# Patient Record
Sex: Female | Born: 1990
Health system: Southern US, Community
[De-identification: ages and names within clinical notes are randomized; demographics above are authoritative.]

## PROBLEM LIST (undated history)

## (undated) ENCOUNTER — Inpatient Hospital Stay (HOSPITAL_COMMUNITY): Payer: Self-pay

## (undated) ENCOUNTER — Inpatient Hospital Stay (HOSPITAL_COMMUNITY): Admission: RE | Payer: Medicaid Other | Source: Ambulatory Visit

## (undated) ENCOUNTER — Emergency Department: Admission: EM | Payer: Self-pay | Source: Home / Self Care

## (undated) DIAGNOSIS — Z8619 Personal history of other infectious and parasitic diseases: Secondary | ICD-10-CM

## (undated) DIAGNOSIS — O24419 Gestational diabetes mellitus in pregnancy, unspecified control: Secondary | ICD-10-CM

## (undated) DIAGNOSIS — Z202 Contact with and (suspected) exposure to infections with a predominantly sexual mode of transmission: Secondary | ICD-10-CM

## (undated) DIAGNOSIS — N760 Acute vaginitis: Secondary | ICD-10-CM

## (undated) DIAGNOSIS — B379 Candidiasis, unspecified: Secondary | ICD-10-CM

## (undated) DIAGNOSIS — N390 Urinary tract infection, site not specified: Secondary | ICD-10-CM

## (undated) DIAGNOSIS — N83209 Unspecified ovarian cyst, unspecified side: Secondary | ICD-10-CM

## (undated) DIAGNOSIS — R519 Headache, unspecified: Secondary | ICD-10-CM

## (undated) DIAGNOSIS — R87629 Unspecified abnormal cytological findings in specimens from vagina: Secondary | ICD-10-CM

## (undated) DIAGNOSIS — O020 Blighted ovum and nonhydatidiform mole: Secondary | ICD-10-CM

## (undated) DIAGNOSIS — B9689 Other specified bacterial agents as the cause of diseases classified elsewhere: Secondary | ICD-10-CM

## (undated) DIAGNOSIS — E282 Polycystic ovarian syndrome: Secondary | ICD-10-CM

## (undated) DIAGNOSIS — Z9889 Other specified postprocedural states: Secondary | ICD-10-CM

## (undated) HISTORY — DX: Gestational diabetes mellitus in pregnancy, unspecified control: O24.419

## (undated) HISTORY — PX: OTHER SURGICAL HISTORY: SHX169

## (undated) HISTORY — PX: LIPOSUCTION TRUNK: SUR833

## (undated) HISTORY — PX: DILATION AND CURETTAGE OF UTERUS: SHX78

---
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
## 2010-02-16 ENCOUNTER — Emergency Department (HOSPITAL_COMMUNITY)
Admission: EM | Admit: 2010-02-16 | Discharge: 2010-02-16 | Payer: Self-pay | Source: Home / Self Care | Admitting: Emergency Medicine

## 2010-02-19 ENCOUNTER — Inpatient Hospital Stay (HOSPITAL_COMMUNITY)
Admission: AD | Admit: 2010-02-19 | Discharge: 2010-02-19 | Payer: Self-pay | Source: Home / Self Care | Admitting: Obstetrics and Gynecology

## 2010-02-19 ENCOUNTER — Ambulatory Visit: Payer: Self-pay | Admitting: Obstetrics and Gynecology

## 2010-03-03 ENCOUNTER — Ambulatory Visit: Payer: Self-pay | Admitting: Nurse Practitioner

## 2010-03-03 ENCOUNTER — Inpatient Hospital Stay (HOSPITAL_COMMUNITY)
Admission: EM | Admit: 2010-03-03 | Discharge: 2010-03-03 | Payer: Self-pay | Source: Home / Self Care | Admitting: Obstetrics and Gynecology

## 2010-03-15 ENCOUNTER — Emergency Department (HOSPITAL_COMMUNITY)
Admission: EM | Admit: 2010-03-15 | Discharge: 2010-03-15 | Payer: Self-pay | Source: Home / Self Care | Admitting: Emergency Medicine

## 2010-03-29 ENCOUNTER — Inpatient Hospital Stay (HOSPITAL_COMMUNITY)
Admission: AD | Admit: 2010-03-29 | Discharge: 2010-03-29 | Payer: Self-pay | Source: Home / Self Care | Admitting: Obstetrics & Gynecology

## 2010-04-21 ENCOUNTER — Inpatient Hospital Stay (HOSPITAL_COMMUNITY)
Admission: AD | Admit: 2010-04-21 | Discharge: 2010-04-21 | Payer: Self-pay | Source: Home / Self Care | Attending: Obstetrics | Admitting: Obstetrics

## 2010-05-01 ENCOUNTER — Inpatient Hospital Stay (HOSPITAL_COMMUNITY)
Admission: AD | Admit: 2010-05-01 | Discharge: 2010-05-01 | Payer: Self-pay | Source: Home / Self Care | Attending: Obstetrics | Admitting: Obstetrics

## 2010-06-20 ENCOUNTER — Emergency Department (HOSPITAL_COMMUNITY): Payer: Medicaid Other

## 2010-06-20 ENCOUNTER — Emergency Department (HOSPITAL_COMMUNITY)
Admission: EM | Admit: 2010-06-20 | Discharge: 2010-06-21 | Disposition: A | Discharge status: Other Acute Inpatient Hospital | Payer: Medicaid Other | Source: Home / Self Care | Attending: Emergency Medicine | Admitting: Emergency Medicine

## 2010-06-20 DIAGNOSIS — M79609 Pain in unspecified limb: Secondary | ICD-10-CM | POA: Insufficient documentation

## 2010-06-20 DIAGNOSIS — O9989 Other specified diseases and conditions complicating pregnancy, childbirth and the puerperium: Secondary | ICD-10-CM | POA: Insufficient documentation

## 2010-06-20 DIAGNOSIS — R109 Unspecified abdominal pain: Secondary | ICD-10-CM | POA: Insufficient documentation

## 2010-06-20 DIAGNOSIS — R079 Chest pain, unspecified: Secondary | ICD-10-CM | POA: Insufficient documentation

## 2010-06-20 DIAGNOSIS — F411 Generalized anxiety disorder: Secondary | ICD-10-CM | POA: Insufficient documentation

## 2010-06-20 LAB — COMPREHENSIVE METABOLIC PANEL
ALT: 24 U/L (ref 0–35)
AST: 25 U/L (ref 0–37)
Albumin: 3.1 g/dL — ABNORMAL LOW (ref 3.5–5.2)
CO2: 21 mEq/L (ref 19–32)
Chloride: 108 mEq/L (ref 96–112)
Creatinine, Ser: 0.52 mg/dL (ref 0.4–1.2)
GFR calc Af Amer: >60 mL/min (ref >60)
GFR calc non Af Amer: >60 mL/min (ref >60)
Potassium: 3.2 mEq/L — ABNORMAL LOW (ref 3.5–5.1)
Sodium: 136 mEq/L (ref 135–145)
Total Bilirubin: 0.3 mg/dL (ref 0.3–1.2)

## 2010-06-20 LAB — URINALYSIS, ROUTINE W REFLEX MICROSCOPIC
Bilirubin Urine: NEGATIVE
Protein, ur: NEGATIVE mg/dL
Urine Glucose, Fasting: NEGATIVE mg/dL
Urobilinogen, UA: 0.2 mg/dL (ref 0.0–1.0)

## 2010-06-20 LAB — DIFFERENTIAL
Basophils Absolute: 0 10*3/uL (ref 0.0–0.1)
Basophils Relative: 0 % (ref 0–1)
Eosinophils Absolute: 0.1 10*3/uL (ref 0.0–0.7)
Monocytes Relative: 8 % (ref 3–12)
Neutro Abs: 8.2 10*3/uL — ABNORMAL HIGH (ref 1.7–7.7)
Neutrophils Relative %: 67 % (ref 43–77)

## 2010-06-20 LAB — URINE MICROSCOPIC-ADD ON

## 2010-06-20 LAB — LACTIC ACID, PLASMA: Lactic Acid, Venous: 1.2 mmol/L (ref 0.5–2.2)

## 2010-06-20 LAB — CBC
Hemoglobin: 11.3 g/dL — ABNORMAL LOW (ref 12.0–15.0)
MCH: 28.2 pg (ref 26.0–34.0)
Platelets: 256 10*3/uL (ref 150–400)
RBC: 4.01 MIL/uL (ref 3.87–5.11)
WBC: 12.3 10*3/uL — ABNORMAL HIGH (ref 4.0–10.5)

## 2010-06-21 ENCOUNTER — Inpatient Hospital Stay (HOSPITAL_COMMUNITY)
Admission: AD | Admit: 2010-06-21 | Discharge: 2010-06-21 | Disposition: A | Discharge status: Home / Self Care | Payer: Medicaid Other | Source: Ambulatory Visit | Attending: Obstetrics | Admitting: Obstetrics

## 2010-06-21 ENCOUNTER — Inpatient Hospital Stay (HOSPITAL_COMMUNITY): Admission: AD | Admit: 2010-06-21 | Payer: Self-pay | Source: Ambulatory Visit | Admitting: Obstetrics

## 2010-06-21 DIAGNOSIS — R109 Unspecified abdominal pain: Secondary | ICD-10-CM

## 2010-06-21 DIAGNOSIS — O99891 Other specified diseases and conditions complicating pregnancy: Secondary | ICD-10-CM | POA: Insufficient documentation

## 2010-06-21 DIAGNOSIS — Y92410 Unspecified street and highway as the place of occurrence of the external cause: Secondary | ICD-10-CM | POA: Insufficient documentation

## 2010-06-21 DIAGNOSIS — M549 Dorsalgia, unspecified: Secondary | ICD-10-CM

## 2010-06-21 DIAGNOSIS — O9989 Other specified diseases and conditions complicating pregnancy, childbirth and the puerperium: Secondary | ICD-10-CM

## 2010-06-21 DIAGNOSIS — M542 Cervicalgia: Secondary | ICD-10-CM

## 2010-06-21 LAB — TYPE AND SCREEN: ABO/RH(D): A POS

## 2010-06-23 ENCOUNTER — Inpatient Hospital Stay (HOSPITAL_COMMUNITY)
Admission: AD | Admit: 2010-06-23 | Discharge: 2010-06-24 | Disposition: A | Discharge status: Home / Self Care | Payer: Medicaid Other | Source: Ambulatory Visit | Attending: Obstetrics & Gynecology | Admitting: Obstetrics & Gynecology

## 2010-06-23 ENCOUNTER — Inpatient Hospital Stay (HOSPITAL_COMMUNITY): Payer: Medicaid Other

## 2010-06-23 DIAGNOSIS — R109 Unspecified abdominal pain: Secondary | ICD-10-CM | POA: Insufficient documentation

## 2010-06-23 DIAGNOSIS — O9989 Other specified diseases and conditions complicating pregnancy, childbirth and the puerperium: Secondary | ICD-10-CM

## 2010-06-23 DIAGNOSIS — O99891 Other specified diseases and conditions complicating pregnancy: Secondary | ICD-10-CM | POA: Insufficient documentation

## 2010-06-23 LAB — URINE MICROSCOPIC-ADD ON

## 2010-06-23 LAB — URINALYSIS, ROUTINE W REFLEX MICROSCOPIC
Specific Gravity, Urine: 1.02 (ref 1.005–1.030)
Urine Glucose, Fasting: NEGATIVE mg/dL

## 2010-06-24 LAB — URINE CULTURE

## 2010-06-26 IMAGING — CT CT ABD-PELV W/O CM
2 of 4 series · 17 of 46 positions shown, 19 images · non-contrast
Comparison: None.

CLINICAL DATA: Abdominal pain.  Diarrhea. Reported history of
polycystic ovarian disease

CT ABDOMEN AND PELVIS WITHOUT CONTRAST
TECHNIQUE: Multidetector CT imaging of the abdomen and pelvis was
performed following the standard protocol without intravenous
contrast.

[Series 2: abd/pelv w/o 5.0 b31f st · axial · non-contrast · 0.83mm/px · z∈[-576,-176]mm · 14 of 88 slices shown, 16 images]
[im 4/88  soft-tissue]
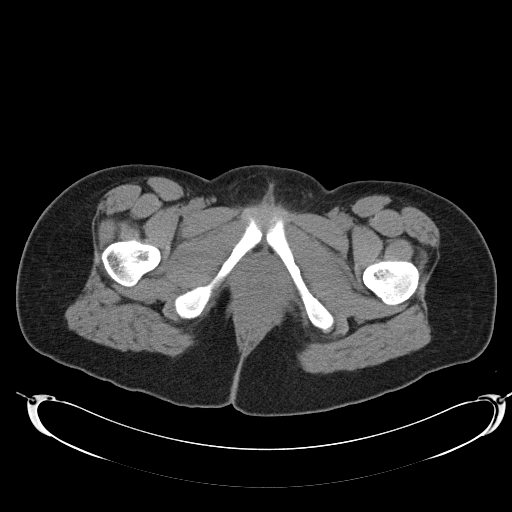
[im 4/88  bone]
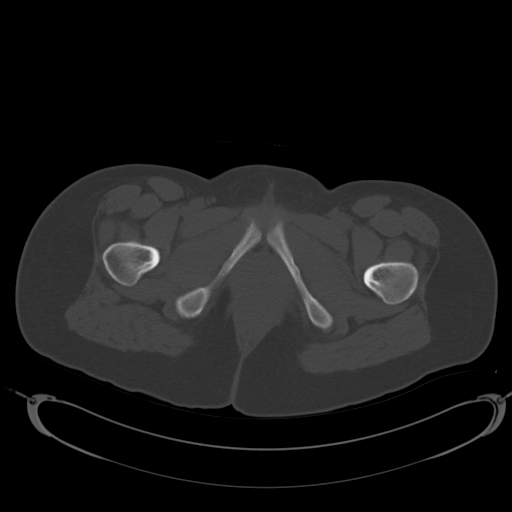
[im 11/88  soft-tissue]
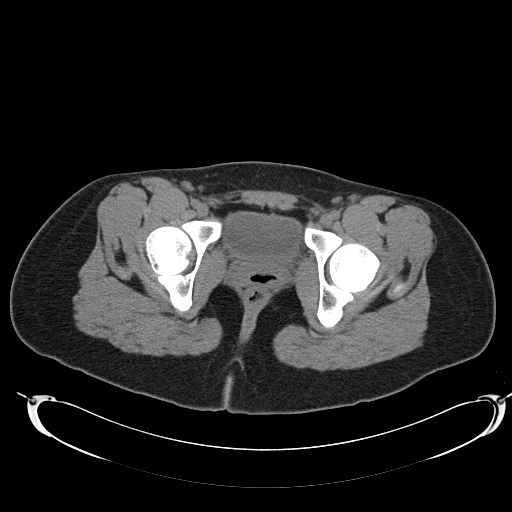
[im 18/88  soft-tissue]
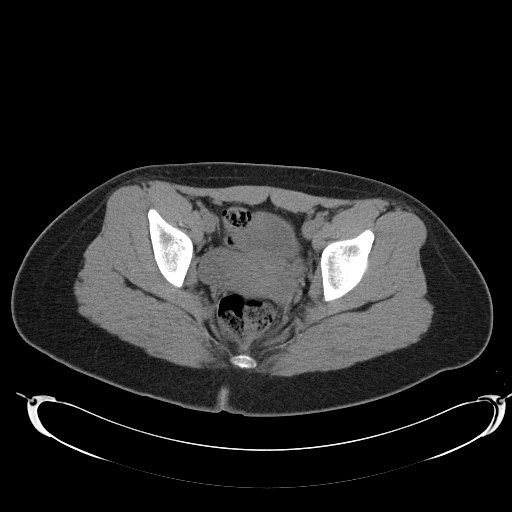
[im 25/88  soft-tissue]
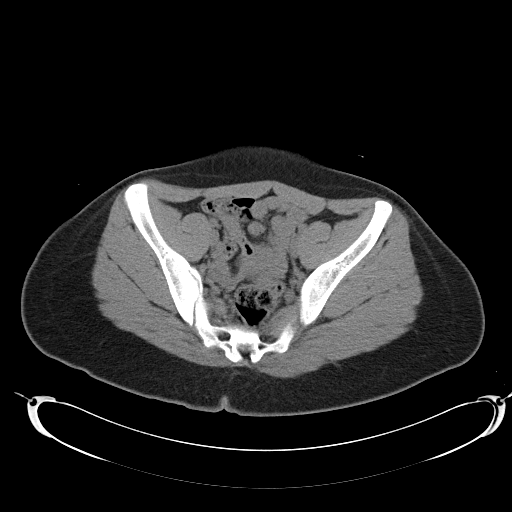
[im 28/88  soft-tissue]
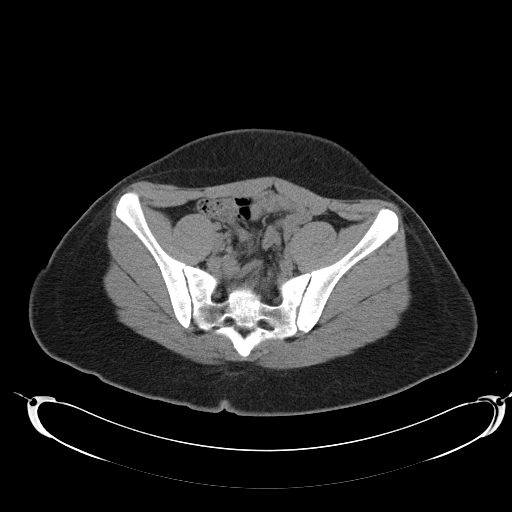
[im 35/88  soft-tissue]
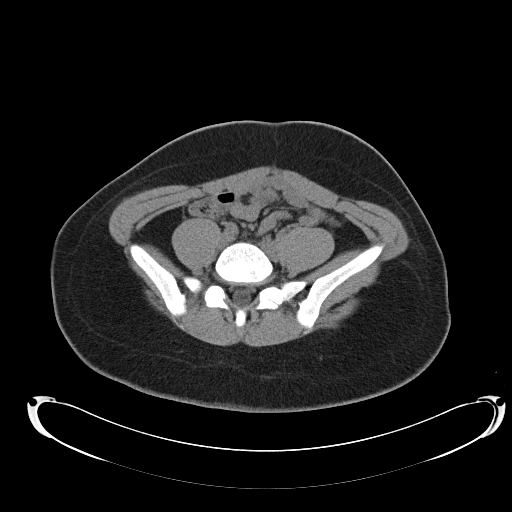
[im 42/88  soft-tissue]
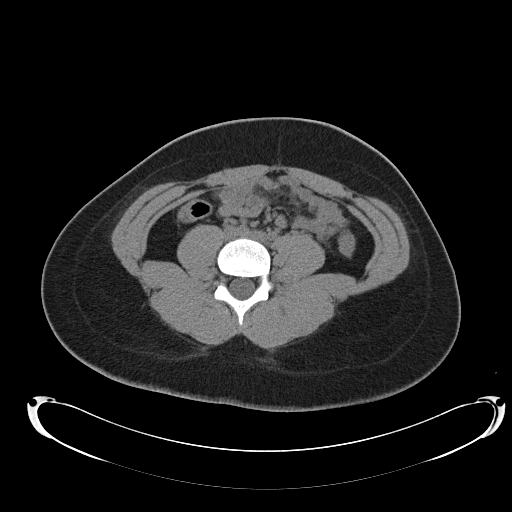
[im 46/88  soft-tissue]
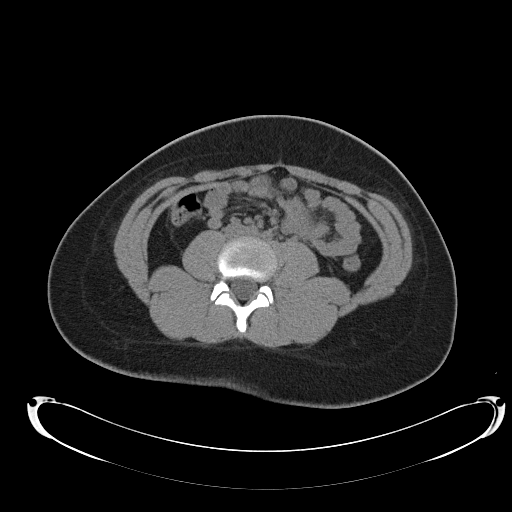
[im 53/88  soft-tissue]
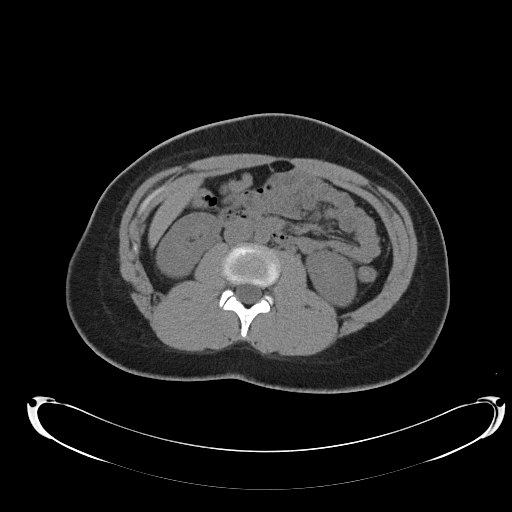
[im 53/88  bone]
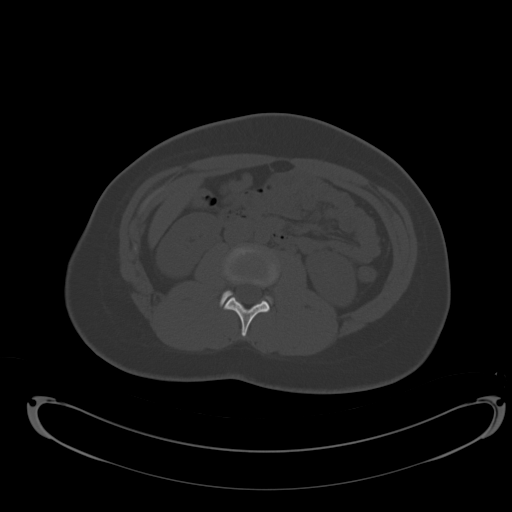
[im 60/88  soft-tissue]
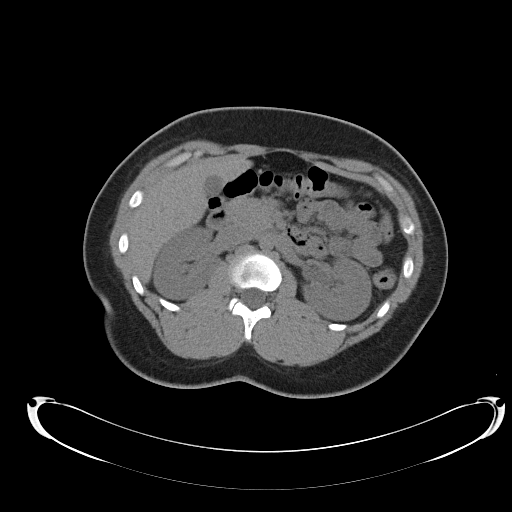
[im 67/88  soft-tissue]
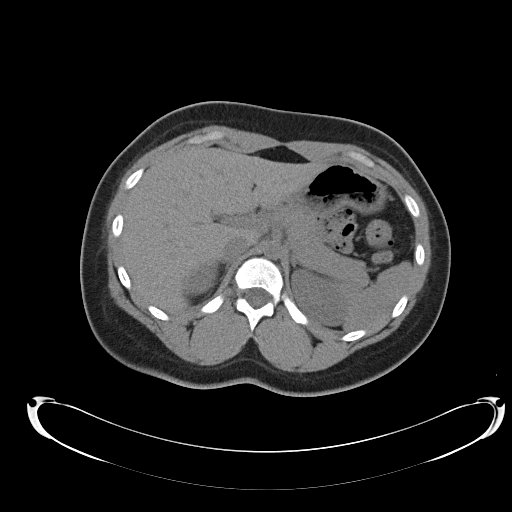
[im 70/88  soft-tissue]
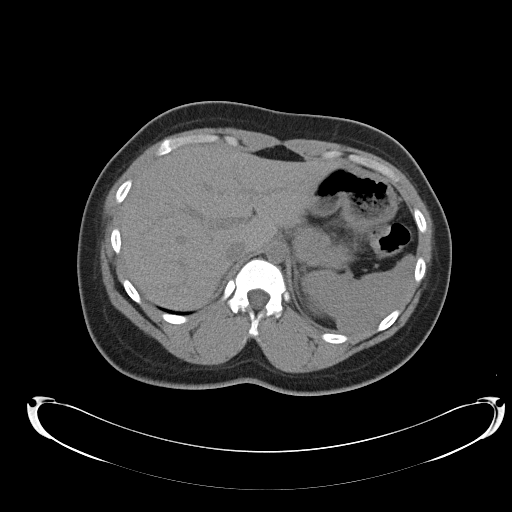
[im 77/88  soft-tissue]
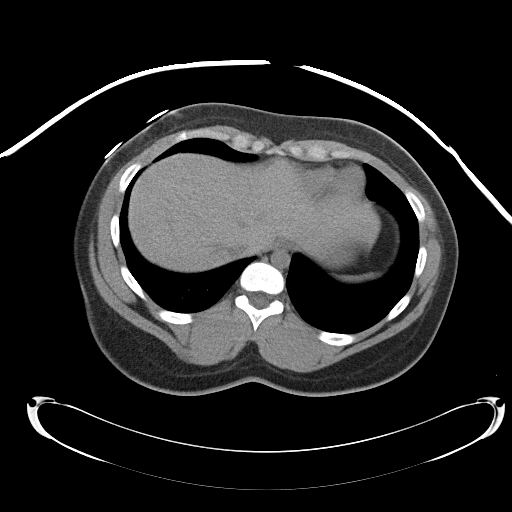
[im 84/88  soft-tissue]
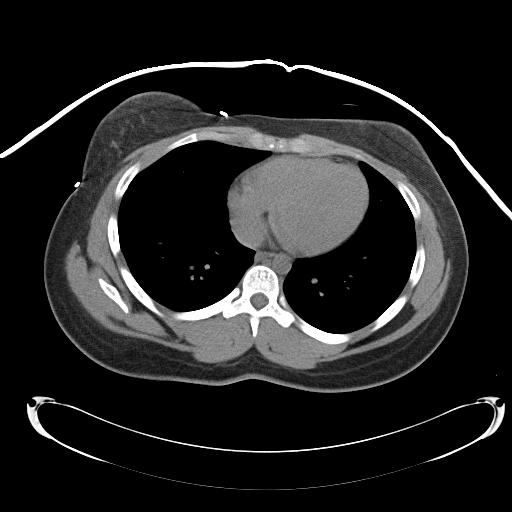

[Series 5: abd/pelv w/o 2.0 spo cor thins · coronal · non-contrast · 1.04mm/px · 3 of 98 slices shown]
[im 33/98  soft-tissue]
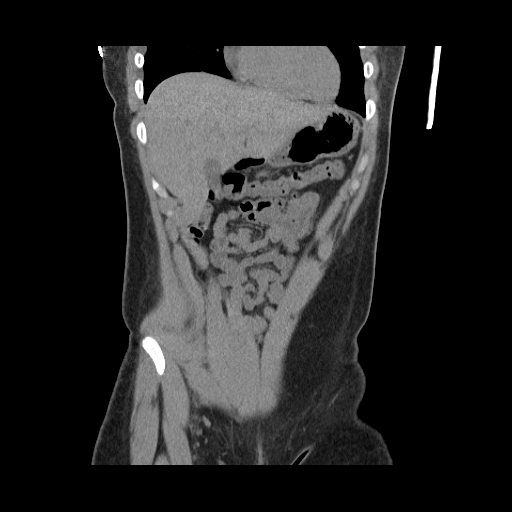
[im 44/98  soft-tissue]
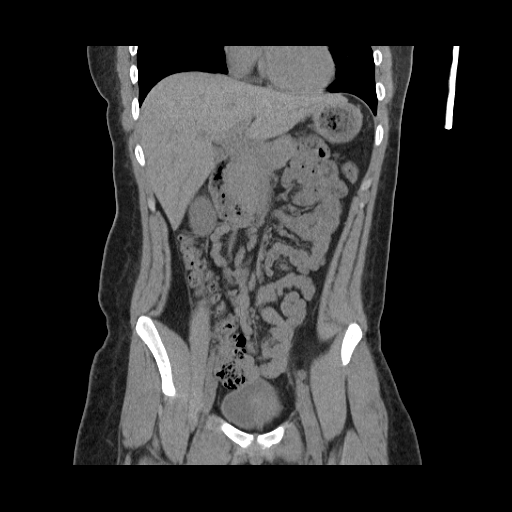
[im 54/98  soft-tissue]
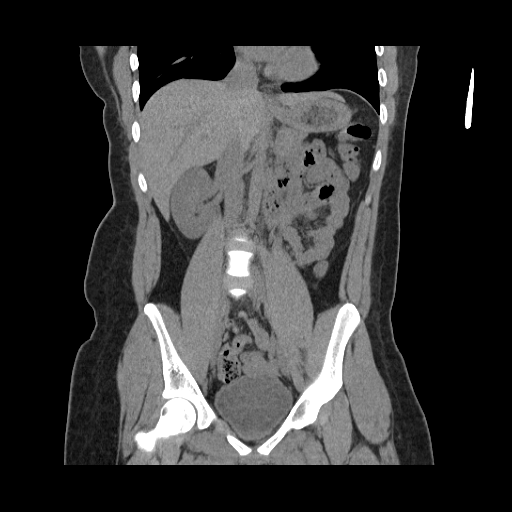

[17 of 46 positions shown; findings below may reference images not displayed]

FINDINGS: Normal liver, spleen, pancreas, adrenal glands.

No focal renal abnormality.  No free fluid.  No mass. No evidence
of bowel wall thickening.  Suggestion that both ovaries have
somewhat of a prominent globular like configuration with possibly
multiple follicles raises a question of polycystic ovarian
syndrome.  No free pelvic fluid.  Uterus unremarkable.  Bladder
unremarkable.

Bony structures intact.
IMPRESSION: No acute findings.  Findings raise a question of polycystic ovarian
syndrome.

## 2010-07-11 ENCOUNTER — Inpatient Hospital Stay (HOSPITAL_COMMUNITY)
Admission: AD | Admit: 2010-07-11 | Discharge: 2010-07-11 | Disposition: A | Discharge status: Home / Self Care | Payer: Medicaid Other | Source: Ambulatory Visit | Attending: Obstetrics | Admitting: Obstetrics

## 2010-07-11 DIAGNOSIS — O368190 Decreased fetal movements, unspecified trimester, not applicable or unspecified: Secondary | ICD-10-CM | POA: Insufficient documentation

## 2010-07-11 LAB — URINALYSIS, ROUTINE W REFLEX MICROSCOPIC
Glucose, UA: NEGATIVE mg/dL
Specific Gravity, Urine: 1.02 (ref 1.005–1.030)

## 2010-07-11 LAB — URINE MICROSCOPIC-ADD ON

## 2010-07-21 LAB — URINALYSIS, ROUTINE W REFLEX MICROSCOPIC
Bilirubin Urine: NEGATIVE
Ketones, ur: NEGATIVE mg/dL
Nitrite: NEGATIVE
Urobilinogen, UA: 1 mg/dL (ref 0.0–1.0)
pH: 6.5 (ref 5.0–8.0)

## 2010-07-21 LAB — CBC
Hemoglobin: 11.8 g/dL — ABNORMAL LOW (ref 12.0–15.0)
MCH: 28.1 pg (ref 26.0–34.0)
MCHC: 33.7 g/dL (ref 30.0–36.0)
MCV: 83.3 fL (ref 78.0–100.0)

## 2010-07-21 LAB — URINE CULTURE

## 2010-07-21 LAB — URINE MICROSCOPIC-ADD ON

## 2010-07-21 LAB — WET PREP, GENITAL

## 2010-07-22 LAB — URINALYSIS, ROUTINE W REFLEX MICROSCOPIC
Bilirubin Urine: NEGATIVE
Glucose, UA: NEGATIVE mg/dL
Ketones, ur: NEGATIVE mg/dL
Nitrite: NEGATIVE
Nitrite: NEGATIVE
Protein, ur: NEGATIVE mg/dL
Specific Gravity, Urine: 1.028 (ref 1.005–1.030)
Specific Gravity, Urine: 1.02 (ref 1.005–1.030)
Urobilinogen, UA: 0.2 mg/dL (ref 0.0–1.0)
pH: 6 (ref 5.0–8.0)

## 2010-07-22 LAB — URINE MICROSCOPIC-ADD ON

## 2010-07-22 LAB — URINE CULTURE
Colony Count: 75000
Culture  Setup Time: 201111191713

## 2010-07-23 LAB — URINE CULTURE
Colony Count: 80000
Culture  Setup Time: 201110250035

## 2010-07-23 LAB — URINE MICROSCOPIC-ADD ON

## 2010-07-23 LAB — URINALYSIS, ROUTINE W REFLEX MICROSCOPIC
Bilirubin Urine: NEGATIVE
Ketones, ur: NEGATIVE mg/dL
Nitrite: NEGATIVE
Protein, ur: NEGATIVE mg/dL
Urobilinogen, UA: 0.2 mg/dL (ref 0.0–1.0)

## 2010-07-23 LAB — GC/CHLAMYDIA PROBE AMP, GENITAL: GC Probe Amp, Genital: NEGATIVE

## 2010-07-23 LAB — WET PREP, GENITAL: Trich, Wet Prep: NONE SEEN

## 2010-07-24 LAB — HCG, QUANTITATIVE, PREGNANCY: hCG, Beta Chain, Quant, S: 4810 m[IU]/mL — ABNORMAL HIGH (ref <5)

## 2010-07-24 LAB — CBC
Hemoglobin: 13.2 g/dL (ref 12.0–15.0)
MCH: 29.5 pg (ref 26.0–34.0)
MCHC: 33.3 g/dL (ref 30.0–36.0)
MCV: 88.5 fL (ref 78.0–100.0)

## 2010-07-24 LAB — URINALYSIS, ROUTINE W REFLEX MICROSCOPIC
Glucose, UA: NEGATIVE mg/dL
Nitrite: NEGATIVE
Protein, ur: NEGATIVE mg/dL
pH: 6 (ref 5.0–8.0)

## 2010-07-24 LAB — WET PREP, GENITAL: Yeast Wet Prep HPF POC: NONE SEEN

## 2010-07-24 LAB — URINE CULTURE
Colony Count: 40000
Culture  Setup Time: 201110130153

## 2010-07-24 LAB — GC/CHLAMYDIA PROBE AMP, GENITAL
Chlamydia, DNA Probe: NEGATIVE
GC Probe Amp, Genital: NEGATIVE

## 2010-07-24 LAB — URINE MICROSCOPIC-ADD ON

## 2010-07-27 LAB — DIFFERENTIAL
Basophils Absolute: 0.1 10*3/uL (ref 0.0–0.1)
Basophils Relative: 1 % (ref 0–1)
Eosinophils Relative: 2 % (ref 0–5)
Monocytes Absolute: 0.9 10*3/uL (ref 0.1–1.0)
Monocytes Relative: 10 % (ref 3–12)
Neutro Abs: 5.1 10*3/uL (ref 1.7–7.7)

## 2010-07-27 LAB — CBC
Platelets: 337 10*3/uL (ref 150–400)
RDW: 16.4 % — ABNORMAL HIGH (ref 11.5–15.5)
WBC: 8.9 10*3/uL (ref 4.0–10.5)

## 2010-07-27 LAB — COMPREHENSIVE METABOLIC PANEL
AST: 16 U/L (ref 0–37)
Albumin: 3.6 g/dL (ref 3.5–5.2)
Alkaline Phosphatase: 86 U/L (ref 39–117)
Chloride: 104 mEq/L (ref 96–112)
GFR calc Af Amer: >60 mL/min (ref >60)
Potassium: 4 mEq/L (ref 3.5–5.1)
Sodium: 135 mEq/L (ref 135–145)
Total Bilirubin: 0.4 mg/dL (ref 0.3–1.2)
Total Protein: 7.1 g/dL (ref 6.0–8.3)

## 2010-07-27 LAB — POCT PREGNANCY, URINE: Preg Test, Ur: NEGATIVE

## 2010-07-28 LAB — POCT URINALYSIS DIP (DEVICE)
Glucose, UA: NEGATIVE mg/dL
Ketones, ur: NEGATIVE mg/dL
Protein, ur: NEGATIVE mg/dL
Specific Gravity, Urine: 1.02 (ref 1.005–1.030)
Urobilinogen, UA: 0.2 mg/dL (ref 0.0–1.0)

## 2010-07-28 LAB — WET PREP, GENITAL
Trich, Wet Prep: NONE SEEN
Yeast Wet Prep HPF POC: NONE SEEN

## 2010-07-28 LAB — POCT PREGNANCY, URINE: Preg Test, Ur: NEGATIVE

## 2010-08-03 LAB — URINALYSIS, ROUTINE W REFLEX MICROSCOPIC
Glucose, UA: NEGATIVE mg/dL
Ketones, ur: NEGATIVE mg/dL
Leukocytes, UA: NEGATIVE
Nitrite: NEGATIVE
Specific Gravity, Urine: 1.027 (ref 1.005–1.030)
pH: 7 (ref 5.0–8.0)

## 2010-08-03 LAB — URINE MICROSCOPIC-ADD ON

## 2010-08-03 LAB — POCT PREGNANCY, URINE: Preg Test, Ur: NEGATIVE

## 2010-08-13 LAB — WET PREP, GENITAL
Trich, Wet Prep: NONE SEEN
Yeast Wet Prep HPF POC: NONE SEEN

## 2010-08-13 LAB — URINE CULTURE: Colony Count: 15000

## 2010-08-13 LAB — POCT URINALYSIS DIP (DEVICE)
Ketones, ur: NEGATIVE mg/dL
Protein, ur: NEGATIVE mg/dL
Urobilinogen, UA: 0.2 mg/dL (ref 0.0–1.0)
pH: 7.5 (ref 5.0–8.0)

## 2010-08-13 LAB — POCT PREGNANCY, URINE: Preg Test, Ur: NEGATIVE

## 2010-08-13 LAB — GC/CHLAMYDIA PROBE AMP, GENITAL: GC Probe Amp, Genital: NEGATIVE

## 2010-08-15 LAB — GLUCOSE, CAPILLARY: Glucose-Capillary: 63 mg/dL — ABNORMAL LOW (ref 70–99)

## 2010-08-15 LAB — URINE MICROSCOPIC-ADD ON

## 2010-08-15 LAB — POCT I-STAT, CHEM 8
HCT: 38 % (ref 36.0–46.0)
Hemoglobin: 12.9 g/dL (ref 12.0–15.0)
Potassium: 3.8 mEq/L (ref 3.5–5.1)
Sodium: 137 mEq/L (ref 135–145)
TCO2: 24 mmol/L (ref 0–100)

## 2010-08-15 LAB — URINALYSIS, ROUTINE W REFLEX MICROSCOPIC
Bilirubin Urine: NEGATIVE
Hgb urine dipstick: NEGATIVE
Ketones, ur: NEGATIVE mg/dL
Protein, ur: NEGATIVE mg/dL
Specific Gravity, Urine: 1.029 (ref 1.005–1.030)
Urobilinogen, UA: 0.2 mg/dL (ref 0.0–1.0)
pH: 6.5 (ref 5.0–8.0)

## 2010-08-15 LAB — POCT PREGNANCY, URINE: Preg Test, Ur: NEGATIVE

## 2010-08-16 LAB — WET PREP, GENITAL
Trich, Wet Prep: NONE SEEN
Yeast Wet Prep HPF POC: NONE SEEN

## 2010-08-16 LAB — URINALYSIS, ROUTINE W REFLEX MICROSCOPIC
Bilirubin Urine: NEGATIVE
Glucose, UA: NEGATIVE mg/dL
Ketones, ur: NEGATIVE mg/dL
Nitrite: NEGATIVE
Protein, ur: NEGATIVE mg/dL

## 2010-08-16 LAB — GC/CHLAMYDIA PROBE AMP, GENITAL: Chlamydia, DNA Probe: NEGATIVE

## 2010-08-16 LAB — URINE CULTURE: Colony Count: 70000

## 2010-08-16 LAB — URINE MICROSCOPIC-ADD ON

## 2010-08-17 LAB — RAPID STREP SCREEN (MED CTR MEBANE ONLY): Streptococcus, Group A Screen (Direct): NEGATIVE

## 2010-08-19 LAB — WET PREP, GENITAL

## 2010-08-19 LAB — CBC
MCV: 87.3 fL (ref 78.0–100.0)
Platelets: 339 10*3/uL (ref 150–400)
RBC: 3.74 MIL/uL — ABNORMAL LOW (ref 3.87–5.11)
WBC: 6.6 10*3/uL (ref 4.0–10.5)

## 2010-08-19 LAB — GC/CHLAMYDIA PROBE AMP, GENITAL
Chlamydia, DNA Probe: NEGATIVE
GC Probe Amp, Genital: NEGATIVE

## 2010-08-20 ENCOUNTER — Inpatient Hospital Stay (HOSPITAL_COMMUNITY)
Admission: AD | Admit: 2010-08-20 | Discharge: 2010-08-21 | Disposition: A | Discharge status: Home / Self Care | Payer: Medicaid Other | Source: Ambulatory Visit | Attending: Obstetrics | Admitting: Obstetrics

## 2010-08-20 DIAGNOSIS — O9989 Other specified diseases and conditions complicating pregnancy, childbirth and the puerperium: Secondary | ICD-10-CM

## 2010-08-20 DIAGNOSIS — M549 Dorsalgia, unspecified: Secondary | ICD-10-CM | POA: Insufficient documentation

## 2010-08-20 DIAGNOSIS — O99891 Other specified diseases and conditions complicating pregnancy: Secondary | ICD-10-CM | POA: Insufficient documentation

## 2010-08-20 LAB — URINALYSIS, ROUTINE W REFLEX MICROSCOPIC
Bilirubin Urine: NEGATIVE
Leukocytes, UA: NEGATIVE
Nitrite: NEGATIVE
Specific Gravity, Urine: 1.015 (ref 1.005–1.030)
Urobilinogen, UA: 0.2 mg/dL (ref 0.0–1.0)
pH: 7 (ref 5.0–8.0)

## 2010-08-20 LAB — URINE MICROSCOPIC-ADD ON

## 2010-08-20 LAB — WET PREP, GENITAL: Yeast Wet Prep HPF POC: NONE SEEN

## 2010-08-21 LAB — GC/CHLAMYDIA PROBE AMP, GENITAL
Chlamydia, DNA Probe: NEGATIVE
GC Probe Amp, Genital: NEGATIVE

## 2010-09-09 ENCOUNTER — Inpatient Hospital Stay (HOSPITAL_COMMUNITY)
Admission: AD | Admit: 2010-09-09 | Discharge: 2010-09-10 | Disposition: A | Discharge status: Home / Self Care | Payer: Medicaid Other | Source: Ambulatory Visit | Attending: Obstetrics | Admitting: Obstetrics

## 2010-09-09 DIAGNOSIS — O99891 Other specified diseases and conditions complicating pregnancy: Secondary | ICD-10-CM | POA: Insufficient documentation

## 2010-09-09 DIAGNOSIS — R109 Unspecified abdominal pain: Secondary | ICD-10-CM

## 2010-09-09 DIAGNOSIS — O9989 Other specified diseases and conditions complicating pregnancy, childbirth and the puerperium: Secondary | ICD-10-CM

## 2010-09-10 LAB — URINALYSIS, ROUTINE W REFLEX MICROSCOPIC
Nitrite: NEGATIVE
Protein, ur: NEGATIVE mg/dL
Specific Gravity, Urine: 1.02 (ref 1.005–1.030)
Urobilinogen, UA: 0.2 mg/dL (ref 0.0–1.0)

## 2010-09-10 LAB — WET PREP, GENITAL: Yeast Wet Prep HPF POC: NONE SEEN

## 2010-09-10 LAB — URINE MICROSCOPIC-ADD ON

## 2010-09-23 NOTE — Group Therapy Note (Signed)
NAME:  Anita Allen, Anita Allen               ACCOUNT NO.:  [id number]   MEDICAL RECORD NO.:  [id number]          PATIENT TYPE:  WOC   LOCATION:  [facility]                   FACILITY:  [facility]   PHYSICIAN:  Jaynie Collins, MD     DATE OF BIRTH:  Jan 08, 1991   DATE OF SERVICE:                                  CLINIC NOTE   The patient is an 20 year old gravida 1, para 0-0-1-0, who is here for  followup after a visit on October 19, 2008, for abdominal pain and  irregular menstrual cycles.  She did have an ultrasound on May 2010,  which showed polycystic ovaries and given that she does have these  polycystic ovaries and irregular menstrual periods and on physical  examination has signs of hyperandrogenism, she was diagnosed with  polycystic ovarian syndrome.  The patient was given options of how to  treat her PCOS and she is very against using any form of hormonal  therapy and so she was started on metformin 500 b.i.d.  A comprehensive  metabolic panel that was done on that day was negative, and her liver  function tests were normal.  On followup today, the patient does report  continued pain.  She is also requesting a physical examination for her  pain and for further workup.  She does report a history of Chlamydia in  the past and wants to see if this has recurred.   No interval change in medical history.   PHYSICAL EXAMINATION:  GENERAL:  The patient is afebrile.  No apparent  distress.  VITAL SIGNS:  Stable as written in her physical chart.  ABDOMEN:  Soft, nontender, nondistended.  PELVIC:  Normal external female genitalia.  Pink, well-rugated vagina,  small amount of thin white discharge noted, no odor.  A sample was taken  for wet prep.  Her cervix was noted to be friable and erythematous.  No  active discharge was noted.  A gonorrhea and Chlamydia probe were  obtained.  On bimanual exam, the patient does endorse cervical motion  tenderness that she rates at 10/10.  However, there is no  chandelier  sign or any signs of extreme CMP, she just appeared uncomfortable during  the examination on the uterine and adnexal palpation, no abnormal masses  palpated.   ASSESSMENT AND PLAN:  The patient is an 20 year old gravida 1, para 0-0-  1-0 with polycystic ovarian syndrome and pelvic pain.  The patient was  counseled about using hormonal therapy, which helped the polycystic  ovarian syndrome and her pain, but she declines this method.  She wants  to continue on the metformin for now, and she was given a prescription  for diclofenac DR 75 mg p.o. b.i.d. p.r.n. pain.  She was told if she  changes her mind that she can come back, and we will give her this  hormonal medications.  She is sexually active and only uses condoms for  contraception.  The patient does show evidence of cervicitis on  examination.  A gonorrhea, Chlamydia probe was sent and we will follow  up results, a wet prep was also sent to rule out on  Trichomonas or other  reasons for her cervicitis.  The patient was also told about the  Gardasil vaccine series.  She did request a Pap smear today, but she was  told that given that she was 20 year old, she did not need to have a Pap  smear until she will be age 58, but a Gardasil vaccine series was  recommended.  She is self-pay as well as she was referred to go to the  [facility] and see if she can qualify for  receiving a free vaccination series.  The patient is to follow up in 2  months for PCOS and also followup for chronic pelvic pain.  At that  visit, she will have a repeat liver  function test panel drawn just to make sure that the metformin is not  adversely affecting her liver and also her kidneys.  We will recommend a  comprehensive metabolic panel at that visit.            ______________________________  Jaynie Collins, MD     UA/MEDQ  D:  11/02/2008  T:  11/03/2008  Job:  [id number]

## 2010-09-23 NOTE — Group Therapy Note (Signed)
NAME:  Anita Allen, Anita Allen               ACCOUNT NO.:  [id number]   MEDICAL RECORD NO.:  [id number]          PATIENT TYPE:  WOC   LOCATION:  [facility]                   FACILITY:  [facility]   PHYSICIAN:  Dorthula Perfect, MD     DATE OF BIRTH:  1990/07/16   DATE OF SERVICE:  10/19/2008                                  CLINIC NOTE   REASON FOR VISIT:  Followup from the MAU for dysfunctional uterine  bleeding.   HISTORY OF PRESENT ILLNESS:  The patient is an 20 year old gravida 1,  para 0-0-1-0, who was seen in the MAU approximately 2 weeks ago for a  continued 1-2 month period of mild to moderate bleeding and mild to  moderate cramping.  The patient relates a fairly long history of  irregular menstrual cycles that are heavy at times and sometimes last 10-  14 days.  She notes that she has a steady boyfriend and regular sexual  intercourse.  She does not use any type of contraception except for a  pull-out method.  She has been pregnant one time in April 2009 at which  she has lost abortion.  Ultrasound was performed during her visit in the  MAU which revealed normal myometrium, thin endometrial lining, and  ovaries which demonstrate subcentimeter follicles diffusely, slightly  prominent, and suggestive of polycystic ovarian syndrome.  On further  questioning, the patient does relate that she does have excess body air  and she is slightly overweight.   PAST MEDICAL HISTORY:  None.   PAST SURGICAL HISTORY:  None.   PAST GYNECOLOGIC HISTORY:  She has no history of Pap smear,   SOCIAL HISTORY:  She does use tobacco and alcohol.   REVIEW OF SYSTEMS:  As per history of present illness.   PHYSICAL EXAMINATION:  VITAL SIGNS:  Stable.  GENERAL:  She is afebrile.  She is healthy-appearing in no acute  distress.  The remainder of her exam is deferred as she is no longer  bleeding.   ASSESSMENT AND PLAN:  Polycystic ovarian syndrome.  Given the patient's  hirsutism, not getting pregnant despite  not using any significant form  of contraception as well as her been somewhat overweight and a recent  findings from ultrasound has got possibility the patient has polycystic  ovarian syndrome and which would certainly explain some of the irregular  bleeding that she has had.  I recommended that we try some contraceptive  pills or Depo or an IUD.  However, the patient is not interested in  birth control at time mostly due to her fears of gaining weight.  Although, had a lengthy discussion about her diet and exercise program  which both are sub optimum.  Additionally, I offered her a course of  metformin and counseled her extensively on the uses of medicine.  This  small  risk of hypoglycemia and now to treat that if it were to occur.  We will  start her on metformin 500 mg b.i.d.  She will follow up in 1-2 months.  A CMP was done today and we will repeat that at her followup.     ______________________________  Odie Sera, D.O.    ______________________________  Dorthula Perfect, MD    MC/MEDQ  D:  10/19/2008  T:  10/20/2008  Job:  [id number]

## 2010-09-23 NOTE — Group Therapy Note (Signed)
NAME:  Anita Allen, Anita Allen               ACCOUNT NO.:  [id number]   MEDICAL RECORD NO.:  [id number]          PATIENT TYPE:  WOC   LOCATION:  [facility]                   FACILITY:  [facility]   PHYSICIAN:  Argentina Donovan, MD        DATE OF BIRTH:  January 14, 1991   DATE OF SERVICE:                                  CLINIC NOTE   CHIEF COMPLAINT:  Missed period, a questionable pregnancy.   HISTORY OF PRESENT ILLNESS:  This is a 20 year old female who was seen  last month in January at [facility], because she missed her  period in January.  The patient states that her pregnancy test was  negative at that time and that [facility] referred her to  this clinic, to discuss her missed period.  The patient states that she  wound up having her menstrual period this month in February, on February  7th, and it was normal at first, however, and then became abnormal.  When asked about this, she states that her period in February lasted for  8 days, then went off, returned for a day, stopped, and then returned  for another day.  The bleeding was not heavy, and she denies cramping.  Up until January, her menstrual cycles have been normal for her,  averaging just over every 3 weeks, with moderate bleeding and no  cramping.  The patient thinks she is pregnant now, because she has  gained, she says 15 pounds over the past month.  She has also had  unprotected sex, because she is allergic to latex and can not use  condoms.  The patient states that she believes she is pregnant, because  her partner was not able to withdraw in time during sexual intercourse.  She also wants STD testings today.  The patient does not want any form  of birth control including pills, IUDs, Depo Provera,Nuva Ring, etc.  She states she does not want anything that will interfere with her  normal body cycles, and she understands the risk of pregnancy.  The  patient states that she will get an abortion, if she does indeed  get  pregnant.  Please note, the mother was present inside the room during  this interview, because when I asked the mother to leave the room, the  patient wanted her to stay and was comfortable with the conversations.  The patient has been sexually active for 3 years, the first intercourse  when she first turned 33.  She is almost 17 now.   PAST MEDICAL HISTORY:  The patient is healthy.   GYNECOLOGICAL HISTORY:  Never had a Pap smear.   OBSTETRIC HISTORY:  Never been pregnant.   SOCIAL HISTORY:  The patient smokes marijuana, though she quit 2 to 3  weeks ago.  She does not drink alcohol, does not use drugs except for  marijuana.   REVIEW OF SYSTEMS:  The patient complains of some shortness of breath  when she is running, occasional dizziness and muscle aches, otherwise  see HPI.  There was a cream-colored vaginal discharge.   FAMILY HISTORY:  Noncontributory.  PAST SURGICAL HISTORY:  None.   MEDICATIONS:  None.   PHYSICAL EXAMINATION:  VITAL SIGNS:  As noted in chart.  GENERAL:  Not in acute stress.  NECK:  No thyromegaly.  ABDOMEN:  Soft, nontender.  NO masses.  EXTREMITIES:  No edema, 2+ pulses.  HEENT:  Extraocular muscles intact.  GENITOURINARY:  Cervix is without gross lesions.  There are no masses  palpable, bimanual exam normal.  There is a whitish cream-colored  discharge present in the vaginal vault.   Please note:  The physical exam was somewhat abbreviated, as the  patient's mother and patient had to leave due to time constraints,  unable to do breast exam.   ASSESSMENT:  This is a 20 year old female with amenorrhea for one month  and abnormal periods, questionable pregnancy.  1. Amenorrhea:  The patient only had one month with a missed period;      however, her last period was not normal.  Therefore, is concerning      for a pregnancy.  Given the facts that it would only be about 3      weeks since her last period, we will go ahead and get a serum       pregnancy test.  This was discussed with Dr. Okey Dupre, who feels that      the urine may not be quite as accurate at this point in time.      Results are pending.  2. Vaginal discharge:  We will go ahead and do a full sexually      transmitted disease screening which includes gonorrhea, chlamydia      and wet prep for swabs.  Will go ahead and also do blood test for      HIV, syphilis, hepatitis.  The patient was counseled on safe sex,      and I did let her know that there are some non-latex condoms      available.  I stressed the importance of educating patient that      without using condoms, there is no protection against not only      pregnancy but also sexually transmitted diseases as well.  3. Health maintenance:  The patient has been sexually active for      approximately 3 years.  We will go ahead and get a Pap smear today.      This was discussed with Dr. Okey Dupre, as well.  4. Contraception.  I had a lengthy discussion with the patient,      regarding different types of contraception available.  The patient      is adamant she does not want any and will instead have an abortion      if she gets pregnant.  I discussed pills, injections, IUDs and      NuvaRing with this patient and encouraged her to consider it again.      I also re-iterated that birth control pills do not necessary cause      weight gain.  The patient will continue to think about this.      Johney Maine, MD    ______________________________  Argentina Donovan, MD    JT/MEDQ  D:  07/06/2007  T:  07/07/2007  Job:  [id number]

## 2010-09-24 ENCOUNTER — Inpatient Hospital Stay (HOSPITAL_COMMUNITY)
Admission: AD | Admit: 2010-09-24 | Discharge: 2010-09-25 | Disposition: A | Discharge status: Home / Self Care | Payer: Medicaid Other | Source: Ambulatory Visit | Attending: Obstetrics | Admitting: Obstetrics

## 2010-09-24 DIAGNOSIS — O4700 False labor before 37 completed weeks of gestation, unspecified trimester: Secondary | ICD-10-CM | POA: Insufficient documentation

## 2010-10-11 ENCOUNTER — Inpatient Hospital Stay (HOSPITAL_COMMUNITY)
Admission: EM | Admit: 2010-10-11 | Discharge: 2010-10-11 | Disposition: A | Discharge status: Home / Self Care | Payer: Medicaid Other | Source: Ambulatory Visit | Attending: Obstetrics | Admitting: Obstetrics

## 2010-10-11 DIAGNOSIS — O9989 Other specified diseases and conditions complicating pregnancy, childbirth and the puerperium: Secondary | ICD-10-CM

## 2010-10-11 DIAGNOSIS — O99891 Other specified diseases and conditions complicating pregnancy: Secondary | ICD-10-CM | POA: Insufficient documentation

## 2010-10-16 ENCOUNTER — Inpatient Hospital Stay (HOSPITAL_COMMUNITY)
Admission: RE | Admit: 2010-10-16 | Discharge: 2010-10-18 | DRG: 775 | Disposition: A | Discharge status: Home / Self Care | Payer: Medicaid Other | Source: Ambulatory Visit | Attending: Obstetrics | Admitting: Obstetrics

## 2010-10-16 LAB — CBC
HCT: 34.1 % — ABNORMAL LOW (ref 36.0–46.0)
MCV: 82.6 fL (ref 78.0–100.0)
Platelets: 287 10*3/uL (ref 150–400)
RBC: 4.13 MIL/uL (ref 3.87–5.11)
RDW: 14.7 % (ref 11.5–15.5)
WBC: 11.1 10*3/uL — ABNORMAL HIGH (ref 4.0–10.5)

## 2010-10-17 LAB — CBC
Hemoglobin: 10.3 g/dL — ABNORMAL LOW (ref 12.0–15.0)
MCH: 26.9 pg (ref 26.0–34.0)
MCV: 82.5 fL (ref 78.0–100.0)
Platelets: 256 10*3/uL (ref 150–400)
RBC: 3.83 MIL/uL — ABNORMAL LOW (ref 3.87–5.11)
WBC: 16 10*3/uL — ABNORMAL HIGH (ref 4.0–10.5)

## 2010-10-20 ENCOUNTER — Inpatient Hospital Stay (HOSPITAL_COMMUNITY)
Admission: AD | Admit: 2010-10-20 | Discharge: 2010-10-21 | Disposition: A | Discharge status: Home / Self Care | Payer: Medicaid Other | Source: Ambulatory Visit | Attending: Obstetrics | Admitting: Obstetrics

## 2010-10-20 DIAGNOSIS — M542 Cervicalgia: Secondary | ICD-10-CM

## 2010-10-20 DIAGNOSIS — O9A23 Injury, poisoning and certain other consequences of external causes complicating the puerperium: Secondary | ICD-10-CM

## 2010-10-20 DIAGNOSIS — G971 Other reaction to spinal and lumbar puncture: Secondary | ICD-10-CM

## 2010-10-21 ENCOUNTER — Inpatient Hospital Stay (HOSPITAL_COMMUNITY)
Admission: AD | Admit: 2010-10-21 | Discharge: 2010-10-21 | Disposition: A | Discharge status: Home / Self Care | Payer: Medicaid Other | Source: Ambulatory Visit | Attending: Obstetrics | Admitting: Obstetrics

## 2010-10-21 DIAGNOSIS — R51 Headache: Secondary | ICD-10-CM | POA: Insufficient documentation

## 2010-10-21 DIAGNOSIS — O99893 Other specified diseases and conditions complicating puerperium: Secondary | ICD-10-CM | POA: Insufficient documentation

## 2010-10-21 LAB — CBC
HCT: 39 % (ref 36.0–46.0)
MCH: 27.9 pg (ref 26.0–34.0)
MCV: 82.5 fL (ref 78.0–100.0)
RBC: 4.73 MIL/uL (ref 3.87–5.11)
WBC: 14.4 10*3/uL — ABNORMAL HIGH (ref 4.0–10.5)

## 2010-10-21 LAB — DIFFERENTIAL
Lymphocytes Relative: 16 % (ref 12–46)
Lymphs Abs: 2.2 10*3/uL (ref 0.7–4.0)
Monocytes Relative: 6 % (ref 3–12)
Neutrophils Relative %: 76 % (ref 43–77)

## 2011-01-28 LAB — POCT URINALYSIS DIP (DEVICE)
Bilirubin Urine: NEGATIVE
Glucose, UA: NEGATIVE
Operator id: 239701
Specific Gravity, Urine: >=1.03

## 2011-01-30 LAB — POCT PREGNANCY, URINE
Operator id: 235561
Preg Test, Ur: NEGATIVE

## 2011-01-30 LAB — POCT URINALYSIS DIP (DEVICE)
Bilirubin Urine: NEGATIVE
Glucose, UA: NEGATIVE
Nitrite: NEGATIVE
Operator id: 235561
pH: 6

## 2011-02-03 LAB — URINALYSIS, ROUTINE W REFLEX MICROSCOPIC
Glucose, UA: NEGATIVE
Ketones, ur: NEGATIVE
Protein, ur: NEGATIVE

## 2011-02-03 LAB — POCT URINALYSIS DIP (DEVICE)
Bilirubin Urine: NEGATIVE
Glucose, UA: NEGATIVE
Nitrite: NEGATIVE
Operator id: 247071
pH: 6.5

## 2011-02-03 LAB — GC/CHLAMYDIA PROBE AMP, GENITAL: GC Probe Amp, Genital: NEGATIVE

## 2011-02-03 LAB — URINE CULTURE: Colony Count: 50000

## 2011-02-03 LAB — WET PREP, GENITAL
Trich, Wet Prep: NONE SEEN
Yeast Wet Prep HPF POC: NONE SEEN

## 2011-02-03 LAB — URINE MICROSCOPIC-ADD ON

## 2011-02-03 LAB — POCT PREGNANCY, URINE: Preg Test, Ur: POSITIVE

## 2011-02-04 LAB — POCT URINALYSIS DIP (DEVICE)
Ketones, ur: NEGATIVE
Protein, ur: NEGATIVE
Specific Gravity, Urine: 1.025
pH: 6

## 2011-02-04 LAB — GC/CHLAMYDIA PROBE AMP, GENITAL
Chlamydia, DNA Probe: NEGATIVE
GC Probe Amp, Genital: NEGATIVE

## 2011-02-04 LAB — WET PREP, GENITAL

## 2011-02-04 LAB — POCT PREGNANCY, URINE: Operator id: 247071

## 2011-02-06 LAB — URINE CULTURE
Colony Count: >=100000
Colony Count: 50000

## 2011-02-06 LAB — URINALYSIS, ROUTINE W REFLEX MICROSCOPIC
Bilirubin Urine: NEGATIVE
Bilirubin Urine: NEGATIVE
Glucose, UA: NEGATIVE
Glucose, UA: NEGATIVE
Ketones, ur: NEGATIVE
Ketones, ur: NEGATIVE
Nitrite: NEGATIVE
Nitrite: NEGATIVE
Protein, ur: NEGATIVE
Specific Gravity, Urine: 1.029
Specific Gravity, Urine: 1.029
Urobilinogen, UA: 0.2
pH: 5.5
pH: 5.5

## 2011-02-06 LAB — GC/CHLAMYDIA PROBE AMP, GENITAL
Chlamydia, DNA Probe: NEGATIVE
GC Probe Amp, Genital: NEGATIVE

## 2011-02-06 LAB — URINE MICROSCOPIC-ADD ON

## 2011-02-06 LAB — WET PREP, GENITAL
Trich, Wet Prep: NONE SEEN
Yeast Wet Prep HPF POC: NONE SEEN

## 2011-02-06 LAB — PREGNANCY, URINE: Preg Test, Ur: NEGATIVE

## 2011-02-09 LAB — POCT PREGNANCY, URINE: Preg Test, Ur: NEGATIVE

## 2011-02-09 LAB — POCT URINALYSIS DIP (DEVICE)
Ketones, ur: NEGATIVE
Protein, ur: NEGATIVE
pH: 6

## 2011-02-09 LAB — POCT I-STAT, CHEM 8
BUN: 8
Calcium, Ion: 1.29
Chloride: 107
Creatinine, Ser: 0.8
Glucose, Bld: 82
TCO2: 23

## 2011-02-09 LAB — WET PREP, GENITAL: Trich, Wet Prep: NONE SEEN

## 2011-02-10 LAB — URINALYSIS, ROUTINE W REFLEX MICROSCOPIC
Nitrite: NEGATIVE
Protein, ur: NEGATIVE
Specific Gravity, Urine: 1.034 — ABNORMAL HIGH
Urobilinogen, UA: 0.2

## 2011-02-10 LAB — URINE MICROSCOPIC-ADD ON

## 2011-02-11 LAB — POCT URINALYSIS DIP (DEVICE)
Bilirubin Urine: NEGATIVE
Glucose, UA: NEGATIVE
Nitrite: NEGATIVE
Operator id: 270961
Specific Gravity, Urine: 1.025
Urobilinogen, UA: 1

## 2011-02-20 LAB — POCT URINALYSIS DIP (DEVICE)
Ketones, ur: NEGATIVE
Nitrite: NEGATIVE
Operator id: 247071
Protein, ur: NEGATIVE
Urobilinogen, UA: 0.2

## 2011-02-20 LAB — WET PREP, GENITAL
Clue Cells Wet Prep HPF POC: NONE SEEN
Trich, Wet Prep: NONE SEEN
WBC, Wet Prep HPF POC: NONE SEEN
Yeast Wet Prep HPF POC: NONE SEEN

## 2011-02-20 LAB — GC/CHLAMYDIA PROBE AMP, GENITAL
Chlamydia, DNA Probe: NEGATIVE
GC Probe Amp, Genital: NEGATIVE

## 2011-02-20 LAB — POCT PREGNANCY, URINE
Operator id: 247071
Preg Test, Ur: NEGATIVE

## 2011-02-23 LAB — URINE CULTURE
Colony Count: NO GROWTH
Culture: NO GROWTH

## 2011-02-23 LAB — URINALYSIS, ROUTINE W REFLEX MICROSCOPIC
Glucose, UA: NEGATIVE
Specific Gravity, Urine: 1.034 — ABNORMAL HIGH
pH: 5.5

## 2011-02-23 LAB — POCT PREGNANCY, URINE: Preg Test, Ur: NEGATIVE

## 2011-02-23 LAB — URINE MICROSCOPIC-ADD ON

## 2011-05-12 NOTE — L&D Delivery Note (Signed)
Delivery Note At 8:48 PM a viable female was delivered via  (Presentation: ;  ).  APGAR: , ; weight .   Placenta status: Intact, Spontaneous.  Cord:  with the following complications: .  Cord pH: not done  Anesthesia:   Episiotomy:  Lacerations:  Suture Repair: 2.0 Est. Blood Loss (mL):   Mom to postpartum.  Baby to nursery-stable.  Trevell Pariseau A 02/24/2012, 9:03 PM

## 2011-07-21 ENCOUNTER — Inpatient Hospital Stay (HOSPITAL_COMMUNITY): Payer: Medicaid Other

## 2011-07-21 ENCOUNTER — Encounter (HOSPITAL_COMMUNITY): Payer: Self-pay | Admitting: *Deleted

## 2011-07-21 ENCOUNTER — Inpatient Hospital Stay (HOSPITAL_COMMUNITY)
Admission: AD | Admit: 2011-07-21 | Discharge: 2011-07-21 | Disposition: A | Discharge status: Home / Self Care | Payer: Medicaid Other | Source: Ambulatory Visit | Attending: Obstetrics & Gynecology | Admitting: Obstetrics & Gynecology

## 2011-07-21 DIAGNOSIS — R109 Unspecified abdominal pain: Secondary | ICD-10-CM | POA: Insufficient documentation

## 2011-07-21 DIAGNOSIS — O209 Hemorrhage in early pregnancy, unspecified: Secondary | ICD-10-CM

## 2011-07-21 DIAGNOSIS — B9689 Other specified bacterial agents as the cause of diseases classified elsewhere: Secondary | ICD-10-CM | POA: Insufficient documentation

## 2011-07-21 DIAGNOSIS — O2390 Unspecified genitourinary tract infection in pregnancy, unspecified trimester: Secondary | ICD-10-CM | POA: Insufficient documentation

## 2011-07-21 DIAGNOSIS — N760 Acute vaginitis: Secondary | ICD-10-CM | POA: Insufficient documentation

## 2011-07-21 DIAGNOSIS — A499 Bacterial infection, unspecified: Secondary | ICD-10-CM | POA: Insufficient documentation

## 2011-07-21 DIAGNOSIS — N72 Inflammatory disease of cervix uteri: Secondary | ICD-10-CM | POA: Insufficient documentation

## 2011-07-21 LAB — DIFFERENTIAL
Eosinophils Absolute: 0.1 10*3/uL (ref 0.0–0.7)
Eosinophils Relative: 1 % (ref 0–5)
Lymphs Abs: 2.7 10*3/uL (ref 0.7–4.0)
Monocytes Absolute: 0.8 10*3/uL (ref 0.1–1.0)

## 2011-07-21 LAB — CBC
HCT: 39.5 % (ref 36.0–46.0)
MCH: 27.9 pg (ref 26.0–34.0)
MCV: 84.2 fL (ref 78.0–100.0)
Platelets: 342 10*3/uL (ref 150–400)
RBC: 4.69 MIL/uL (ref 3.87–5.11)
RDW: 15.2 % (ref 11.5–15.5)

## 2011-07-21 LAB — WET PREP, GENITAL
Trich, Wet Prep: NONE SEEN
Yeast Wet Prep HPF POC: NONE SEEN

## 2011-07-21 LAB — POCT PREGNANCY, URINE: Preg Test, Ur: POSITIVE — AB

## 2011-07-21 LAB — HCG, QUANTITATIVE, PREGNANCY: hCG, Beta Chain, Quant, S: 21949 m[IU]/mL — ABNORMAL HIGH (ref <5)

## 2011-07-21 LAB — ABO/RH: ABO/RH(D): A POS

## 2011-07-21 MED ORDER — AZITHROMYCIN 250 MG PO TABS
1000.0000 mg | ORAL_TABLET | Freq: Once | ORAL | Status: AC
  Administered 2011-07-21: 1000 mg via ORAL
  Filled 2011-07-21: qty 4

## 2011-07-21 MED ORDER — METRONIDAZOLE 500 MG PO TABS: 500.0000 mg | ORAL_TABLET | Freq: Two times a day (BID) | ORAL | Status: AC

## 2011-07-21 NOTE — MAU Provider Note (Signed)
Attestation of Attending Supervision of Advanced Practitioner: Evaluation and management procedures were performed by the PA/NP/CNM/OB Fellow under my supervision/collaboration. Chart reviewed, and agree with management and plan.  Jaynie Collins, M.D. 07/21/2011 5:11 PM

## 2011-07-21 NOTE — MAU Note (Signed)
Patient states she has missed her last two periods. Has nausea, no vomiting. Had spotting with intercourse one week ago.

## 2011-07-21 NOTE — MAU Provider Note (Signed)
History     CSN: [id number]  Arrival date & time 07/21/11  1438   None     Chief Complaint  Patient presents with  . Possible Pregnancy    HPI Anita Allen is a 21 y.o. female who presents to MAU for lower abdominal cramping, spotting after intercourse and missed period. Some nausea. Denies any other problems. The history was provided by the patient.  Past Medical History  Diagnosis Date  . No pertinent past medical history     Past Surgical History  Procedure Date  . No past surgeries     Family History  Problem Relation Age of Onset  . Heart disease Father     History  Substance Use Topics  . Smoking status: Never Smoker   . Smokeless tobacco: Not on file  . Alcohol Use: No    OB History    Grav Para Term Preterm Abortions TAB SAB Ect Mult Living   2 1 0 0 0 0 0 0 0 1       Review of Systems  Constitutional: Negative for fever, chills, diaphoresis and fatigue.  HENT: Negative for ear pain, congestion, sore throat, facial swelling, neck pain, neck stiffness, dental problem and sinus pressure.   Eyes: Negative for photophobia, pain and discharge.  Respiratory: Negative for cough, chest tightness and wheezing.   Gastrointestinal: Positive for nausea. Negative for vomiting, abdominal pain (cramping), diarrhea, constipation and abdominal distention.  Genitourinary: Positive for frequency, vaginal bleeding and vaginal discharge. Negative for dysuria, urgency, flank pain, difficulty urinating and pelvic pain.  Musculoskeletal: Negative for myalgias, back pain and gait problem.  Skin: Negative for color change and rash.  Neurological: Negative for dizziness, speech difficulty, weakness, light-headedness, numbness and headaches.  Psychiatric/Behavioral: Negative for confusion and agitation.    Allergies  Augmentin  Home Medications  No current outpatient prescriptions on file.  BP 131/75  Pulse 91  Temp(Src) 98 F (36.7 C) (Oral)  Resp 16  Ht 5\' 2"  (1.575  m)  Wt 161 lb 6.4 oz (73.211 kg)  BMI 29.52 kg/m2  SpO2 99%  LMP 06/05/2011  Physical Exam  Nursing note and vitals reviewed. Constitutional: She is oriented to person, place, and time. She appears well-developed and well-nourished. No distress.  HENT:  Head: Normocephalic.  Eyes: EOM are normal.  Neck: Neck supple.  Cardiovascular: Normal rate.   Pulmonary/Chest: Effort normal.  Abdominal: Soft. There is no tenderness.  Genitourinary:       External genitalia without lesions. Small blood vaginal vault. Cervix closed, inflamed, no CMT, no adnexal tenderness. Uterus without palpable enlargement.  Musculoskeletal: Normal range of motion.  Neurological: She is alert and oriented to person, place, and time. No cranial nerve deficit.  Skin: Skin is warm and dry.  Psychiatric: She has a normal mood and affect. Her behavior is normal. Judgment and thought content normal.   Results for orders placed during the hospital encounter of 07/21/11 (from the past 24 hour(s))  POCT PREGNANCY, URINE     Status: Abnormal   Collection Time   07/21/11  3:15 PM      Component Value Range   Preg Test, Ur POSITIVE (*) NEGATIVE   CBC     Status: Abnormal   Collection Time   07/21/11  4:00 PM      Component Value Range   WBC 10.8 (*) 4.0 - 10.5 (K/uL)   RBC 4.69  3.87 - 5.11 (MIL/uL)   Hemoglobin 13.1  12.0 - 15.0 (g/dL)  HCT 39.5  36.0 - 46.0 (%)   MCV 84.2  78.0 - 100.0 (fL)   MCH 27.9  26.0 - 34.0 (pg)   MCHC 33.2  30.0 - 36.0 (g/dL)   RDW 40.9  81.1 - 91.4 (%)   Platelets 342  150 - 400 (K/uL)  DIFFERENTIAL     Status: Normal   Collection Time   07/21/11  4:00 PM      Component Value Range   Neutrophils Relative 66  43 - 77 (%)   Neutro Abs 7.2  1.7 - 7.7 (K/uL)   Lymphocytes Relative 25  12 - 46 (%)   Lymphs Abs 2.7  0.7 - 4.0 (K/uL)   Monocytes Relative 8  3 - 12 (%)   Monocytes Absolute 0.8  0.1 - 1.0 (K/uL)   Eosinophils Relative 1  0 - 5 (%)   Eosinophils Absolute 0.1  0.0 - 0.7  (K/uL)   Basophils Relative 0  0 - 1 (%)   Basophils Absolute 0.0  0.0 - 0.1 (K/uL)  ABO/RH     Status: Normal   Collection Time   07/21/11  4:00 PM      Component Value Range   ABO/RH(D) A POS    WET PREP, GENITAL     Status: Abnormal   Collection Time   07/21/11  4:15 PM      Component Value Range   Yeast Wet Prep HPF POC NONE SEEN  NONE SEEN    Trich, Wet Prep NONE SEEN  NONE SEEN    Clue Cells Wet Prep HPF POC MODERATE (*) NONE SEEN    WBC, Wet Prep HPF POC MODERATE (*) NONE SEEN    Ultrasound today shows a 6 week 1 day IUP with cardiac activity. Due date of 03/11/12. Assessment: Postcoital bleeding   Cervicitis   Bacterial vaginosis  Plan:  Flagyl Rx   Zithromax 1 gram po now   Start prenatal care   Return as needed.   Pregnancy verification letter given ED Course  Procedures   MDM

## 2011-07-21 NOTE — Discharge Instructions (Signed)
________________________________________     To schedule your Maternity Eligibility Appointment, please call [phone].  When you arrive for your appointment you must bring the following items or information listed below.  Your appointment will be rescheduled if you do not have these items or are 15 minutes late. If currently receiving Medicaid, you MUST bring: 1. Medicaid Card 2. Social Security Card 3. Picture ID 4. Proof of Pregnancy 5. Verification of current address if the address on Medicaid card is incorrect "postmarked mail" If not receiving Medicaid, you MUST bring: 1. Social Security Card 2. Picture ID 3. Birth Certificate (if available) Passport or *Green Card 4. Proof of Pregnancy 5. Verification of current address "postmarked mail" for each income presented. 6. Verification of insurance coverage, if any 7. Check stubs from each employer for the previous month (if unable to present check stub  for each week, we will accept check stub for the first and last week ill the same month.) If you can't locate check stubs, you must bring a letter from the employer(s) and it must have the following information on letterhead, typed, in English: o name of company o company telephone number o how long been with the company, if less than one month o how much person earns per hour o how many hours per week work o the gross pay the person earned for the previous month If you are 21 years old or less, you do not have to bring proof of income unless you work or live with the father of the baby and at that time we will need proof of income from you and/or the father of the baby. Green Card recipients are eligible for Medicaid for Pregnant Women (MPW)    Cervicitis Cervicitis is a soreness and swelling (inflammation) of the cervix. Your cervix is located at the bottom of your uterus which opens up to the vagina.  CAUSES   Sexually transmitted infections (STIs).   Allergic  reaction.   Medicines or birth control devices that are put in the vagina.   Injury to the cervix.   Bacterial infections.  SYMPTOMS  There may be no symptoms. If symptoms occur, they may include:  Grey, white, yellow, or bad smelling vaginal discharge.   Pain or itching of the area outside the vagina.   Painful sexual intercourse.   Lower abdominal or lower back pain, especially during intercourse.   Frequent urination.   Abnormal vaginal bleeding between periods, after sexual intercourse, or after menopause.   Pressure or a heavy feeling in the pelvis.  DIAGNOSIS  Diagnosis is made after a pelvic exam. Other tests may include:  Examination of any discharge under a microscope (wet prep).   A Pap test.  TREATMENT  Treatment will depend on the cause of cervicitis. If it is caused by an STI, both you and your partner will need to be treated. Antibiotic medicines will be given. HOME CARE INSTRUCTIONS   Do not have sexual intercourse until your caregiver says it is okay.   Do not have sexual intercourse until your partner has been treated if your cervicitis is caused by an STI.   Take your antibiotics as directed. Finish them even if you start to feel better.  SEEK IMMEDIATE MEDICAL CARE IF:   Your symptoms come back.   You have a fever.   You experience any problems that may be related to the medicine you are taking.  MAKE SURE YOU:   Understand these instructions.   Will  watch your condition.   Will get help right away if you are not doing well or get worse.  Document Released: 04/27/2005 Document Revised: 04/16/2011 Document Reviewed: 11/24/2010 [facility] Patient Information 2012 [place], [place].Threatened Miscarriage  A threatened miscarriage is a pregnancy that may end. It may be marked by bleeding during the first 20 weeks of pregnancy. Often, the pregnancy can continue without any more problems. You may be asked to stop:  Having sex (intercourse).   Having  orgasms.   Using tampons.   Exercising.   Doing heavy physical activity and work.  HOME CARE   Your doctor may tell you to take bed rest and to stop activities and work.   Write down the number of pads you use each day. Write down how often you change pads. Write down how soaked they are.   Follow your doctor's advice for follow-up visits and tests.   If your blood type is Rh-negative and the father's blood is Rh-positive (or is not known), you may get a shot to protect the baby.   If you have a miscarriage, save all the tissue you pass in a container. Take the container to your doctor.  GET HELP RIGHT AWAY IF:   You have bad cramps or pain in your belly (abdomen), lower belly, or back.   You have a fever or chills.   Your bleeding gets worse or you pass large clots of blood or tissue. Save this tissue to show your doctor.   You feel lightheaded, weak, dizzy, or pass out (faint).   You have a gush of fluid from your vagina.  MAKE SURE YOU:   Understand these instructions.   Will watch your condition.   Will get help right away if you are not doing well or get worse.  Document Released: 04/09/2008 Document Revised: 04/16/2011 Document Reviewed: 05/13/2009 [facility] Patient Information 2012 [place], [place].Threatened Miscarriage  A threatened miscarriage is a pregnancy that may end. It may be marked by bleeding during the first 20 weeks of pregnancy. Often, the pregnancy can continue without any more problems. You may be asked to stop:  Having sex (intercourse).   Having orgasms.   Using tampons.   Exercising.   Doing heavy physical activity and work.  HOME CARE   Your doctor may tell you to take bed rest and to stop activities and work.   Write down the number of pads you use each day. Write down how often you change pads. Write down how soaked they are.   Follow your doctor's advice for follow-up visits and tests.   If your blood type is Rh-negative and the  father's blood is Rh-positive (or is not known), you may get a shot to protect the baby.   If you have a miscarriage, save all the tissue you pass in a container. Take the container to your doctor.  GET HELP RIGHT AWAY IF:   You have bad cramps or pain in your belly (abdomen), lower belly, or back.   You have a fever or chills.   Your bleeding gets worse or you pass large clots of blood or tissue. Save this tissue to show your doctor.   You feel lightheaded, weak, dizzy, or pass out (faint).   You have a gush of fluid from your vagina.  MAKE SURE YOU:   Understand these instructions.   Will watch your condition.   Will get help right away if you are not doing well or get worse.  Document Released:  04/09/2008 Document Revised: 04/16/2011 Document Reviewed: 05/13/2009 [facility] Patient Information 2012 [place], [place].

## 2011-07-21 NOTE — MAU Note (Signed)
Pt states she is coming in for a pregnancy

## 2011-07-22 LAB — GC/CHLAMYDIA PROBE AMP, GENITAL: GC Probe Amp, Genital: POSITIVE — AB

## 2011-07-23 ENCOUNTER — Telehealth (HOSPITAL_COMMUNITY): Payer: Self-pay | Admitting: Nurse Practitioner

## 2011-08-05 NOTE — Telephone Encounter (Signed)
Patient to HD for tx.

## 2011-08-31 ENCOUNTER — Inpatient Hospital Stay (HOSPITAL_COMMUNITY)
Admission: AD | Admit: 2011-08-31 | Discharge: 2011-09-01 | Disposition: A | Discharge status: Hospice | Payer: Medicaid Other | Source: Ambulatory Visit | Attending: Obstetrics and Gynecology | Admitting: Obstetrics and Gynecology

## 2011-08-31 ENCOUNTER — Encounter (HOSPITAL_COMMUNITY): Payer: Self-pay | Admitting: *Deleted

## 2011-08-31 DIAGNOSIS — O98919 Unspecified maternal infectious and parasitic disease complicating pregnancy, unspecified trimester: Secondary | ICD-10-CM

## 2011-08-31 DIAGNOSIS — IMO0002 Reserved for concepts with insufficient information to code with codable children: Secondary | ICD-10-CM

## 2011-08-31 DIAGNOSIS — O98219 Gonorrhea complicating pregnancy, unspecified trimester: Secondary | ICD-10-CM | POA: Insufficient documentation

## 2011-08-31 DIAGNOSIS — A549 Gonococcal infection, unspecified: Secondary | ICD-10-CM

## 2011-08-31 DIAGNOSIS — B999 Unspecified infectious disease: Secondary | ICD-10-CM

## 2011-08-31 DIAGNOSIS — O209 Hemorrhage in early pregnancy, unspecified: Secondary | ICD-10-CM | POA: Insufficient documentation

## 2011-08-31 DIAGNOSIS — O2390 Unspecified genitourinary tract infection in pregnancy, unspecified trimester: Secondary | ICD-10-CM | POA: Insufficient documentation

## 2011-08-31 DIAGNOSIS — A499 Bacterial infection, unspecified: Secondary | ICD-10-CM

## 2011-08-31 DIAGNOSIS — N760 Acute vaginitis: Secondary | ICD-10-CM | POA: Insufficient documentation

## 2011-08-31 DIAGNOSIS — B9689 Other specified bacterial agents as the cause of diseases classified elsewhere: Secondary | ICD-10-CM | POA: Insufficient documentation

## 2011-08-31 DIAGNOSIS — A5400 Gonococcal infection of lower genitourinary tract, unspecified: Secondary | ICD-10-CM | POA: Insufficient documentation

## 2011-08-31 HISTORY — DX: Contact with and (suspected) exposure to infections with a predominantly sexual mode of transmission: Z20.2

## 2011-08-31 NOTE — MAU Note (Signed)
PT SAYS  ON Saturday - STARTED VAG BLEEDING-  THEN SLOWED DOWN BY Sunday AM.   NO BLEEDING NOW.  SAYS HAS BAD  BACK AND ABD PAINS.Anita Allen  TOOK  MOTRIN   EVERYDAY AND TODAY AT 6 AM.   LAST SEX WAS - LAST  Wednesday..  DR MARSHALL DELIVERED LAST BABY-   10-16-2010

## 2011-09-01 ENCOUNTER — Encounter (HOSPITAL_COMMUNITY): Payer: Self-pay | Admitting: *Deleted

## 2011-09-01 LAB — URINALYSIS, ROUTINE W REFLEX MICROSCOPIC
Ketones, ur: NEGATIVE mg/dL
Nitrite: NEGATIVE
Protein, ur: NEGATIVE mg/dL
pH: 6 (ref 5.0–8.0)

## 2011-09-01 LAB — URINE MICROSCOPIC-ADD ON

## 2011-09-01 MED ORDER — DIPHENHYDRAMINE HCL 25 MG PO CAPS
25.0000 mg | ORAL_CAPSULE | Freq: Once | ORAL | Status: AC
  Administered 2011-09-01: 25 mg via ORAL
  Filled 2011-09-01: qty 1

## 2011-09-01 MED ORDER — AZITHROMYCIN 250 MG PO TABS
1000.0000 mg | ORAL_TABLET | Freq: Once | ORAL | Status: AC
  Administered 2011-09-01: 1000 mg via ORAL
  Filled 2011-09-01: qty 4

## 2011-09-01 MED ORDER — CEFTRIAXONE SODIUM 250 MG IJ SOLR
250.0000 mg | Freq: Once | INTRAMUSCULAR | Status: AC
  Administered 2011-09-01: 250 mg via INTRAMUSCULAR
  Filled 2011-09-01: qty 250

## 2011-09-01 MED ORDER — ONDANSETRON 8 MG PO TBDP
8.0000 mg | ORAL_TABLET | Freq: Once | ORAL | Status: AC
  Administered 2011-09-01: 8 mg via ORAL
  Filled 2011-09-01: qty 1

## 2011-09-01 MED ORDER — METRONIDAZOLE 500 MG PO TABS: 500.0000 mg | ORAL_TABLET | Freq: Two times a day (BID) | ORAL | Status: AC

## 2011-09-01 NOTE — Discharge Instructions (Signed)
We have treated you tonight for gonorrhea and chlamydia. You will need to get your Rx filled to treat the bacterial vaginosis. Be sure your partner is retested.    Bacterial Vaginosis Bacterial vaginosis (BV) is a vaginal infection where the normal balance of bacteria in the vagina is disrupted. The normal balance is then replaced by an overgrowth of certain bacteria. There are several different kinds of bacteria that can cause BV. BV is the most common vaginal infection in women of childbearing age. CAUSES   The cause of BV is not fully understood. BV develops when there is an increase or imbalance of harmful bacteria.   Some activities or behaviors can upset the normal balance of bacteria in the vagina and put women at increased risk including:   Having a new sex partner or multiple sex partners.   Douching.   Using an intrauterine device (IUD) for contraception.   It is not clear what role sexual activity plays in the development of BV. However, women that have never had sexual intercourse are rarely infected with BV.  Women do not get BV from toilet seats, bedding, swimming pools or from touching objects around them.  SYMPTOMS   Grey vaginal discharge.   A fish-like odor with discharge, especially after sexual intercourse.   Itching or burning of the vagina and vulva.   Burning or pain with urination.   Some women have no signs or symptoms at all.  DIAGNOSIS  Your caregiver must examine the vagina for signs of BV. Your caregiver will perform lab tests and look at the sample of vaginal fluid through a microscope. They will look for bacteria and abnormal cells (clue cells), a pH test higher than 4.5, and a positive amine test all associated with BV.  RISKS AND COMPLICATIONS   Pelvic inflammatory disease (PID).   Infections following gynecology surgery.   Developing HIV.   Developing herpes virus.  TREATMENT  Sometimes BV will clear up without treatment. However, all women  with symptoms of BV should be treated to avoid complications, especially if gynecology surgery is planned. Female partners generally do not need to be treated. However, BV may spread between female sex partners so treatment is helpful in preventing a recurrence of BV.   BV may be treated with antibiotics. The antibiotics come in either pill or vaginal cream forms. Either can be used with nonpregnant or pregnant women, but the recommended dosages differ. These antibiotics are not harmful to the baby.   BV can recur after treatment. If this happens, a second round of antibiotics will often be prescribed.   Treatment is important for pregnant women. If not treated, BV can cause a premature delivery, especially for a pregnant woman who had a premature birth in the past. All pregnant women who have symptoms of BV should be checked and treated.   For chronic reoccurrence of BV, treatment with a type of prescribed gel vaginally twice a week is helpful.  HOME CARE INSTRUCTIONS   Finish all medication as directed by your caregiver.   Do not have sex until treatment is completed.   Tell your sexual partner that you have a vaginal infection. They should see their caregiver and be treated if they have problems, such as a mild rash or itching.   Practice safe sex. Use condoms. Only have 1 sex partner.  PREVENTION  Basic prevention steps can help reduce the risk of upsetting the natural balance of bacteria in the vagina and developing BV:  Do  not have sexual intercourse (be abstinent).   Do not douche.   Use all of the medicine prescribed for treatment of BV, even if the signs and symptoms go away.   Tell your sex partner if you have BV. That way, they can be treated, if needed, to prevent reoccurrence.  SEEK MEDICAL CARE IF:   Your symptoms are not improving after 3 days of treatment.   You have increased discharge, pain, or fever.  MAKE SURE YOU:   Understand these instructions.   Will watch  your condition.   Will get help right away if you are not doing well or get worse.  FOR MORE INFORMATION  Division of STD Prevention (DSTDP), Centers for Disease Control and Prevention: [employer] American Social Health Association (ASHA): www.ashastd.org  Document Released: 04/27/2005 Document Revised: 04/16/2011 Document Reviewed: 10/18/2008 [facility] Patient Information 2012 [place], [place].

## 2011-09-01 NOTE — Progress Notes (Signed)
Pt discussing her fears and challenges with this new pregnancy.  Educated pt on abortion procedure and answered her questions.

## 2011-09-01 NOTE — MAU Provider Note (Signed)
Agree with above note.  Anita Allen 09/01/2011 6:50 AM

## 2011-09-01 NOTE — MAU Provider Note (Signed)
History     CSN: [id number]  Arrival date & time 08/31/11  2338   None     No chief complaint on file.   HPI Alanda J [place] is a 21 y.o. female @ [redacted]w[redacted]d gestation who presents to MAU for vaginal bleeding. The bleeding started 2 days as was like a period. The bleeding is much less now and just occasional cramping. Last sexual intercourse 5 days ago. The patient is concerned because she was diagnoses with GC on her last visit to MAU. [facility] and after she took the medication she vomited. She has continued to have vaginal discharge with a bad odor. She request retesting and that she be treated tonight to cover GC and Chlamydia. The history was provided by the patient.   Past Medical History  Diagnosis Date  . No pertinent past medical history   . Gonorrhea contact, treated     Past Surgical History  Procedure Date  . No past surgeries     Family History  Problem Relation Age of Onset  . Heart disease Father     History  Substance Use Topics  . Smoking status: Never Smoker   . Smokeless tobacco: Not on file  . Alcohol Use: No    OB History    Grav Para Term Preterm Abortions TAB SAB Ect Mult Living   3 1 0 0 1 1 0 0 0 1       Review of Systems  Constitutional: Negative for fever and chills.  HENT: Negative.   Eyes: Negative.   Respiratory: Negative.   Cardiovascular: Negative.   Gastrointestinal: Positive for abdominal pain (CRAMPING). Negative for nausea and vomiting.  Genitourinary: Positive for frequency, vaginal bleeding and vaginal discharge. Negative for dysuria and urgency.  Musculoskeletal: Negative for back pain.  Neurological: Negative for syncope and headaches.  Psychiatric/Behavioral: Negative for agitation. The patient is not nervous/anxious.     Allergies  Augmentin  Home Medications  No current outpatient prescriptions on file.  BP 107/52  Pulse 86  Temp(Src) 97.8 F (36.6 C) (Oral)  Resp 20  Ht 5\' 1"   (1.549 m)  Wt 159 lb (72.122 kg)  BMI 30.04 kg/m2  LMP 06/05/2011  Physical Exam  Nursing note and vitals reviewed. Constitutional: She is oriented to person, place, and time. She appears well-developed and well-nourished.  HENT:  Head: Normocephalic.  Eyes: EOM are normal.  Neck: Neck supple.  Cardiovascular: Normal rate.   Pulmonary/Chest: Effort normal.  Abdominal: Soft. There is no tenderness.       Positive FHT's.  Genitourinary:       External genitalia without lesions. Yellow discharge vaginal vault, malodorous, no bleeding noted. Cervix inflamed, positive CMT, mildly tender bilateral adnexa. Uterus approximately 12 week size.  Musculoskeletal: Normal range of motion.  Neurological: She is alert and oriented to person, place, and time. No cranial nerve deficit.  Skin: Skin is warm and dry.  Psychiatric: She has a normal mood and affect. Her behavior is normal. Judgment and thought content normal.   Results for orders placed during the hospital encounter of 08/31/11 (from the past 24 hour(s))  URINALYSIS, ROUTINE W REFLEX MICROSCOPIC     Status: Abnormal   Collection Time   08/31/11 11:53 PM      Component Value Range   Color, Urine YELLOW  YELLOW    APPearance CLEAR  CLEAR    Specific Gravity, Urine 1.025  1.005 - 1.030    pH 6.0  5.0 - 8.0    Glucose, UA NEGATIVE  NEGATIVE (mg/dL)   Hgb urine dipstick SMALL (*) NEGATIVE    Bilirubin Urine NEGATIVE  NEGATIVE    Ketones, ur NEGATIVE  NEGATIVE (mg/dL)   Protein, ur NEGATIVE  NEGATIVE (mg/dL)   Urobilinogen, UA 0.2  0.0 - 1.0 (mg/dL)   Nitrite NEGATIVE  NEGATIVE    Leukocytes, UA TRACE (*) NEGATIVE   URINE MICROSCOPIC-ADD ON     Status: Abnormal   Collection Time   08/31/11 11:53 PM      Component Value Range   Squamous Epithelial / LPF FEW (*) RARE    WBC, UA 3-6  <3 (WBC/hpf)   RBC / HPF 3-6  <3 (RBC/hpf)   Bacteria, UA FEW (*) RARE   WET PREP, GENITAL     Status: Abnormal   Collection Time   09/01/11 12:40 AM       Component Value Range   Yeast Wet Prep HPF POC NONE SEEN  NONE SEEN    Trich, Wet Prep NONE SEEN  NONE SEEN    Clue Cells Wet Prep HPF POC FEW (*) NONE SEEN    WBC, Wet Prep HPF POC MANY (*) NONE SEEN    Assessment: Bacterial vaginosis   [facility] and patient vomited medication  Plan:  Rocephin 250 mg IM (patient has hives with Augmentin so will give Benadryl prior to injection)   Zithromax 1 gram po ( will give Zofran ODT and food prior to giving Zithromax)   Discussed follow up with patient and need for partner retesting   Patient voices understanding. ED Course  Procedures   MDM

## 2011-09-17 ENCOUNTER — Encounter (HOSPITAL_COMMUNITY): Payer: Self-pay | Admitting: *Deleted

## 2011-09-17 ENCOUNTER — Inpatient Hospital Stay (HOSPITAL_COMMUNITY)
Admission: AD | Admit: 2011-09-17 | Discharge: 2011-09-18 | Disposition: A | Discharge status: Home / Self Care | Payer: Medicaid Other | Source: Ambulatory Visit | Attending: Obstetrics & Gynecology | Admitting: Obstetrics & Gynecology

## 2011-09-17 DIAGNOSIS — R109 Unspecified abdominal pain: Secondary | ICD-10-CM | POA: Insufficient documentation

## 2011-09-17 DIAGNOSIS — R141 Gas pain: Secondary | ICD-10-CM

## 2011-09-17 DIAGNOSIS — R143 Flatulence: Secondary | ICD-10-CM

## 2011-09-17 DIAGNOSIS — O99891 Other specified diseases and conditions complicating pregnancy: Secondary | ICD-10-CM | POA: Insufficient documentation

## 2011-09-17 LAB — URINE MICROSCOPIC-ADD ON

## 2011-09-17 LAB — URINALYSIS, ROUTINE W REFLEX MICROSCOPIC
Bilirubin Urine: NEGATIVE
Ketones, ur: NEGATIVE mg/dL
Nitrite: NEGATIVE
Protein, ur: NEGATIVE mg/dL

## 2011-09-17 NOTE — MAU Note (Signed)
Pt reports she is having "really sharp pains" , pt states she thinks it may be gas pains but her back is also hurting. Denies vaginal bleeding. Has had pain for 4 hours.

## 2011-09-18 MED ORDER — SIMETHICONE 80 MG PO CHEW
160.0000 mg | CHEWABLE_TABLET | ORAL | Status: AC
  Administered 2011-09-18: 160 mg via ORAL
  Filled 2011-09-18: qty 2

## 2011-09-18 NOTE — Discharge Instructions (Signed)
Bloating  Bloating is the feeling of fullness in your belly. You may feel as though your pants are too tight. Often the cause of bloating is overeating, retaining fluids, or having gas in your bowel. It is also caused by swallowing air and eating foods that cause gas. Irritable bowel syndrome is one of the most common causes of bloating. Constipation is also a common cause. Sometimes more serious problems can cause bloating.  SYMPTOMS    Usually there is a feeling of fullness, as though your abdomen is bulged out. There may be mild discomfort.    DIAGNOSIS    Usually no particular testing is necessary for most bloating. If the condition persists and seems to become worse, your caregiver may do additional testing.    TREATMENT     There is no direct treatment for bloating.    Do not put gas into the bowel. Avoid chewing gum and sucking on candy. These tend to make you swallow air. Swallowing air can also be a nervous habit. Try to avoid this.    Avoiding high residue diets will help. Eat foods with soluble fibers (examples include root vegetables, apples, or barley) and substitute dairy products with soy and rice products. This helps irritable bowel syndrome.    If constipation is the cause, then a high residue diet with more fiber will help.    Avoid carbonated beverages.    Over-the-counter preparations are available that help reduce gas. Your pharmacist can help you with this.   SEEK MEDICAL CARE IF:     Bloating continues and seems to be getting worse.    You notice a weight gain.    You have a weight loss but the bloating is getting worse.    You have changes in your bowel habits or develop nausea or vomiting.   SEEK IMMEDIATE MEDICAL CARE IF:     You develop shortness of breath or swelling in your legs.    You have an increase in abdominal pain or develop chest pain.   Document Released: 02/25/2006 Document Revised: 04/16/2011 Document Reviewed: 04/15/2007   [employer] Patient Information 2012 [employer].

## 2011-09-18 NOTE — MAU Provider Note (Signed)
Anita Allen 89 y.o.[id number] @[redacted]w[redacted]d  by LMP Chief Complaint  Patient presents with  . Abdominal Pain     First Provider Initiated Contact with Patient 09/18/11 0003      SUBJECTIVE  HPI: Pt presents to MAU with report of gas pain. She describes this as pain in her lower back and upper abdomen above her belly button and it started after a bowel movement this morning.  She feels better when bending forward and feels like she needs to pass gas but cannot.  Her last BM before this morning was 2-3 days ago.  She denies cramping/contractions, vaginal bleeding, vaginal itching/burning, unusual discharge, urinary symptoms, h/a, n/v, dizziness, or fever/chills.  She applied for Medicaid recently and does not have insurance to cover medications, including prenatal vitamins.    Past Medical History  Diagnosis Date  . No pertinent past medical history   . Gonorrhea contact, treated    Past Surgical History  Procedure Date  . No past surgeries    History   Social History  . Marital Status: Single    Spouse Name: N/A    Number of Children: N/A  . Years of Education: N/A   Occupational History  . Not on file.   Social History Main Topics  . Smoking status: Never Smoker   . Smokeless tobacco: Not on file  . Alcohol Use: No  . Drug Use: No  . Sexually Active: Yes   Other Topics Concern  . Not on file   Social History Narrative  . No narrative on file   No current facility-administered medications on file prior to encounter.   No current outpatient prescriptions on file prior to encounter.   Allergies  Allergen Reactions  . Amoxicillin-Pot Clavulanate Hives    ROS: Pertinent items in HPI  OBJECTIVE Blood pressure 128/82, pulse 96, temperature 98.1 F (36.7 C), temperature source Oral, resp. rate 18, height 5\' 1"  (1.549 m), weight 72.122 kg (159 lb), last menstrual period 06/05/2011, SpO2 100.00%.  GENERAL: Well-developed, well-nourished female in no acute distress.    HEENT: Normocephalic, good dentition HEART: normal rate RESP: normal effort ABDOMEN: Soft, nontender EXTREMITIES: Nontender, no edema NEURO: Alert and oriented Speculum exam deferred  CVA tenderness negative No abdominal tenderness, including rebound tenderness or McBurney's point  FHT 145 via doppler  LAB RESULTS Results for orders placed during the hospital encounter of 09/17/11 (from the past 24 hour(s))  URINALYSIS, ROUTINE W REFLEX MICROSCOPIC     Status: Abnormal   Collection Time   09/17/11 10:45 PM      Component Value Range   Color, Urine YELLOW  YELLOW    APPearance CLEAR  CLEAR    Specific Gravity, Urine >1.030 (*) 1.005 - 1.030    pH 6.0  5.0 - 8.0    Glucose, UA NEGATIVE  NEGATIVE (mg/dL)   Hgb urine dipstick MODERATE (*) NEGATIVE    Bilirubin Urine NEGATIVE  NEGATIVE    Ketones, ur NEGATIVE  NEGATIVE (mg/dL)   Protein, ur NEGATIVE  NEGATIVE (mg/dL)   Urobilinogen, UA 0.2  0.0 - 1.0 (mg/dL)   Nitrite NEGATIVE  NEGATIVE    Leukocytes, UA SMALL (*) NEGATIVE   URINE MICROSCOPIC-ADD ON     Status: Abnormal   Collection Time   09/17/11 10:45 PM      Component Value Range   Squamous Epithelial / LPF MANY (*) RARE    WBC, UA 3-6  <3 (WBC/hpf)   RBC / HPF 3-6  <3 (RBC/hpf)   Bacteria,  UA RARE  RARE    Urine-Other MUCOUS PRESENT       IMAGING Not indicated  ASSESSMENT IUP via 6 week sono Abdominal gas pain  PLAN Simethicone 160 mg PO given in MAU with significant relief Urine sent for culture Discussed increased fiber and fluid intake and OTC simethicone Discussed low cost vitamin options until Medicaid is approved D/C home F/U with prenatal care as soon as possible Return to MAU as needed     LEFTWICH-KIRBY, Sergi Gellner 09/18/2011 12:19 AM

## 2011-09-19 LAB — URINE CULTURE: Culture  Setup Time: 201305100821

## 2011-09-21 NOTE — MAU Provider Note (Signed)
Medical Screening exam and patient care preformed by advanced practice provider.  Agree with the above management.  

## 2011-10-08 ENCOUNTER — Inpatient Hospital Stay (HOSPITAL_COMMUNITY)
Admission: AD | Admit: 2011-10-08 | Discharge: 2011-10-08 | Disposition: A | Discharge status: Home / Self Care | Payer: Medicaid Other | Source: Ambulatory Visit | Attending: Obstetrics | Admitting: Obstetrics

## 2011-10-08 ENCOUNTER — Inpatient Hospital Stay (HOSPITAL_COMMUNITY): Payer: Medicaid Other

## 2011-10-08 ENCOUNTER — Encounter (HOSPITAL_COMMUNITY): Payer: Self-pay

## 2011-10-08 DIAGNOSIS — O2390 Unspecified genitourinary tract infection in pregnancy, unspecified trimester: Secondary | ICD-10-CM | POA: Insufficient documentation

## 2011-10-08 DIAGNOSIS — N949 Unspecified condition associated with female genital organs and menstrual cycle: Secondary | ICD-10-CM

## 2011-10-08 DIAGNOSIS — B9689 Other specified bacterial agents as the cause of diseases classified elsewhere: Secondary | ICD-10-CM | POA: Insufficient documentation

## 2011-10-08 DIAGNOSIS — A499 Bacterial infection, unspecified: Secondary | ICD-10-CM | POA: Insufficient documentation

## 2011-10-08 DIAGNOSIS — N760 Acute vaginitis: Secondary | ICD-10-CM | POA: Insufficient documentation

## 2011-10-08 DIAGNOSIS — R109 Unspecified abdominal pain: Secondary | ICD-10-CM | POA: Insufficient documentation

## 2011-10-08 LAB — DIFFERENTIAL
Basophils Absolute: 0 10*3/uL (ref 0.0–0.1)
Basophils Relative: 0 % (ref 0–1)
Lymphocytes Relative: 20 % (ref 12–46)
Monocytes Absolute: 0.7 10*3/uL (ref 0.1–1.0)
Neutro Abs: 8.3 10*3/uL — ABNORMAL HIGH (ref 1.7–7.7)
Neutrophils Relative %: 73 % (ref 43–77)

## 2011-10-08 LAB — COMPREHENSIVE METABOLIC PANEL
ALT: 6 U/L (ref 0–35)
AST: 14 U/L (ref 0–37)
Albumin: 2.8 g/dL — ABNORMAL LOW (ref 3.5–5.2)
Alkaline Phosphatase: 78 U/L (ref 39–117)
CO2: 24 mEq/L (ref 19–32)
Chloride: 102 mEq/L (ref 96–112)
GFR calc non Af Amer: >90 mL/min (ref >90)
Potassium: 3.4 mEq/L — ABNORMAL LOW (ref 3.5–5.1)
Sodium: 132 mEq/L — ABNORMAL LOW (ref 135–145)
Total Bilirubin: 0.2 mg/dL — ABNORMAL LOW (ref 0.3–1.2)

## 2011-10-08 LAB — URINALYSIS, ROUTINE W REFLEX MICROSCOPIC
Bilirubin Urine: NEGATIVE
Ketones, ur: NEGATIVE mg/dL
Nitrite: NEGATIVE
pH: 7 (ref 5.0–8.0)

## 2011-10-08 LAB — WET PREP, GENITAL: Yeast Wet Prep HPF POC: NONE SEEN

## 2011-10-08 LAB — CBC
HCT: 37.5 % (ref 36.0–46.0)
MCHC: 33.3 g/dL (ref 30.0–36.0)
Platelets: 263 10*3/uL (ref 150–400)
RDW: 13.8 % (ref 11.5–15.5)
WBC: 11.5 10*3/uL — ABNORMAL HIGH (ref 4.0–10.5)

## 2011-10-08 LAB — URINE MICROSCOPIC-ADD ON

## 2011-10-08 MED ORDER — METRONIDAZOLE 500 MG PO TABS: 500.0000 mg | ORAL_TABLET | Freq: Two times a day (BID) | ORAL | Status: AC

## 2011-10-08 MED ORDER — ACETAMINOPHEN 325 MG PO TABS
650.0000 mg | ORAL_TABLET | Freq: Once | ORAL | Status: AC
  Administered 2011-10-08: 650 mg via ORAL
  Filled 2011-10-08: qty 2

## 2011-10-08 NOTE — MAU Note (Signed)
Pt left MAU without DC instructions.  Pt called & requested that she come back and get her instructions & prescription.  Pt states she will return.

## 2011-10-08 NOTE — MAU Provider Note (Signed)
Medical Screening exam and patient care preformed by advanced practice provider.  Agree with the above management.  

## 2011-10-08 NOTE — MAU Provider Note (Signed)
History     CSN: [id number]  Arrival date and time: 10/08/11 1217   First Provider Initiated Contact with Patient 10/08/11 1346      Chief Complaint  Patient presents with  . Abdominal Pain   HPI  Anita Allen 21 y.o. [redacted]w[redacted]d presents today with complaint of intermittent R sided abdominal pain.  States it increases with movement, such that to find relief "even though I know I'm not supposed to, I lay flat on my back".  Patient scores discomfort at 7/10 and reports positive nausea and diarrhea at this time, but denies vomiting.  Has taken nothing for symptoms.  States has had some spotting and clear vaginal discharge with no odor.  States her last PAP was normal and done 1 month ago, but that she was positive for gonorrhea.  Her and her partner were treated.  States is not getting prenatal care at this time.  OB History    Grav Para Term Preterm Abortions TAB SAB Ect Mult Living   3 1 0 0 1 1 0 0 0 1       Past Medical History  Diagnosis Date  . No pertinent past medical history   . Gonorrhea contact, treated     Past Surgical History  Procedure Date  . No past surgeries     Family History  Problem Relation Age of Onset  . Heart disease Father     History  Substance Use Topics  . Smoking status: Never Smoker   . Smokeless tobacco: Not on file  . Alcohol Use: No    Allergies:  Allergies  Allergen Reactions  . Amoxicillin-Pot Clavulanate Hives  . Latex Rash    Vaginal irritation    No prescriptions prior to admission    Review of Systems  Constitutional: Negative for fever.  HENT: Negative.   Eyes: Negative.   Respiratory: Negative.   Cardiovascular: Negative.   Gastrointestinal: Positive for nausea, abdominal pain and diarrhea. Negative for vomiting and constipation.       See HPI.  Genitourinary: Negative.   Musculoskeletal: Negative.   Skin: Negative.   Neurological: Negative.  Negative for weakness.  Endo/Heme/Allergies: Negative.     Psychiatric/Behavioral: Negative.    Physical Exam   Blood pressure 112/73, pulse 101, temperature 98.5 F (36.9 C), temperature source Oral, resp. rate 16, height 5\' 2"  (1.575 m), weight 73.211 kg (161 lb 6.4 oz), last menstrual period 06/05/2011, SpO2 100.00%.  Physical Exam  Constitutional: She appears well-developed and well-nourished. No distress.  HENT:  Head: Normocephalic and atraumatic.  Neck: Normal range of motion. Neck supple.  Cardiovascular: Normal rate, regular rhythm, normal heart sounds and intact distal pulses.   Respiratory: Effort normal and breath sounds normal. No respiratory distress.  GI: Soft. Bowel sounds are normal. She exhibits no distension and no mass. There is no splenomegaly. There is tenderness. There is guarding. There is no rebound and no CVA tenderness.    Skin: She is not diaphoretic.   Speculum exam: Vagina - Small amount of creamy mucoid discharge, no odor or bleeding noted.  No pooling of fluids. Cervix - No contact bleeding, some cervical irritation present. Bimanual exam: Cervix closed and long with mild softening present. Negative CMT. Uterus non tender, size appropriate for dates. Adnexa non tender, no masses bilaterally. GC/Chlam, wet prep done. Chaperone present for exam.   MAU Course  Procedures  MDM Results for orders placed during the hospital encounter of 10/08/11 (from the past 24 hour(s))  URINALYSIS, ROUTINE  W REFLEX MICROSCOPIC     Status: Abnormal   Collection Time   10/08/11  1:46 PM      Component Value Range   Color, Urine YELLOW  YELLOW    APPearance CLOUDY (*) CLEAR    Specific Gravity, Urine 1.020  1.005 - 1.030    pH 7.0  5.0 - 8.0    Glucose, UA NEGATIVE  NEGATIVE (mg/dL)   Hgb urine dipstick TRACE (*) NEGATIVE    Bilirubin Urine NEGATIVE  NEGATIVE    Ketones, ur NEGATIVE  NEGATIVE (mg/dL)   Protein, ur NEGATIVE  NEGATIVE (mg/dL)   Urobilinogen, UA 0.2  0.0 - 1.0 (mg/dL)   Nitrite NEGATIVE  NEGATIVE     Leukocytes, UA SMALL (*) NEGATIVE   URINE MICROSCOPIC-ADD ON     Status: Abnormal   Collection Time   10/08/11  1:46 PM      Component Value Range   Squamous Epithelial / LPF MANY (*) RARE    WBC, UA 3-6  <3 (WBC/hpf)   RBC / HPF 0-2  <3 (RBC/hpf)   Bacteria, UA FEW (*) RARE    Urine-Other AMORPHOUS URATES/PHOSPHATES    CBC     Status: Abnormal   Collection Time   10/08/11  2:06 PM      Component Value Range   WBC 11.5 (*) 4.0 - 10.5 (K/uL)   RBC 4.46  3.87 - 5.11 (MIL/uL)   Hemoglobin 12.5  12.0 - 15.0 (g/dL)   HCT 40.9  81.1 - 91.4 (%)   MCV 84.1  78.0 - 100.0 (fL)   MCH 28.0  26.0 - 34.0 (pg)   MCHC 33.3  30.0 - 36.0 (g/dL)   RDW 78.2  95.6 - 21.3 (%)   Platelets 263  150 - 400 (K/uL)  DIFFERENTIAL     Status: Abnormal   Collection Time   10/08/11  2:06 PM      Component Value Range   Neutrophils Relative 73  43 - 77 (%)   Neutro Abs 8.3 (*) 1.7 - 7.7 (K/uL)   Lymphocytes Relative 20  12 - 46 (%)   Lymphs Abs 2.3  0.7 - 4.0 (K/uL)   Monocytes Relative 7  3 - 12 (%)   Monocytes Absolute 0.7  0.1 - 1.0 (K/uL)   Eosinophils Relative 1  0 - 5 (%)   Eosinophils Absolute 0.1  0.0 - 0.7 (K/uL)   Basophils Relative 0  0 - 1 (%)   Basophils Absolute 0.0  0.0 - 0.1 (K/uL)  COMPREHENSIVE METABOLIC PANEL     Status: Abnormal   Collection Time   10/08/11  2:06 PM      Component Value Range   Sodium 132 (*) 135 - 145 (mEq/L)   Potassium 3.4 (*) 3.5 - 5.1 (mEq/L)   Chloride 102  96 - 112 (mEq/L)   CO2 24  19 - 32 (mEq/L)   Glucose, Bld 85  70 - 99 (mg/dL)   BUN 5 (*) 6 - 23 (mg/dL)   Creatinine, Ser 0.86 (*) 0.50 - 1.10 (mg/dL)   Calcium 9.7  8.4 - 57.8 (mg/dL)   Total Protein 6.3  6.0 - 8.3 (g/dL)   Albumin 2.8 (*) 3.5 - 5.2 (g/dL)   AST 14  0 - 37 (U/L)   ALT 6  0 - 35 (U/L)   Alkaline Phosphatase 78  39 - 117 (U/L)   Total Bilirubin 0.2 (*) 0.3 - 1.2 (mg/dL)   GFR calc non Af Amer >90  >  90 (mL/min)   GFR calc Af Amer >90  >90 (mL/min)  WET PREP, GENITAL     Status:  Abnormal   Collection Time   10/08/11  2:29 PM      Component Value Range   Yeast Wet Prep HPF POC NONE SEEN  NONE SEEN    Trich, Wet Prep NONE SEEN  NONE SEEN    Clue Cells Wet Prep HPF POC MODERATE (*) NONE SEEN    WBC, Wet Prep HPF POC FEW (*) NONE SEEN      Assessment and Plan   Assessment:  Round Ligament Pain; Bacterial Vaginosis  Differential Diagnoses:   UTI - ruled out through collection Appendicitis - unlikely, wbc only mildly elevated consistent with history and pregnancy Pelvic Infection - GC, Chlamydia, BV Miscarriage - ultrasound confirms FHR of 146. Amniotic fluid within subjective normal limits.  Plan: UA, CBC -  Tylenol for pain Complete ultrasound - no prenatal care; Get set up with Dr. Gaynell Face for prenatal care and return here as needed.   Servando Salina 10/08/2011, 2:04 PM

## 2011-10-08 NOTE — MAU Note (Signed)
Patient states she has had no prenatal care yet. Plans to go to Dr. Gaynell Face. Has been having abdominal pain for about one month. Has a scant discharge all the time and has spotting off and on.

## 2011-10-08 NOTE — Discharge Instructions (Signed)
Bacterial Vaginosis Bacterial vaginosis is an infection of the vagina. A healthy vagina has many kinds of good germs (bacteria). Sometimes the number of good germs can change. This allows bad germs to move in and cause an infection. You may be given medicine (antibiotics) to treat the infection. Or, you may not need treatment at all. HOME CARE  Take your medicine as told. Finish them even if you start to feel better.   Do not have sex until you finish your medicine.   Do not douche.   Practice safe sex.   Tell your sex partner that you have an infection. They should see their doctor for treatment if they have problems.  GET HELP RIGHT AWAY IF:  You do not get better after 3 days of treatment.   You have grey fluid (discharge) coming from your vagina.   You have pain.   You have a temperature of 102 F (38.9 C) or higher.  MAKE SURE YOU:   Understand these instructions.   Will watch your condition.   Will get help right away if you are not doing well or get worse.  Document Released: 02/04/2008 Document Revised: 04/16/2011 Document Reviewed: 02/04/2008 [facility] Patient Information 2012 [place], [place].  Abdominal Pain During Pregnancy Abdominal discomfort is common in pregnancy. Most of the time, it does not cause harm. There are many causes of abdominal pain. Some causes are more serious than others. Some of the causes of abdominal pain in pregnancy are easily diagnosed. Occasionally, the diagnosis takes time to understand. Other times, the cause is not determined. Abdominal pain can be a sign that something is very wrong with the pregnancy, or the pain may have nothing to do with the pregnancy at all. For this reason, always tell your caregiver if you have any abdominal discomfort. CAUSES Common and harmless causes of abdominal pain include:  Constipation.   Excess gas and bloating.   Round ligament pain. This is pain that is felt in the folds of the groin.   The position  the baby or placenta is in.   Baby kicks.   Braxton-Hicks contractions. These are mild contractions that do not cause cervical dilation.  Serious causes of abdominal pain include:  Ectopic pregnancy. This happens when a fertilized egg implants outside of the uterus.   Miscarriage.   Preterm labor. This is when labor starts at less than 37 weeks of pregnancy.   Placental abruption. This is when the placenta partially or completely separates from the uterus.   Preeclampsia. This is often associated with high blood pressure and has been referred to as "toxemia in pregnancy."   Uterine or amniotic fluid infections.  Causes unrelated to pregnancy include:  Urinary tract infection.   Gallbladder stones or inflammation.   Hepatitis or other liver illness.   Intestinal problems, stomach flu, food poisoning, or ulcer.   Appendicitis.   Kidney (renal) stones.   Kidney infection (pylonephritis).  HOME CARE INSTRUCTIONS  For mild pain:  Do not have sexual intercourse or put anything in your vagina until your symptoms go away completely.   Get plenty of rest until your pain improves. If your pain does not improve in 1 hour, call your caregiver.   Drink clear fluids if you feel nauseous. Avoid solid food as long as you are uncomfortable or nauseous.   Only take medicine as directed by your caregiver.   Keep all follow-up appointments with your caregiver.  SEEK IMMEDIATE MEDICAL CARE IF:  You are bleeding, leaking  fluid, or passing tissue from the vagina.   You have increasing pain or cramping.   You have persistent vomiting.   You have painful or bloody urination.   You have a fever.   You notice a decrease in your baby's movements.   You have extreme weakness or feel faint.   You have shortness of breath, with or without abdominal pain.   You develop a severe headache with abdominal pain.   You have abnormal vaginal discharge with abdominal pain.   You have  persistent diarrhea.   You have abdominal pain that continues even after rest, or gets worse.  MAKE SURE YOU:   Understand these instructions.   Will watch your condition.   Will get help right away if you are not doing well or get worse.  Document Released: 04/27/2005 Document Revised: 04/16/2011 Document Reviewed: 11/21/2010 [facility] Patient Information 2012 [place], [place].

## 2011-10-08 NOTE — MAU Note (Signed)
Patient is in with c/o severe lower back without dysuria and intermittent abdominal pain. She c/o spotting but not now. She states that she is awaiting her medicaid card to start prenatal care. She will like to go to dr Gaynell Face. She denies vaginal bleeding or discharge.

## 2011-12-16 ENCOUNTER — Inpatient Hospital Stay (HOSPITAL_COMMUNITY)
Admission: AD | Admit: 2011-12-16 | Discharge: 2011-12-16 | Disposition: A | Discharge status: Home / Self Care | Payer: Medicaid Other | Source: Ambulatory Visit | Attending: Obstetrics | Admitting: Obstetrics

## 2011-12-16 ENCOUNTER — Encounter (HOSPITAL_COMMUNITY): Payer: Self-pay | Admitting: *Deleted

## 2011-12-16 DIAGNOSIS — O2390 Unspecified genitourinary tract infection in pregnancy, unspecified trimester: Secondary | ICD-10-CM | POA: Insufficient documentation

## 2011-12-16 DIAGNOSIS — L293 Anogenital pruritus, unspecified: Secondary | ICD-10-CM | POA: Insufficient documentation

## 2011-12-16 DIAGNOSIS — N644 Mastodynia: Secondary | ICD-10-CM | POA: Insufficient documentation

## 2011-12-16 DIAGNOSIS — B3731 Acute candidiasis of vulva and vagina: Secondary | ICD-10-CM | POA: Insufficient documentation

## 2011-12-16 DIAGNOSIS — B373 Candidiasis of vulva and vagina: Secondary | ICD-10-CM

## 2011-12-16 LAB — WET PREP, GENITAL: Trich, Wet Prep: NONE SEEN

## 2011-12-16 MED ORDER — FLUCONAZOLE 150 MG PO TABS: ORAL_TABLET | ORAL | Status: DC

## 2011-12-16 MED ORDER — FLUCONAZOLE 150 MG PO TABS
150.0000 mg | ORAL_TABLET | Freq: Once | ORAL | Status: AC
  Administered 2011-12-16: 150 mg via ORAL
  Filled 2011-12-16: qty 1

## 2011-12-16 NOTE — MAU Provider Note (Signed)
  History     CSN: [id number]  Arrival date and time: 12/16/11 1800   First Provider Initiated Contact with Patient 12/16/11 1837      Chief Complaint  Patient presents with  . Breast Pain    sharp pains and aches in nipples  . Vaginal Discharge    possible yeast infection   HPI  Pt is [redacted]w[redacted]d pregnant and presents with left breast pain on the inside- hurts when lays on her breast when on that side.  Pt denies fever or chills.  Pt was given 4 pills on Friday at Dr. Elsie Stain  For "Bacterial vaginal infection" and then started having vaginal itching yesterday. Pt denies pain with urination, spotting, bleeding, no cramping.    Past Medical History  Diagnosis Date  . No pertinent past medical history   . Gonorrhea contact, treated     Past Surgical History  Procedure Date  . No past surgeries     Family History  Problem Relation Age of Onset  . Heart disease Father     History  Substance Use Topics  . Smoking status: Never Smoker   . Smokeless tobacco: Not on file  . Alcohol Use: No    Allergies:  Allergies  Allergen Reactions  . Amoxicillin-Pot Clavulanate Hives  . Latex Rash    Vaginal irritation    No prescriptions prior to admission    Review of Systems  Constitutional: Negative for fever and chills.  Cardiovascular: Negative for chest pain.  Gastrointestinal: Negative for nausea, vomiting, abdominal pain, diarrhea and constipation.  Genitourinary: Negative for dysuria and urgency.  Skin: Negative for rash.   Physical Exam   Height 5\' 1"  (1.549 m), weight 74.844 kg (165 lb), last menstrual period 06/05/2011.  Physical Exam  Vitals reviewed. Constitutional: She is oriented to person, place, and time. She appears well-developed and well-nourished.  HENT:  Head: Normocephalic.  Eyes: Pupils are equal, round, and reactive to light.  Neck: Normal range of motion. Neck supple.  Cardiovascular: Normal rate.   Respiratory: Effort normal.       Left  nipple tender to touch; no erythema, no breast firmness or reddness noted  GI: Soft. She exhibits no distension. There is no tenderness. There is no rebound and no guarding.       FHR normal on monitor- no ctx  Genitourinary:       BUS negative- sm-mod amount of white discharge in vault; mucosa reddened; cervix parous NT,closed  Musculoskeletal: Normal range of motion.  Neurological: She is alert and oriented to person, place, and time.  Skin: Skin is warm and dry.  Psychiatric: She has a normal mood and affect.    MAU Course  Procedures Yeast vaginitis-Diflucan given in MAU Results for orders placed during the hospital encounter of 12/16/11 (from the past 24 hour(s))  WET PREP, GENITAL     Status: Abnormal   Collection Time   12/16/11  6:54 PM      Component Value Range   Yeast Wet Prep HPF POC FEW (*) NONE SEEN   Trich, Wet Prep NONE SEEN  NONE SEEN   Clue Cells Wet Prep HPF POC NONE SEEN  NONE SEEN   WBC, Wet Prep HPF POC FEW (*) NONE SEEN   Assessment and Plan  Yeast vaginitis prescription Diflucan 150mg  repeat Saturday and again in 3 days if symptoms persist. Cool compresses to nipple for pain   LINEBERRY,SUSAN 12/16/2011, 6:37 PM

## 2011-12-16 NOTE — Progress Notes (Signed)
Pt states pain feels like when" you breast feed and baby sucks too hard"

## 2011-12-16 NOTE — MAU Note (Signed)
Pt states she has been having pain her left breast for about 1 wk. Pt states pain is constant. Pt also complains of having itching in her vagina pt denies discharge

## 2012-01-12 LAB — OB RESULTS CONSOLE HIV ANTIBODY (ROUTINE TESTING): HIV: NONREACTIVE

## 2012-01-12 LAB — OB RESULTS CONSOLE HEPATITIS B SURFACE ANTIGEN: Hepatitis B Surface Ag: NEGATIVE

## 2012-01-28 ENCOUNTER — Encounter (HOSPITAL_COMMUNITY): Payer: Self-pay | Admitting: Nurse Practitioner

## 2012-02-01 ENCOUNTER — Inpatient Hospital Stay (HOSPITAL_COMMUNITY)
Admission: AD | Admit: 2012-02-01 | Discharge: 2012-02-02 | Disposition: A | Discharge status: Home / Self Care | Payer: Medicaid Other | Source: Ambulatory Visit | Attending: Obstetrics | Admitting: Obstetrics

## 2012-02-01 ENCOUNTER — Encounter (HOSPITAL_COMMUNITY): Payer: Self-pay | Admitting: *Deleted

## 2012-02-01 DIAGNOSIS — O4700 False labor before 37 completed weeks of gestation, unspecified trimester: Secondary | ICD-10-CM

## 2012-02-01 LAB — URINALYSIS, ROUTINE W REFLEX MICROSCOPIC
Bilirubin Urine: NEGATIVE
Glucose, UA: NEGATIVE mg/dL
Ketones, ur: NEGATIVE mg/dL
Nitrite: NEGATIVE
Protein, ur: NEGATIVE mg/dL
pH: 6 (ref 5.0–8.0)

## 2012-02-01 LAB — URINE MICROSCOPIC-ADD ON

## 2012-02-01 NOTE — MAU Provider Note (Signed)
Chief Complaint:  Labor Eval   First Provider Initiated Contact with Patient 02/01/12 2344     HPI: Anita Allen is a 21 y.o. G3P0011 at 3w6dwho presents to maternity admissions reporting mild contractions this evening. Denies leakage of fluid or vaginal bleeding. Good fetal movement.   Past Medical History: Past Medical History  Diagnosis Date  . No pertinent past medical history   . Gonorrhea contact, treated     Past obstetric history: OB History    Grav Para Term Preterm Abortions TAB SAB Ect Mult Living   3 1 1  0 1 1 0 0 0 1     # Outc Date GA Lbr Len/2nd Wgt Sex Del Anes PTL Lv   1 TAB 2010 [redacted]w[redacted]d          2 TRM 2012 [redacted]w[redacted]d   F SVD   Yes   3 CUR               Past Surgical History: Past Surgical History  Procedure Date  . No past surgeries     Family History: Family History  Problem Relation Age of Onset  . Heart disease Father     Social History: History  Substance Use Topics  . Smoking status: Never Smoker   . Smokeless tobacco: Not on file  . Alcohol Use: No    Allergies:  Allergies  Allergen Reactions  . Amoxicillin-Pot Clavulanate Hives  . Latex Rash    Vaginal irritation    Meds:  Prescriptions prior to admission  Medication Sig Dispense Refill  . acetaminophen-codeine (TYLENOL #4) 300-60 MG per tablet Take 1 tablet by mouth every 4 (four) hours as needed.      . fluconazole (DIFLUCAN) 150 MG tablet Take one tablet on Saturday if still having symptoms- may repeat 3 days later if needed  2 tablet  0  . Multiple Vitamin (MULTIVITAMIN) tablet Take 1 tablet by mouth daily.        ROS: Pertinent findings in history of present illness.  Physical Exam  Blood pressure 121/69, pulse 87, temperature 98.4 F (36.9 C), resp. rate 18, height 5\' 1"  (1.549 m), weight 79.833 kg (176 lb), last menstrual period 06/05/2011, SpO2 100.00%. GENERAL: Well-developed, well-nourished female in no acute distress.  HEENT: normocephalic HEART: normal rate RESP:  normal effort ABDOMEN: Soft, non-tender, gravid appropriate for gestational age EXTREMITIES: Nontender, no edema NEURO: alert and oriented SPECULUM EXAM: NEFG, deferred Dilation: 1 Effacement (%): Thick Station: Ballotable Presentation: Vertex Exam by:: Leane Loring, CNM  FHT:  Baseline 140 , moderate variability, accelerations present, no decelerations Contractions: q 3 mins, UI   Labs: No results found for this or any previous visit (from the past 24 hour(s)).  Imaging:  No results found. ED Course   Assessment: 1. Preterm uterine contractions, antepartum     Plan: Discharge home Labor precautions and fetal kick counts     Follow-up Information    Follow up with Kathreen Cosier, MD. On 02/02/2012.   Contact information:   [address] [place] [address] [phone]       Follow up with [facility] [place] [facility]. (As needed if symptoms worsen)    Contact information:   [address] [place] [address] [phone]          Medication List     As of 02/01/2012 11:53 PM    TAKE these medications         acetaminophen-codeine 300-60 MG  per tablet   Commonly known as: TYLENOL #4   Take 1 tablet by mouth every 4 (four) hours as needed.      fluconazole 150 MG tablet   Commonly known as: DIFLUCAN   Take one tablet on Saturday if still having symptoms- may repeat 3 days later if needed      multivitamin tablet   Take 1 tablet by mouth daily.         [place], [place] 02/01/2012 11:53 PM

## 2012-02-01 NOTE — MAU Note (Signed)
HAS DR APPOINTMENT   TODAY.

## 2012-02-01 NOTE — MAU Note (Signed)
Contractions, denies problems with pregnancy

## 2012-02-01 NOTE — MAU Note (Signed)
PT SAYS R SIDED PAIN HAS BEEN   ALL  DAY- BUT BECAME WORSE AT  10PM.      LAST SEX-   LAST WEEK.   DENIES HSV AND MRSA

## 2012-02-24 ENCOUNTER — Encounter (HOSPITAL_COMMUNITY): Payer: Self-pay | Admitting: Anesthesiology

## 2012-02-24 ENCOUNTER — Inpatient Hospital Stay (HOSPITAL_COMMUNITY): Payer: Medicaid Other | Admitting: Anesthesiology

## 2012-02-24 ENCOUNTER — Encounter (HOSPITAL_COMMUNITY): Payer: Self-pay

## 2012-02-24 ENCOUNTER — Inpatient Hospital Stay (HOSPITAL_COMMUNITY)
Admission: RE | Admit: 2012-02-24 | Discharge: 2012-02-26 | DRG: 775 | Disposition: A | Discharge status: Home / Self Care | Payer: Medicaid Other | Source: Ambulatory Visit | Attending: Obstetrics | Admitting: Obstetrics

## 2012-02-24 LAB — CBC
MCV: 78.5 fL (ref 78.0–100.0)
Platelets: 310 10*3/uL (ref 150–400)
RDW: 15.2 % (ref 11.5–15.5)
WBC: 12.8 10*3/uL — ABNORMAL HIGH (ref 4.0–10.5)

## 2012-02-24 LAB — RPR: RPR Ser Ql: NONREACTIVE

## 2012-02-24 MED ORDER — ACETAMINOPHEN 325 MG PO TABS: 650.0000 mg | ORAL_TABLET | ORAL | Status: DC | PRN

## 2012-02-24 MED ORDER — PHENYLEPHRINE 40 MCG/ML (10ML) SYRINGE FOR IV PUSH (FOR BLOOD PRESSURE SUPPORT)
80.0000 ug | PREFILLED_SYRINGE | INTRAVENOUS | Status: DC | PRN
  Filled 2012-02-24: qty 5

## 2012-02-24 MED ORDER — LACTATED RINGERS IV SOLN
INTRAVENOUS | Status: DC
  Administered 2012-02-24 (×2): via INTRAVENOUS

## 2012-02-24 MED ORDER — OXYCODONE-ACETAMINOPHEN 5-325 MG PO TABS: 1.0000 | ORAL_TABLET | ORAL | Status: DC | PRN

## 2012-02-24 MED ORDER — ZOLPIDEM TARTRATE 5 MG PO TABS: 5.0000 mg | ORAL_TABLET | Freq: Every evening | ORAL | Status: DC | PRN

## 2012-02-24 MED ORDER — CITRIC ACID-SODIUM CITRATE 334-500 MG/5ML PO SOLN: 30.0000 mL | ORAL | Status: DC | PRN

## 2012-02-24 MED ORDER — FENTANYL 2.5 MCG/ML BUPIVACAINE 1/10 % EPIDURAL INFUSION (WH - ANES)
INTRAMUSCULAR | Status: DC | PRN
  Administered 2012-02-24: 14 mL/h via EPIDURAL

## 2012-02-24 MED ORDER — EPHEDRINE 5 MG/ML INJ
10.0000 mg | INTRAVENOUS | Status: DC | PRN
  Filled 2012-02-24: qty 4

## 2012-02-24 MED ORDER — IBUPROFEN 600 MG PO TABS
600.0000 mg | ORAL_TABLET | Freq: Four times a day (QID) | ORAL | Status: DC
  Administered 2012-02-24 – 2012-02-26 (×7): 600 mg via ORAL
  Filled 2012-02-24 (×6): qty 1

## 2012-02-24 MED ORDER — OXYTOCIN 40 UNITS IN LACTATED RINGERS INFUSION - SIMPLE MED
62.5000 mL/h | Freq: Once | INTRAVENOUS | Status: DC
  Filled 2012-02-24: qty 1000

## 2012-02-24 MED ORDER — FERROUS SULFATE 325 (65 FE) MG PO TABS
325.0000 mg | ORAL_TABLET | Freq: Two times a day (BID) | ORAL | Status: DC
  Administered 2012-02-25 – 2012-02-26 (×3): 325 mg via ORAL
  Filled 2012-02-24 (×3): qty 1

## 2012-02-24 MED ORDER — FENTANYL CITRATE 0.05 MG/ML IJ SOLN
50.0000 ug | Freq: Once | INTRAMUSCULAR | Status: AC
  Administered 2012-02-24: 50 ug via EPIDURAL

## 2012-02-24 MED ORDER — ONDANSETRON HCL 4 MG/2ML IJ SOLN: 4.0000 mg | INTRAMUSCULAR | Status: DC | PRN

## 2012-02-24 MED ORDER — OXYTOCIN 40 UNITS IN LACTATED RINGERS INFUSION - SIMPLE MED
1.0000 m[IU]/min | INTRAVENOUS | Status: DC
  Administered 2012-02-24: 2 m[IU]/min via INTRAVENOUS

## 2012-02-24 MED ORDER — PHENYLEPHRINE 40 MCG/ML (10ML) SYRINGE FOR IV PUSH (FOR BLOOD PRESSURE SUPPORT): 80.0000 ug | PREFILLED_SYRINGE | INTRAVENOUS | Status: DC | PRN

## 2012-02-24 MED ORDER — LIDOCAINE HCL (PF) 1 % IJ SOLN
INTRAMUSCULAR | Status: DC | PRN
  Administered 2012-02-24 (×2): 9 mL

## 2012-02-24 MED ORDER — FENTANYL CITRATE 0.05 MG/ML IJ SOLN
INTRAMUSCULAR | Status: DC | PRN
  Administered 2012-02-24: 50 ug via EPIDURAL

## 2012-02-24 MED ORDER — OXYTOCIN BOLUS FROM INFUSION
500.0000 mL | Freq: Once | INTRAVENOUS | Status: DC
  Filled 2012-02-24: qty 500

## 2012-02-24 MED ORDER — DIBUCAINE 1 % RE OINT: 1.0000 "application " | TOPICAL_OINTMENT | RECTAL | Status: DC | PRN

## 2012-02-24 MED ORDER — SODIUM BICARBONATE 8.4 % IV SOLN
INTRAVENOUS | Status: DC | PRN
  Administered 2012-02-24: 5 mL via EPIDURAL

## 2012-02-24 MED ORDER — BENZOCAINE-MENTHOL 20-0.5 % EX AERO: 1.0000 "application " | INHALATION_SPRAY | CUTANEOUS | Status: DC | PRN

## 2012-02-24 MED ORDER — ONDANSETRON HCL 4 MG/2ML IJ SOLN: 4.0000 mg | Freq: Four times a day (QID) | INTRAMUSCULAR | Status: DC | PRN

## 2012-02-24 MED ORDER — ONDANSETRON HCL 4 MG PO TABS: 4.0000 mg | ORAL_TABLET | ORAL | Status: DC | PRN

## 2012-02-24 MED ORDER — SENNOSIDES-DOCUSATE SODIUM 8.6-50 MG PO TABS
2.0000 | ORAL_TABLET | Freq: Every day | ORAL | Status: DC
  Administered 2012-02-25: 2 via ORAL

## 2012-02-24 MED ORDER — FENTANYL 2.5 MCG/ML BUPIVACAINE 1/10 % EPIDURAL INFUSION (WH - ANES)
14.0000 mL/h | INTRAMUSCULAR | Status: DC
  Filled 2012-02-24: qty 125

## 2012-02-24 MED ORDER — TETANUS-DIPHTH-ACELL PERTUSSIS 5-2.5-18.5 LF-MCG/0.5 IM SUSP
0.5000 mL | Freq: Once | INTRAMUSCULAR | Status: AC
  Administered 2012-02-25: 0.5 mL via INTRAMUSCULAR

## 2012-02-24 MED ORDER — IBUPROFEN 600 MG PO TABS
600.0000 mg | ORAL_TABLET | Freq: Four times a day (QID) | ORAL | Status: DC | PRN
  Filled 2012-02-24: qty 1

## 2012-02-24 MED ORDER — FLEET ENEMA 7-19 GM/118ML RE ENEM: 1.0000 | ENEMA | RECTAL | Status: DC | PRN

## 2012-02-24 MED ORDER — FENTANYL CITRATE 0.05 MG/ML IJ SOLN
INTRAMUSCULAR | Status: AC
  Filled 2012-02-24: qty 2

## 2012-02-24 MED ORDER — LACTATED RINGERS IV SOLN: 500.0000 mL | INTRAVENOUS | Status: DC | PRN

## 2012-02-24 MED ORDER — TERBUTALINE SULFATE 1 MG/ML IJ SOLN: 0.2500 mg | Freq: Once | INTRAMUSCULAR | Status: DC | PRN

## 2012-02-24 MED ORDER — SIMETHICONE 80 MG PO CHEW: 80.0000 mg | CHEWABLE_TABLET | ORAL | Status: DC | PRN

## 2012-02-24 MED ORDER — DIPHENHYDRAMINE HCL 50 MG/ML IJ SOLN: 12.5000 mg | INTRAMUSCULAR | Status: DC | PRN

## 2012-02-24 MED ORDER — PRENATAL MULTIVITAMIN CH
1.0000 | ORAL_TABLET | Freq: Every day | ORAL | Status: DC
  Administered 2012-02-25 – 2012-02-26 (×2): 1 via ORAL
  Filled 2012-02-24 (×2): qty 1

## 2012-02-24 MED ORDER — LANOLIN HYDROUS EX OINT: TOPICAL_OINTMENT | CUTANEOUS | Status: DC | PRN

## 2012-02-24 MED ORDER — LIDOCAINE HCL (PF) 1 % IJ SOLN
30.0000 mL | INTRAMUSCULAR | Status: DC | PRN
  Filled 2012-02-24: qty 30

## 2012-02-24 MED ORDER — WITCH HAZEL-GLYCERIN EX PADS: 1.0000 "application " | MEDICATED_PAD | CUTANEOUS | Status: DC | PRN

## 2012-02-24 MED ORDER — OXYCODONE-ACETAMINOPHEN 5-325 MG PO TABS
1.0000 | ORAL_TABLET | ORAL | Status: DC | PRN
  Administered 2012-02-25 (×2): 1 via ORAL
  Filled 2012-02-24 (×2): qty 1

## 2012-02-24 MED ORDER — EPHEDRINE 5 MG/ML INJ: 10.0000 mg | INTRAVENOUS | Status: DC | PRN

## 2012-02-24 MED ORDER — LACTATED RINGERS IV SOLN: 500.0000 mL | Freq: Once | INTRAVENOUS | Status: DC

## 2012-02-24 MED ORDER — BUTORPHANOL TARTRATE 1 MG/ML IJ SOLN
1.0000 mg | INTRAMUSCULAR | Status: DC | PRN
  Administered 2012-02-24 (×2): 1 mg via INTRAVENOUS
  Filled 2012-02-24 (×2): qty 1

## 2012-02-24 MED ORDER — DIPHENHYDRAMINE HCL 25 MG PO CAPS: 25.0000 mg | ORAL_CAPSULE | Freq: Four times a day (QID) | ORAL | Status: DC | PRN

## 2012-02-24 NOTE — Progress Notes (Signed)
Transferred pt to room 127 via wheelchair, family at bedside. Baby in mothers arms feeding in route to room.

## 2012-02-24 NOTE — H&P (Signed)
This is Dr. Francoise Ceo dictating the history and physical on  Anita Allen she's a 21 year old gravida 3 para 1011 at 20 weeks and 1 day EDC 1020 1213 and she desires induction negative GBS her cervix is 2 cm 80% vertex -2-3 amniotomy performed and the fluid was clear she is on low-dose Pitocin and having irregular contractions Os medical history negative Past surgical history negative Social history negative System review noncontributory Physical exam well-developed female in labor HEENT negative Heart regular rhythm no murmurs no gallops Lungs clear to P&A Breasts negative Abdomen term Pelvic as described above Extremities negative

## 2012-02-24 NOTE — Anesthesia Preprocedure Evaluation (Addendum)
Anesthesia Evaluation  Patient identified by MRN, date of birth, ID band Patient awake    Reviewed: Allergy & Precautions, H&P , NPO status , Patient's Chart, lab work & pertinent test results  Airway Mallampati: II TM Distance: >3 FB Neck ROM: full    Dental No notable dental hx.    Pulmonary neg pulmonary ROS,    Pulmonary exam normal       Cardiovascular negative cardio ROS      Neuro/Psych negative neurological ROS  negative psych ROS   GI/Hepatic negative GI ROS, Neg liver ROS,   Endo/Other  negative endocrine ROS  Renal/GU negative Renal ROS  negative genitourinary   Musculoskeletal negative musculoskeletal ROS (+)   Abdominal Normal abdominal exam  (+)   Peds negative pediatric ROS (+)  Hematology negative hematology ROS (+)   Anesthesia Other Findings   Reproductive/Obstetrics (+) Pregnancy                           Anesthesia Physical Anesthesia Plan  ASA: II  Anesthesia Plan: Epidural   Post-op Pain Management:    Induction:   Airway Management Planned:   Additional Equipment:   Intra-op Plan:   Post-operative Plan:   Informed Consent: I have reviewed the patients History and Physical, chart, labs and discussed the procedure including the risks, benefits and alternatives for the proposed anesthesia with the patient or authorized representative who has indicated his/her understanding and acceptance.     Plan Discussed with:   Anesthesia Plan Comments:         Anesthesia Quick Evaluation  

## 2012-02-24 NOTE — Anesthesia Procedure Notes (Signed)
Epidural Patient location during procedure: OB Start time: 02/24/2012 4:08 PM End time: 02/24/2012 4:12 PM  Staffing Anesthesiologist: Sandrea Hughs Performed by: anesthesiologist   Preanesthetic Checklist Completed: patient identified, site marked, surgical consent, pre-op evaluation, timeout performed, IV checked, risks and benefits discussed and monitors and equipment checked  Epidural Patient position: sitting Prep: site prepped and draped and DuraPrep Patient monitoring: continuous pulse ox and blood pressure Approach: midline Injection technique: LOR air  Needle:  Needle type: Tuohy  Needle gauge: 17 G Needle length: 9 cm and 9 Needle insertion depth: 6 cm Catheter type: closed end flexible Catheter size: 19 Gauge Catheter at skin depth: 11 cm Test dose: negative and Other  Assessment Sensory level: T8 Events: blood not aspirated, injection not painful, no injection resistance, negative IV test and no paresthesia  Additional Notes Reason for block:procedure for pain

## 2012-02-25 LAB — CBC
HCT: 31.6 % — ABNORMAL LOW (ref 36.0–46.0)
MCH: 25.7 pg — ABNORMAL LOW (ref 26.0–34.0)
MCV: 78.8 fL (ref 78.0–100.0)
Platelets: 319 10*3/uL (ref 150–400)
RBC: 4.01 MIL/uL (ref 3.87–5.11)

## 2012-02-25 MED ORDER — INFLUENZA VIRUS VACC SPLIT PF IM SUSP
0.5000 mL | INTRAMUSCULAR | Status: AC
  Administered 2012-02-26: 0.5 mL via INTRAMUSCULAR
  Filled 2012-02-25: qty 0.5

## 2012-02-25 NOTE — Progress Notes (Signed)
Patient ID: Anita Allen, female   DOB: May 23, 1990, 21 y.o.   MRN: [id number] Postpartum day one Vital signs normal Fundus firm Legs negative Doing well

## 2012-02-25 NOTE — Anesthesia Postprocedure Evaluation (Signed)
Anesthesia Post Note  Patient: Anita Allen  Procedure(s) Performed: * No procedures listed *  Anesthesia type: Epidural  Patient location: Mother/Baby  Post pain: Pain level controlled  Post assessment: Post-op Vital signs reviewed  Last Vitals:  Filed Vitals:   02/25/12 0620  BP: 106/57  Pulse: 83  Temp: 36.8 C  Resp: 18    Post vital signs: Reviewed  Level of consciousness: awake  Complications: No apparent anesthesia complications

## 2012-02-25 NOTE — Progress Notes (Signed)
UR Chart review completed.  

## 2012-02-26 NOTE — Discharge Summary (Signed)
Obstetric Discharge Summary Reason for Admission: onset of labor Prenatal Procedures: none Intrapartum Procedures: spontaneous vaginal delivery Postpartum Procedures: none Complications-Operative and Postpartum: none Hemoglobin  Date Value Range Status  02/25/2012 10.3* 12.0 - 15.0 g/dL Final     HCT  Date Value Range Status  02/25/2012 31.6* 36.0 - 46.0 % Final    Physical Exam:  General: alert Lochia: appropriate Uterine Fundus: firm Incision: healing well DVT Evaluation: No evidence of DVT seen on physical exam.  Discharge Diagnoses: Term Pregnancy-delivered  Discharge Information: Date: 02/26/2012 Activity: pelvic rest Diet: routine Medications: Percocet Condition: stable Instructions: refer to practice specific booklet Discharge to: home Follow-up Information    Schedule an appointment as soon as possible for a visit to follow up.   Contact information:   b Man[address] Deboer         Newborn Data: Live born female  Birth Weight: 6 lb 12.1 oz (3065 g) APGAR: 8, 9  Home with mother.  Javien Tesch A 02/26/2012, 7:14 AM

## 2012-09-26 ENCOUNTER — Emergency Department (INDEPENDENT_AMBULATORY_CARE_PROVIDER_SITE_OTHER)
Admission: EM | Admit: 2012-09-26 | Discharge: 2012-09-26 | Disposition: A | Discharge status: Home / Self Care | Payer: Medicaid Other | Source: Home / Self Care

## 2012-09-26 ENCOUNTER — Encounter (HOSPITAL_COMMUNITY): Payer: Self-pay | Admitting: Emergency Medicine

## 2012-09-26 DIAGNOSIS — J309 Allergic rhinitis, unspecified: Secondary | ICD-10-CM

## 2012-09-26 DIAGNOSIS — A499 Bacterial infection, unspecified: Secondary | ICD-10-CM

## 2012-09-26 DIAGNOSIS — N760 Acute vaginitis: Secondary | ICD-10-CM

## 2012-09-26 LAB — POCT URINALYSIS DIP (DEVICE)
Bilirubin Urine: NEGATIVE
Ketones, ur: NEGATIVE mg/dL
Leukocytes, UA: NEGATIVE
Nitrite: NEGATIVE
Protein, ur: 30 mg/dL — AB

## 2012-09-26 LAB — POCT PREGNANCY, URINE: Preg Test, Ur: NEGATIVE

## 2012-09-26 MED ORDER — METRONIDAZOLE 500 MG PO TABS: 500.0000 mg | ORAL_TABLET | Freq: Two times a day (BID) | ORAL | Status: DC

## 2012-09-26 MED ORDER — FEXOFENADINE HCL 180 MG PO TABS: 180.0000 mg | ORAL_TABLET | Freq: Every day | ORAL | Status: DC

## 2012-09-26 MED ORDER — FLUTICASONE PROPIONATE 50 MCG/ACT NA SUSP: 2.0000 | Freq: Every day | NASAL | Status: DC

## 2012-09-26 NOTE — ED Provider Notes (Signed)
History     CSN: [id number]  Arrival date & time 09/26/12  1241   None     Chief Complaint  Patient presents with  . Vaginal Discharge    (Consider location/radiation/quality/duration/timing/severity/associated sxs/prior treatment) HPI Comments: 22 year old female presents with "a cold for 2 weeks. She states she initially had a change in voice but now it is returning. The most worrisome symptom is that of a cough that is worse at night. She occasionally has runny nose but significant amount of PND. Denies fever, chills, earache or dyspnea.  Her second complaint is that of a vaginal discharge for several days. Is a fishy odor and is gray and 10. Does have a history of this before and it was called bacterial vaginosis. She denies pelvic pain or vaginal pain.   Past Medical History  Diagnosis Date  . No pertinent past medical history   . Gonorrhea contact, treated     Past Surgical History  Procedure Laterality Date  . No past surgeries      Family History  Problem Relation Age of Onset  . Heart disease Father     History  Substance Use Topics  . Smoking status: Never Smoker   . Smokeless tobacco: Not on file  . Alcohol Use: No    OB History   Grav Para Term Preterm Abortions TAB SAB Ect Mult Living   3 2 2  0 1 1 0 0 0 2      Review of Systems  Constitutional: Negative.   HENT: Positive for congestion, rhinorrhea and postnasal drip. Negative for ear pain, mouth sores and neck stiffness.   Eyes: Negative.   Respiratory: Positive for cough. Negative for shortness of breath.   Cardiovascular: Negative.   Gastrointestinal: Negative.   Genitourinary: Positive for vaginal discharge. Negative for dysuria, frequency, hematuria and pelvic pain.  Neurological: Negative.     Allergies  Amoxicillin-pot clavulanate and Latex  Home Medications   Current Outpatient Rx  Name  Route  Sig  Dispense  Refill  . guaiFENesin (ROBITUSSIN) 100 MG/5ML SOLN   Oral   Take 5  mLs by mouth every 4 (four) hours as needed.         . fexofenadine (ALLEGRA) 180 MG tablet   Oral   Take 1 tablet (180 mg total) by mouth daily.   14 tablet   0   . fluticasone (FLONASE) 50 MCG/ACT nasal spray   Nasal   Place 2 sprays into the nose daily.   16 g   2   . metroNIDAZOLE (FLAGYL) 500 MG tablet   Oral   Take 1 tablet (500 mg total) by mouth 2 (two) times daily. X 7 days   14 tablet   0     BP 111/72  Pulse 90  Temp(Src) 99 F (37.2 C) (Oral)  Resp 18  SpO2 98%  LMP 08/27/2012  Physical Exam  Nursing note and vitals reviewed. Constitutional: She is oriented to person, place, and time. She appears well-developed and well-nourished. No distress.  HENT:  Oropharynx with erythema, cobblestoning and clear PND. Bilateral TMs are normal  Eyes: Conjunctivae and EOM are normal. Pupils are equal, round, and reactive to light.  Neck: Normal range of motion. Neck supple.  Cardiovascular: Normal rate, regular rhythm and normal heart sounds.   Pulmonary/Chest: Effort normal and breath sounds normal. No respiratory distress. She has no wheezes. She has no rales.  Abdominal: Soft. There is no tenderness.  Musculoskeletal: Normal range of motion. She  exhibits no edema.  Lymphadenopathy:    She has no cervical adenopathy.  Neurological: She is alert and oriented to person, place, and time.  Skin: Skin is warm and dry. No rash noted.  Psychiatric: She has a normal mood and affect.    ED Course  Procedures (including critical care time)  Labs Reviewed  POCT URINALYSIS DIP (DEVICE) - Abnormal; Notable for the following:    Hgb urine dipstick MODERATE (*)    Protein, ur 30 (*)    All other components within normal limits   No results found.  Results for orders placed during the hospital encounter of 09/26/12  POCT URINALYSIS DIP (DEVICE)      Result Value Range   Glucose, UA NEGATIVE  NEGATIVE mg/dL   Bilirubin Urine NEGATIVE  NEGATIVE   Ketones, ur NEGATIVE   NEGATIVE mg/dL   Specific Gravity, Urine 1.025  1.005 - 1.030   Hgb urine dipstick MODERATE (*) NEGATIVE   pH 5.5  5.0 - 8.0   Protein, ur 30 (*) NEGATIVE mg/dL   Urobilinogen, UA 1.0  0.0 - 1.0 mg/dL   Nitrite NEGATIVE  NEGATIVE   Leukocytes, UA NEGATIVE  NEGATIVE      1. Allergic rhinitis due to allergen   2. BV (bacterial vaginosis)       MDM  Allegra 180 mg daily for allergies To do some nasal spray as directed Sudafed PE 10 mg every 4 hours when necessary congestion Delsym when necessary cough Flagyl 500 mg twice a day for 7 days        Hayden Rasmussen, NP 09/26/12 1401

## 2012-09-26 NOTE — ED Notes (Signed)
Vaginal discharge, with odor.  Also c/o cold symptoms.  Intermittently has lost voice , frequent cough at night.

## 2012-09-26 NOTE — ED Notes (Signed)
Dr badger is pcp.

## 2012-09-26 NOTE — ED Provider Notes (Signed)
Medical screening examination/treatment/procedure(s) were performed by non-physician practitioner and as supervising physician I was immediately available for consultation/collaboration.   MORENO-COLL,Muscab Brenneman; MD  Maverick Patman Moreno-Coll, MD 09/26/12 1656 

## 2012-09-26 NOTE — ED Notes (Signed)
Patient being seen with two minor children.

## 2013-02-09 ENCOUNTER — Inpatient Hospital Stay (HOSPITAL_COMMUNITY)
Admission: AD | Admit: 2013-02-09 | Discharge: 2013-02-09 | Disposition: A | Discharge status: Home / Self Care | Payer: Medicaid Other | Source: Ambulatory Visit | Attending: Obstetrics and Gynecology | Admitting: Obstetrics and Gynecology

## 2013-02-09 ENCOUNTER — Encounter (HOSPITAL_COMMUNITY): Payer: Self-pay | Admitting: *Deleted

## 2013-02-09 DIAGNOSIS — A499 Bacterial infection, unspecified: Secondary | ICD-10-CM | POA: Insufficient documentation

## 2013-02-09 DIAGNOSIS — N949 Unspecified condition associated with female genital organs and menstrual cycle: Secondary | ICD-10-CM | POA: Insufficient documentation

## 2013-02-09 DIAGNOSIS — M549 Dorsalgia, unspecified: Secondary | ICD-10-CM | POA: Insufficient documentation

## 2013-02-09 DIAGNOSIS — T148XXA Other injury of unspecified body region, initial encounter: Secondary | ICD-10-CM

## 2013-02-09 DIAGNOSIS — B9689 Other specified bacterial agents as the cause of diseases classified elsewhere: Secondary | ICD-10-CM | POA: Insufficient documentation

## 2013-02-09 DIAGNOSIS — N760 Acute vaginitis: Secondary | ICD-10-CM | POA: Insufficient documentation

## 2013-02-09 LAB — URINALYSIS, ROUTINE W REFLEX MICROSCOPIC
Bilirubin Urine: NEGATIVE
Nitrite: NEGATIVE
Protein, ur: NEGATIVE mg/dL
Specific Gravity, Urine: >1.03 — ABNORMAL HIGH (ref 1.005–1.030)
Urobilinogen, UA: 0.2 mg/dL (ref 0.0–1.0)

## 2013-02-09 LAB — WET PREP, GENITAL: Trich, Wet Prep: NONE SEEN

## 2013-02-09 LAB — URINE MICROSCOPIC-ADD ON

## 2013-02-09 MED ORDER — CYCLOBENZAPRINE HCL 10 MG PO TABS: 10.0000 mg | ORAL_TABLET | Freq: Two times a day (BID) | ORAL | Status: DC | PRN

## 2013-02-09 MED ORDER — CYCLOBENZAPRINE HCL 10 MG PO TABS
10.0000 mg | ORAL_TABLET | Freq: Once | ORAL | Status: AC
  Administered 2013-02-09: 10 mg via ORAL
  Filled 2013-02-09: qty 1

## 2013-02-09 MED ORDER — KETOROLAC TROMETHAMINE 10 MG PO TABS
10.0000 mg | ORAL_TABLET | Freq: Once | ORAL | Status: AC
  Administered 2013-02-09: 10 mg via ORAL
  Filled 2013-02-09: qty 1

## 2013-02-09 MED ORDER — METRONIDAZOLE 500 MG PO TABS: 500.0000 mg | ORAL_TABLET | Freq: Two times a day (BID) | ORAL | Status: DC

## 2013-02-09 NOTE — MAU Note (Signed)
Patient states she she has been having a vaginal discharge with an odor for a while. Has been treated for BV but still comes back. Has been having back pain for about one week that started at the upper back and goes down to about the waist level.

## 2013-02-09 NOTE — MAU Note (Signed)
Pt states she has been having back pain. Pt also states she vaginal discharge with an odor. Discharge has been going on for months pt states she has been diagnoised with BV more than once and she has reocurring BV after taking medication

## 2013-02-09 NOTE — MAU Provider Note (Signed)
History     CSN: [id number]  Arrival date and time: 02/09/13 1716   First Provider Initiated Contact with Patient 02/09/13 1859      Chief Complaint  Patient presents with  . Possible Pregnancy  . Vaginal Discharge  . Back Pain   HPI  Anita Allen is a 22 y.o. [id number] who presents today with vaginal discharge with odor. She is also having 6/10 back pain for a week. She denies any urinary symptoms. She states that she does have bleeding after intercourse sometimes. She also states that she was told that she has kidney stones by a doctor in [place], but she does not feel this back is the same as the pain she had at that time. The pain is located from the neck to the mid-back. She states that she took ibuprofen earlier this week, but it did not help.   Past Medical History  Diagnosis Date  . No pertinent past medical history   . Gonorrhea contact, treated     Past Surgical History  Procedure Laterality Date  . No past surgeries      Family History  Problem Relation Age of Onset  . Heart disease Father     History  Substance Use Topics  . Smoking status: Never Smoker   . Smokeless tobacco: Not on file  . Alcohol Use: No    Allergies:  Allergies  Allergen Reactions  . Amoxicillin-Pot Clavulanate Hives  . Latex Rash    Vaginal irritation    Prescriptions prior to admission  Medication Sig Dispense Refill  . fexofenadine (ALLEGRA) 180 MG tablet Take 1 tablet (180 mg total) by mouth daily.  14 tablet  0  . fluticasone (FLONASE) 50 MCG/ACT nasal spray Place 2 sprays into the nose daily.  16 g  2  . guaiFENesin (ROBITUSSIN) 100 MG/5ML SOLN Take 5 mLs by mouth every 4 (four) hours as needed.      . metroNIDAZOLE (FLAGYL) 500 MG tablet Take 1 tablet (500 mg total) by mouth 2 (two) times daily. X 7 days  14 tablet  0    ROS Physical Exam   Blood pressure 103/71, pulse 79, temperature 98.4 F (36.9 C), temperature source Oral, resp. rate 16, height 5' 2.5" (1.588  m), weight 71.759 kg (158 lb 3.2 oz), last menstrual period 01/05/2013, SpO2 100.00%.  Physical Exam  Nursing note and vitals reviewed. Constitutional: She is oriented to person, place, and time. She appears well-developed and well-nourished. No distress.  Neck:  Muscle tightness in neck and upper back   Cardiovascular: Normal rate.   Respiratory: Effort normal.  GI: Soft. There is no tenderness.  Genitourinary:  No CVA tenderness  External: no lesion Vagina: small amount of white discharge Cervix: pink, smooth, no CMT Uterus: NSSC Adnexa: NT   Neurological: She is alert and oriented to person, place, and time.  Skin: Skin is warm.  Psychiatric: She has a normal mood and affect.    MAU Course  Procedures  Results for orders placed during the hospital encounter of 02/09/13 (from the past 24 hour(s))  URINALYSIS, ROUTINE W REFLEX MICROSCOPIC     Status: Abnormal   Collection Time    02/09/13  5:45 PM      Result Value Range   Color, Urine YELLOW  YELLOW   APPearance CLEAR  CLEAR   Specific Gravity, Urine >1.030 (*) 1.005 - 1.030   pH 6.0  5.0 - 8.0   Glucose, UA NEGATIVE  NEGATIVE mg/dL  Hgb urine dipstick SMALL (*) NEGATIVE   Bilirubin Urine NEGATIVE  NEGATIVE   Ketones, ur NEGATIVE  NEGATIVE mg/dL   Protein, ur NEGATIVE  NEGATIVE mg/dL   Urobilinogen, UA 0.2  0.0 - 1.0 mg/dL   Nitrite NEGATIVE  NEGATIVE   Leukocytes, UA NEGATIVE  NEGATIVE  URINE MICROSCOPIC-ADD ON     Status: Abnormal   Collection Time    02/09/13  5:45 PM      Result Value Range   Squamous Epithelial / LPF FEW (*) RARE   WBC, UA 0-2  <3 WBC/hpf   RBC / HPF 3-6  <3 RBC/hpf   Bacteria, UA FEW (*) RARE  WET PREP, GENITAL     Status: Abnormal   Collection Time    02/09/13  7:07 PM      Result Value Range   Yeast Wet Prep HPF POC NONE SEEN  NONE SEEN   Trich, Wet Prep NONE SEEN  NONE SEEN   Clue Cells Wet Prep HPF POC FEW (*) NONE SEEN   WBC, Wet Prep HPF POC MODERATE (*) NONE SEEN     Assessment and Plan   1. Bacterial vaginal infection   2. Muscle strain    RX: flagyl 500 BID Discussed probiotic use Flexeril BID #15  FU with Dr. Gaynell Face as needed   Tawnya Crook 02/09/2013, 6:59 PM

## 2013-02-09 NOTE — MAU Note (Signed)
Pt complains of back pain (rated 6 out of 10) for about a week and vaginal discharge/odor.

## 2013-02-10 LAB — GC/CHLAMYDIA PROBE AMP
CT Probe RNA: NEGATIVE
GC Probe RNA: NEGATIVE

## 2013-02-14 NOTE — MAU Provider Note (Signed)
Attestation of Attending Supervision of Advanced Practitioner (CNM/NP): Evaluation and management procedures were performed by the Advanced Practitioner under my supervision and collaboration.  I have reviewed the Advanced Practitioner's note and chart, and I agree with the management and plan.  Anita Allen 02/14/2013 9:54 AM   

## 2013-06-23 ENCOUNTER — Emergency Department (HOSPITAL_COMMUNITY)
Admission: EM | Admit: 2013-06-23 | Discharge: 2013-06-23 | Disposition: A | Discharge status: Home / Self Care | Payer: Medicaid Other | Attending: Emergency Medicine | Admitting: Emergency Medicine

## 2013-06-23 ENCOUNTER — Encounter (HOSPITAL_COMMUNITY): Payer: Self-pay | Admitting: Emergency Medicine

## 2013-06-23 DIAGNOSIS — R059 Cough, unspecified: Secondary | ICD-10-CM

## 2013-06-23 DIAGNOSIS — R05 Cough: Secondary | ICD-10-CM

## 2013-06-23 DIAGNOSIS — M542 Cervicalgia: Secondary | ICD-10-CM | POA: Insufficient documentation

## 2013-06-23 DIAGNOSIS — J029 Acute pharyngitis, unspecified: Secondary | ICD-10-CM | POA: Insufficient documentation

## 2013-06-23 DIAGNOSIS — Z79899 Other long term (current) drug therapy: Secondary | ICD-10-CM | POA: Insufficient documentation

## 2013-06-23 LAB — RAPID STREP SCREEN (MED CTR MEBANE ONLY): STREPTOCOCCUS, GROUP A SCREEN (DIRECT): NEGATIVE

## 2013-06-23 MED ORDER — BENZONATATE 100 MG PO CAPS: 100.0000 mg | ORAL_CAPSULE | Freq: Three times a day (TID) | ORAL | Status: DC

## 2013-06-23 MED ORDER — KETOROLAC TROMETHAMINE 30 MG/ML IJ SOLN
30.0000 mg | Freq: Once | INTRAMUSCULAR | Status: AC
  Administered 2013-06-23: 30 mg via INTRAMUSCULAR
  Filled 2013-06-23: qty 1

## 2013-06-23 MED ORDER — NAPROXEN 500 MG PO TABS: 500.0000 mg | ORAL_TABLET | Freq: Two times a day (BID) | ORAL | Status: DC

## 2013-06-23 MED ORDER — HYDROCOD POLST-CHLORPHEN POLST 10-8 MG/5ML PO LQCR: 5.0000 mL | Freq: Two times a day (BID) | ORAL | Status: DC | PRN

## 2013-06-23 NOTE — ED Provider Notes (Signed)
CSN: [id number]     Arrival date & time 06/23/13  1725 History  This chart was scribed for non-physician practitioner Rhea BleacherJosh Alika Saladin, PA-C working with Layla MawKristen N Ward, DO by Joaquin MusicKristina Sanchez-Matthews, ED Scribe. This patient was seen in room TR05C/TR05C and the patient's care was started at 7:04 PM .   Chief Complaint  Patient presents with  . Sore Throat  . Cough   The history is provided by the patient. No language interpreter was used.   HPI Comments: Anita Allen is a 23 y.o. female who presents to the Emergency Department complaining of ongoing productive cough and sore throat that began 2 weeks ago. Pt reports "her throat is swelling up". Pt states she is gradually worsening and states she is having neck pain. She reports losing her voice for the past 3 days. Pt states her cough is persistent at night and reports not being able to get adequate sleep due to cough. She states her cough "makes her want to vomit". Pt states she has been taking OTC Mucinex and Vitamin C without relief. Pt denies any recent sick contacts. Pt denies fever, nausea, and emesis.  Past Medical History  Diagnosis Date  . No pertinent past medical history   . Gonorrhea contact, treated    Past Surgical History  Procedure Laterality Date  . No past surgeries     Family History  Problem Relation Age of Onset  . Heart disease Father    History  Substance Use Topics  . Smoking status: Never Smoker   . Smokeless tobacco: Not on file  . Alcohol Use: No   OB History   Grav Para Term Preterm Abortions TAB SAB Ect Mult Living   3 2 2  0 1 1 0 0 0 2     Review of Systems  Constitutional: Negative for fever, chills and fatigue.  HENT: Positive for sore throat. Negative for congestion, ear pain, rhinorrhea and sinus pressure.   Eyes: Negative for redness.  Respiratory: Positive for cough. Negative for shortness of breath and wheezing.   Gastrointestinal: Negative for nausea, vomiting, abdominal pain and  diarrhea.  Genitourinary: Negative for dysuria.  Musculoskeletal: Negative for myalgias and neck stiffness.  Skin: Negative for rash.  Neurological: Negative for headaches.  Hematological: Negative for adenopathy.    Allergies  Amoxicillin-pot clavulanate and Latex  Home Medications   Current Outpatient Rx  Name  Route  Sig  Dispense  Refill  . cyclobenzaprine (FLEXERIL) 10 MG tablet   Oral   Take 1 tablet (10 mg total) by mouth 2 (two) times daily as needed for muscle spasms.   10 tablet   0   . metroNIDAZOLE (FLAGYL) 500 MG tablet   Oral   Take 1 tablet (500 mg total) by mouth 2 (two) times daily. X 7 days   14 tablet   0   . metroNIDAZOLE (FLAGYL) 500 MG tablet   Oral   Take 1 tablet (500 mg total) by mouth 2 (two) times daily.   14 tablet   0    BP 119/61  Pulse 94  Temp(Src) 98.6 F (37 C) (Oral)  Resp 16  SpO2 98%  Physical Exam  Nursing note and vitals reviewed. Constitutional: She appears well-developed and well-nourished. No distress.  HENT:  Head: Normocephalic and atraumatic.  Right Ear: Tympanic membrane, external ear and ear canal normal.  Left Ear: Tympanic membrane, external ear and ear canal normal.  Nose: Nose normal. No mucosal edema or rhinorrhea.  Mouth/Throat:  Uvula is midline, oropharynx is clear and moist and mucous membranes are normal. Mucous membranes are not dry. No oral lesions. No trismus in the jaw. No uvula swelling. No oropharyngeal exudate, posterior oropharyngeal edema, posterior oropharyngeal erythema or tonsillar abscesses.  Mild erythema of the pharynx. Mucosal edema bilaterally to nares.  Eyes: Conjunctivae and EOM are normal. Pupils are equal, round, and reactive to light. Right eye exhibits no discharge. Left eye exhibits no discharge.  Neck: Normal range of motion. Neck supple. No tracheal deviation present.  Cardiovascular: Normal rate, regular rhythm and normal heart sounds.  Exam reveals no gallop and no friction rub.    No murmur heard. Pulmonary/Chest: Effort normal and breath sounds normal. No respiratory distress. She has no wheezes. She has no rales.  Abdominal: Soft. There is no tenderness.  Musculoskeletal: Normal range of motion.  Lymphadenopathy:    She has no cervical adenopathy.  Neurological: She is alert.  Skin: Skin is warm and dry.  Psychiatric: She has a normal mood and affect. Her behavior is normal.    ED Course  Procedures  DIAGNOSTIC STUDIES: Oxygen Saturation is 98% on RA, normal by my interpretation.    COORDINATION OF CARE: 7:07 PM-Discussed treatment plan which includes rapid strep test and will give pt Toradol shot while in ED. Will discharge pt with anti-inflammatory and cough suppressant. Pt agreed to plan.   8:16 PM-Informed patient of lab results. Will discharge patient with Teslon Pearls and an anti-inflammatory. Pt agreed to plan.  Labs Review Labs Reviewed  RAPID STREP SCREEN  CULTURE, GROUP A STREP   Imaging Review No results found.  EKG Interpretation   None       Vital signs reviewed and are as follows: Filed Vitals:   06/23/13 2015  BP: 116/64  Pulse: 90  Temp: 98.8 F (37.1 C)  Resp: 17   Strep neg, pt informed. Will treat symptoms supportively.   Patient counseled on supportive care for viral URI and s/s to return including worsening symptoms, persistent fever, persistent vomiting, or if they have any other concerns.  Urged to see PCP if symptoms persist for more than 3 days. Patient verbalizes understanding and agrees with plan.    MDM   Final diagnoses:  Sore throat  Cough   Patient with symptoms consistent with a viral syndrome. Strep neg. Vitals are stable, no fever.  No signs of dehydration. Lung exam normal, no signs of pneumonia. Supportive therapy indicated with return if symptoms worsen.    I personally performed the services described in this documentation, which was scribed in my presence. The recorded information has been  reviewed and is accurate.    Renne Crigler, PA-C 06/23/13 2039

## 2013-06-23 NOTE — ED Notes (Signed)
Pt reports cough and sore throat x2 weeks, states "my throat is swelling." Pt talking on telephone upon arrival to ED, airway is intact, O2 sats 98% on RA

## 2013-06-23 NOTE — Discharge Instructions (Signed)
Please read and follow all provided instructions.  Your diagnoses today include:  1. Sore throat   2. Cough     Tests performed today include:  Strep test: was negative for strep throat  Vital signs. See below for your results today.   Medications prescribed:   Tessalon Perles - cough suppressant medication   Naproxen - anti-inflammatory pain medication  Do not exceed 500mg  naproxen every 12 hours, take with food  You have been prescribed an anti-inflammatory medication or NSAID. Take with food. Take smallest effective dose for the shortest duration needed for your pain. Stop taking if you experience stomach pain or vomiting.   Home care instructions:  Please read the educational materials provided and follow any instructions contained in this packet.  Follow-up instructions: Please follow-up with your primary care provider as needed for further evaluation of your symptoms.  If you do not have a primary care doctor -- see below for referral information.   Return instructions:   Please return to the Emergency Department if you experience worsening symptoms.   Return if you have worsening problems swallowing, your neck becomes swollen, you cannot swallow your saliva or your voice becomes muffled.   Return with high persistent fever, persistent vomiting, or if you have trouble breathing.   Please return if you have any other emergent concerns.  Additional Information:  Your vital signs today were: BP 119/61   Pulse 94   Temp(Src) 98.6 F (37 C) (Oral)   Resp 16   SpO2 98% If your blood pressure (BP) was elevated above 135/85 this visit, please have this repeated by your doctor within one month. --------------

## 2013-06-23 NOTE — ED Notes (Signed)
Unable to complete triage due to pt arguing with someone on the phone, pt will not answer triage questions, pt given a mask in triage, she put it in her diaper bag and did not acknowledge triage RN's instructions to apply mask

## 2013-06-24 NOTE — ED Provider Notes (Signed)
Medical screening examination/treatment/procedure(s) were performed by non-physician practitioner and as supervising physician I was immediately available for consultation/collaboration.  EKG Interpretation   None         Layla MawKristen N Averi Cacioppo, DO 06/24/13 [phone]

## 2013-06-25 LAB — CULTURE, GROUP A STREP

## 2013-09-07 ENCOUNTER — Encounter (HOSPITAL_COMMUNITY): Payer: Self-pay | Admitting: Emergency Medicine

## 2013-09-07 ENCOUNTER — Emergency Department (HOSPITAL_COMMUNITY)
Admission: EM | Admit: 2013-09-07 | Discharge: 2013-09-07 | Disposition: A | Discharge status: Home / Self Care | Payer: Medicaid Other | Attending: Emergency Medicine | Admitting: Emergency Medicine

## 2013-09-07 DIAGNOSIS — Z880 Allergy status to penicillin: Secondary | ICD-10-CM | POA: Insufficient documentation

## 2013-09-07 DIAGNOSIS — N760 Acute vaginitis: Secondary | ICD-10-CM | POA: Insufficient documentation

## 2013-09-07 DIAGNOSIS — R21 Rash and other nonspecific skin eruption: Secondary | ICD-10-CM | POA: Insufficient documentation

## 2013-09-07 DIAGNOSIS — Z8619 Personal history of other infectious and parasitic diseases: Secondary | ICD-10-CM | POA: Insufficient documentation

## 2013-09-07 DIAGNOSIS — Z91040 Latex allergy status: Secondary | ICD-10-CM | POA: Insufficient documentation

## 2013-09-07 DIAGNOSIS — Z3202 Encounter for pregnancy test, result negative: Secondary | ICD-10-CM | POA: Insufficient documentation

## 2013-09-07 DIAGNOSIS — A499 Bacterial infection, unspecified: Secondary | ICD-10-CM | POA: Insufficient documentation

## 2013-09-07 DIAGNOSIS — B9689 Other specified bacterial agents as the cause of diseases classified elsewhere: Secondary | ICD-10-CM | POA: Insufficient documentation

## 2013-09-07 LAB — WET PREP, GENITAL
Trich, Wet Prep: NONE SEEN
Yeast Wet Prep HPF POC: NONE SEEN

## 2013-09-07 LAB — URINALYSIS, ROUTINE W REFLEX MICROSCOPIC
BILIRUBIN URINE: NEGATIVE
Glucose, UA: NEGATIVE mg/dL
KETONES UR: NEGATIVE mg/dL
NITRITE: NEGATIVE
Protein, ur: NEGATIVE mg/dL
Specific Gravity, Urine: 1.023 (ref 1.005–1.030)
UROBILINOGEN UA: 1 mg/dL (ref 0.0–1.0)
pH: 6.5 (ref 5.0–8.0)

## 2013-09-07 LAB — PREGNANCY, URINE: Preg Test, Ur: NEGATIVE

## 2013-09-07 LAB — URINE MICROSCOPIC-ADD ON

## 2013-09-07 MED ORDER — METRONIDAZOLE 500 MG PO TABS
500.0000 mg | ORAL_TABLET | Freq: Once | ORAL | Status: AC
  Administered 2013-09-07: 500 mg via ORAL
  Filled 2013-09-07: qty 1

## 2013-09-07 MED ORDER — METRONIDAZOLE 500 MG PO TABS: 500.0000 mg | ORAL_TABLET | Freq: Two times a day (BID) | ORAL | Status: DC

## 2013-09-07 NOTE — ED Provider Notes (Signed)
CSN: [id number]     Arrival date & time 09/07/13  1929 History   None    Chief Complaint  Patient presents with  . Insect Bite  . Vaginitis   HPI  Anita Allen is a 23 y.o. female with a PMH of gonorrhea who presents to the ED for evaluation of vaginitis and insect bite. History was provided by the patient.   Patient states that she has had a scattered rash over arms and legs for the past 2 weeks. She states that she has noticed welts "popping up" on her arms and legs, which resolve but re-occur in different areas. Has 2-3 on her arms and 1-2 in her legs currently. No diffuse rash throughout. Has localized itching to the area. Has not applied anything to the rash. No other close contacts with similar rash. Her son sleeps in bed with her and does not have a rash. No new soaps, lotions, or detergents. No similar rash in the past. Denies perfuse diffuse itching. No hx of dermatological conditions.   Patient also complains of a thick white "yeast" like discharge for the past 2 weeks. Has had vaginal itching and irritation. No genital sores, vaginal pain, or vaginal bleeding. Patient has not been sexually active for the past 10 months. Has tried eating yogurt and applying yogurt to the vaginal area with no relief in her symptoms. No abdominal pain, nausea, emesis, diarrhea, constipation, dysuria, or hematuria.    Past Medical History  Diagnosis Date  . No pertinent past medical history   . Gonorrhea contact, treated    Past Surgical History  Procedure Laterality Date  . No past surgeries     Family History  Problem Relation Age of Onset  . Heart disease Father    History  Substance Use Topics  . Smoking status: Never Smoker   . Smokeless tobacco: Not on file  . Alcohol Use: No   OB History   Grav Para Term Preterm Abortions TAB SAB Ect Mult Living   3 2 2  0 1 1 0 0 0 2      Review of Systems  Constitutional: Negative for fever, chills, diaphoresis, activity change, appetite  change and fatigue.  HENT: Negative for mouth sores.   Respiratory: Negative for shortness of breath and wheezing.   Gastrointestinal: Negative for nausea, vomiting, abdominal pain, diarrhea and constipation.  Genitourinary: Positive for vaginal discharge. Negative for dysuria, hematuria, decreased urine volume, vaginal bleeding, difficulty urinating, genital sores, vaginal pain and pelvic pain.  Musculoskeletal: Negative for back pain, myalgias and neck pain.  Skin: Positive for rash.  Neurological: Negative for dizziness, weakness, light-headedness and headaches.    Allergies  Amoxicillin-pot clavulanate and Latex  Home Medications   Prior to Admission medications   Not on File   BP 112/88  Pulse 85  Temp(Src) 98.1 F (36.7 C) (Oral)  Resp 16  SpO2 100%  LMP 08/21/2013  Breastfeeding? No  Filed Vitals:   09/07/13 2047 09/07/13 2100 09/07/13 2115 09/07/13 2306  BP: 112/88 122/93 104/78 123/61  Pulse: 85 95 81 81  Temp: 98.1 F (36.7 C)   98.2 F (36.8 C)  TempSrc: Oral   Oral  Resp: 16   18  SpO2: 100% 98% 98% 99%    Physical Exam  Nursing note and vitals reviewed. Constitutional: She is oriented to person, place, and time. She appears well-developed and well-nourished. No distress.  HENT:  Head: Normocephalic and atraumatic.  Right Ear: External ear normal.  Left  Ear: External ear normal.  Mouth/Throat: Oropharynx is clear and moist.  Eyes: Conjunctivae are normal. Right eye exhibits no discharge. Left eye exhibits no discharge.  Neck: Normal range of motion.  Cardiovascular: Normal rate, regular rhythm and normal heart sounds.  Exam reveals no gallop and no friction rub.   No murmur heard. Pulmonary/Chest: Effort normal and breath sounds normal. No respiratory distress. She has no wheezes. She has no rales. She exhibits no tenderness.  Abdominal: Soft. Bowel sounds are normal. She exhibits no distension and no mass. There is no tenderness. There is no rebound  and no guarding.  Musculoskeletal: Normal range of motion. She exhibits no edema and no tenderness.  No CVA, lumbar, or flank tenderness bilaterally.   Neurological: She is alert and oriented to person, place, and time.  Skin: Skin is warm and dry. She is not diaphoretic.  2-3 circular <1 cm raised erythematous wheals to the arms bilaterally with no open wounds, induration, of fluctuance. No wheals, rash, or lesions seen on the LE bilaterally, chest, back, or scalp.      ED Course  Procedures (including critical care time) Labs Review Labs Reviewed  URINALYSIS, ROUTINE W REFLEX MICROSCOPIC  PREGNANCY, URINE    Imaging Review No results found.   EKG Interpretation None      Results for orders placed during the hospital encounter of 09/07/13  WET PREP, GENITAL      Result Value Ref Range   Yeast Wet Prep HPF POC NONE SEEN  NONE SEEN   Trich, Wet Prep NONE SEEN  NONE SEEN   Clue Cells Wet Prep HPF POC MANY (*) NONE SEEN   WBC, Wet Prep HPF POC TOO NUMEROUS TO COUNT (*) NONE SEEN  URINALYSIS, ROUTINE W REFLEX MICROSCOPIC      Result Value Ref Range   Color, Urine YELLOW  YELLOW   APPearance CLOUDY (*) CLEAR   Specific Gravity, Urine 1.023  1.005 - 1.030   pH 6.5  5.0 - 8.0   Glucose, UA NEGATIVE  NEGATIVE mg/dL   Hgb urine dipstick TRACE (*) NEGATIVE   Bilirubin Urine NEGATIVE  NEGATIVE   Ketones, ur NEGATIVE  NEGATIVE mg/dL   Protein, ur NEGATIVE  NEGATIVE mg/dL   Urobilinogen, UA 1.0  0.0 - 1.0 mg/dL   Nitrite NEGATIVE  NEGATIVE   Leukocytes, UA MODERATE (*) NEGATIVE  PREGNANCY, URINE      Result Value Ref Range   Preg Test, Ur NEGATIVE  NEGATIVE  URINE MICROSCOPIC-ADD ON      Result Value Ref Range   Squamous Epithelial / LPF MANY (*) RARE   WBC, UA 0-2  <3 WBC/hpf   RBC / HPF 0-2  <3 RBC/hpf   Bacteria, UA FEW (*) RARE     MDM   Anita Allen is a 23 y.o. female with a PMH of gonorrhea who presents to the ED for evaluation of vaginitis and insect  bite.  Rechecks  10:00 PM = Pelvic exam performed at bedside. Minimal amount of thin white discharge present in the vaginal vault. No CMT or adnexal tenderness bilaterally. No vaginal bleeding.     Etiology of rash likely due to insect bites. Possibly bed bugs, however, this is less likely as her son does not have a rash. No evidence of an infectious process, cellulitis, or abscess. Rash is benign. Instructed patient to apply anti-itch or hydrocortisone cream for symptomatic relief. Etiology of vaginal discharge likely due to BV. Pelvic exam benign. PID unlikely. Abdominal  exam benign. Patient afebrile and non-toxic in appearance. Possible UTI. Patient has no dysuria. Will sent urine for culture. Will not treat at this time. Vital signs stable. Instructed patient to follow-up with PCP. Return precautions, discharge instructions, and follow-up was discussed with the patient before discharge.      Discharge Medication List as of 09/07/2013 10:42 PM    START taking these medications   Details  metroNIDAZOLE (FLAGYL) 500 MG tablet Take 1 tablet (500 mg total) by mouth 2 (two) times daily., Starting 09/07/2013, Until Discontinued, Print         Final impressions: 1. Bacterial vaginosis   2. Rash       Greer Ee Gusta Marksberry PA-C      Jillyn Ledger, [place] 09/08/13 [phone]

## 2013-09-07 NOTE — Discharge Instructions (Signed)
Apply hydrocortisone cream or anti-itch cream to affected areas Take Benadryl as needed for itching Keep skin clean and dry Take flagyl for BV - do not drink alcohol with this medication  Avoid over washing in the vaginal area to help prevent BV from re-occuring  Return to the emergency department if you develop any changing/worsening condition, fever, abdominal pain, spreading redness/swellling, or any other concerns (please read additional information regarding your condition below)    Bacterial Vaginosis Bacterial vaginosis is a vaginal infection that occurs when the normal balance of bacteria in the vagina is disrupted. It results from an overgrowth of certain bacteria. This is the most common vaginal infection in women of childbearing age. Treatment is important to prevent complications, especially in pregnant women, as it can cause a premature delivery. CAUSES  Bacterial vaginosis is caused by an increase in harmful bacteria that are normally present in smaller amounts in the vagina. Several different kinds of bacteria can cause bacterial vaginosis. However, the reason that the condition develops is not fully understood. RISK FACTORS Certain activities or behaviors can put you at an increased risk of developing bacterial vaginosis, including:  Having a new sex partner or multiple sex partners.  Douching.  Using an intrauterine device (IUD) for contraception. Women do not get bacterial vaginosis from toilet seats, bedding, swimming pools, or contact with objects around them. SIGNS AND SYMPTOMS  Some women with bacterial vaginosis have no signs or symptoms. Common symptoms include:  Grey vaginal discharge.  A fishlike odor with discharge, especially after sexual intercourse.  Itching or burning of the vagina and vulva.  Burning or pain with urination. DIAGNOSIS  Your health care provider will take a medical history and examine the vagina for signs of bacterial vaginosis. A sample  of vaginal fluid may be taken. Your health care provider will look at this sample under a microscope to check for bacteria and abnormal cells. A vaginal pH test may also be done.  TREATMENT  Bacterial vaginosis may be treated with antibiotic medicines. These may be given in the form of a pill or a vaginal cream. A second round of antibiotics may be prescribed if the condition comes back after treatment.  HOME CARE INSTRUCTIONS   Only take over-the-counter or prescription medicines as directed by your health care provider.  If antibiotic medicine was prescribed, take it as directed. Make sure you finish it even if you start to feel better.  Do not have sex until treatment is completed.  Tell all sexual partners that you have a vaginal infection. They should see their health care provider and be treated if they have problems, such as a mild rash or itching.  Practice safe sex by using condoms and only having one sex partner. SEEK MEDICAL CARE IF:   Your symptoms are not improving after 3 days of treatment.  You have increased discharge or pain.  You have a fever. MAKE SURE YOU:   Understand these instructions.  Will watch your condition.  Will get help right away if you are not doing well or get worse. FOR MORE INFORMATION  Centers for Disease Control and Prevention, Division of STD Prevention: SolutionApps.co.za American Sexual Health Association (ASHA): www.ashastd.org  Document Released: 04/27/2005 Document Revised: 02/15/2013 Document Reviewed: 12/07/2012 [facility] Patient Information 2014 [place], [place].  Insect Bite Mosquitoes, flies, fleas, bedbugs, and many other insects can bite. Insect bites are different from insect stings. A sting is when venom is injected into the skin. Some insect bites can transmit infectious  diseases. SYMPTOMS  Insect bites usually turn red, swell, and itch for 2 to 4 days. They often go away on their own. TREATMENT  Your caregiver may prescribe  antibiotic medicines if a bacterial infection develops in the bite. HOME CARE INSTRUCTIONS Do not scratch the bite area. Keep the bite area clean and dry. Wash the bite area thoroughly with soap and water. Put ice or cool compresses on the bite area. Put ice in a plastic bag. Place a towel between your skin and the bag. Leave the ice on for 20 minutes, 4 times a day for the first 2 to 3 days, or as directed. You may apply a baking soda paste, cortisone cream, or calamine lotion to the bite area as directed by your caregiver. This can help reduce itching and swelling. Only take over-the-counter or prescription medicines as directed by your caregiver. If you are given antibiotics, take them as directed. Finish them even if you start to feel better. You may need a tetanus shot if: You cannot remember when you had your last tetanus shot. You have never had a tetanus shot. The injury broke your skin. If you get a tetanus shot, your arm may swell, get red, and feel warm to the touch. This is common and not a problem. If you need a tetanus shot and you choose not to have one, there is a rare chance of getting tetanus. Sickness from tetanus can be serious. SEEK IMMEDIATE MEDICAL CARE IF:  You have increased pain, redness, or swelling in the bite area. You see a red line on the skin coming from the bite. You have a fever. You have joint pain. You have a headache or neck pain. You have unusual weakness. You have a rash. You have chest pain or shortness of breath. You have abdominal pain, nausea, or vomiting. You feel unusually tired or sleepy. MAKE SURE YOU:  Understand these instructions. Will watch your condition. Will get help right away if you are not doing well or get worse. Document Released: 06/04/2004 Document Revised: 07/20/2011 Document Reviewed: 11/26/2010 [facility] Patient Information 2014 [place], [place].  Rash A rash is a change in the color or texture of your skin. There are  many different types of rashes. You may have other problems that accompany your rash. CAUSES   Infections.  Allergic reactions. This can include allergies to pets or foods.  Certain medicines.  Exposure to certain chemicals, soaps, or cosmetics.  Heat.  Exposure to poisonous plants.  Tumors, both cancerous and noncancerous. SYMPTOMS   Redness.  Scaly skin.  Itchy skin.  Dry or cracked skin.  Bumps.  Blisters.  Pain. DIAGNOSIS  Your caregiver may do a physical exam to determine what type of rash you have. A skin sample (biopsy) may be taken and examined under a microscope. TREATMENT  Treatment depends on the type of rash you have. Your caregiver may prescribe certain medicines. For serious conditions, you may need to see a skin doctor (dermatologist). HOME CARE INSTRUCTIONS   Avoid the substance that caused your rash.  Do not scratch your rash. This can cause infection.  You may take cool baths to help stop itching.  Only take over-the-counter or prescription medicines as directed by your caregiver.  Keep all follow-up appointments as directed by your caregiver. SEEK IMMEDIATE MEDICAL CARE IF:  You have increasing pain, swelling, or redness.  You have a fever.  You have new or severe symptoms.  You have body aches, diarrhea, or vomiting.  Your  rash is not better after 3 days. MAKE SURE YOU:  Understand these instructions.  Will watch your condition.  Will get help right away if you are not doing well or get worse. Document Released: 04/17/2002 Document Revised: 07/20/2011 Document Reviewed: 02/09/2011 [facility] Patient Information 2014 [place], [place].   Emergency Department Resource Guide 1) Find a Doctor and Pay Out of Pocket Although you won't have to find out who is covered by your insurance plan, it is a good idea to ask around and get recommendations. You will then need to call the office and see if the doctor you have chosen will accept you  as a new patient and what types of options they offer for patients who are self-pay. Some doctors offer discounts or will set up payment plans for their patients who do not have insurance, but you will need to ask so you aren't surprised when you get to your appointment.  2) Contact Your Local Health Department Not all health departments have doctors that can see patients for sick visits, but many do, so it is worth a call to see if yours does. If you don't know where your local health department is, you can check in your phone book. The CDC also has a tool to help you locate your state's health department, and many state websites also have listings of all of their local health departments.  3) Find a Walk-in Clinic If your illness is not likely to be very severe or complicated, you may want to try a walk in clinic. These are popping up all over the country in pharmacies, drugstores, and shopping centers. They're usually staffed by nurse practitioners or physician assistants that have been trained to treat common illnesses and complaints. They're usually fairly quick and inexpensive. However, if you have serious medical issues or chronic medical problems, these are probably not your best option.  No Primary Care Doctor: - Call Health Connect at  [phone] - they can help you locate a primary care doctor that  accepts your insurance, provides certain services, etc. - Physician Referral Service- [phone]  Chronic Pain Problems: Organization         Address  Phone   Notes  [facility]  [phone]) [phone] Patients need to be referred by their primary care doctor.   Medication Assistance: Organization         Address  Phone   Notes  [facility] Passistance Program [address] [place], [place] [address] [phone]) [phone] --Must be a resident of [place] -- Must have NO insurance coverage whatsoever (no Medicaid/ Medicare, etc.) -- The pt. MUST have  a primary care doctor that directs their care regularly and follows them in the community   [facility]ist  [phone]   [employer]  [phone]) [phone]    Agencies that provide inexpensive medical care: Organization         Address  Phone   Notes  [facility]  [phone]   [facility]    [phone]   [facility] [address] [place], [place] [address] [phone]) [phone]   [facility] [address], [place] [phone]   [facility]anned [facility]    [phone](336) [phone]   [facility]    [phone]) [phone]   [facility]  [address], [place] Phone:  [phone], Fax:  [phone] Hours of Operation:  9  am - 6 pm, M-F.  Also accepts Medicaid/Medicare and self-pay.  [facility]  [address], [place] Phone: [phone](336) [phone], Fax: [phone]) [phone]. Hours of Operation:  8:30 am - 5:30 pm, M-F.  Also accepts Medicaid and self-pay.  [facility] [place] [address], [place] Phone: [phone]) [phone]   [facility] [address], [place], [place] [phone], Ext. [phone] Mondays & Thursdays: 7-9 AM.  First 15 patients are seen on a first come, first serve basis.    Medicaid-accepting [facility] Providers:  Organization         Address  Phone   Notes  [facility] [address], [place] [phone] Also accepts self-pay patients.  [facility] [address] [place], [place]  [phone]) [phone]   [facility] [address], [place] [phone]) [phone]   [facility] [address] [place] [address], [place] [phone]) [phone]   Renaye RakersVeita Bland [address], [place]   [phone]) [phone] Only accepts [place] Access [place] patients after they have their name applied to their card.   Self-Pay (no insurance) in [facility]  [place]:  Organization         Address  Phone   Notes  Sickle Cell Patients, [facility] [address], [place] [phone]) [phone]   [facility] [address], [place] [phone]   [facility] [place]  [address], [place] [phone]) [phone]   [facility]/Dr. Osei-Bonsu  [address] [place] [address], [place] or [phone], [place] [phone] Phone number for both [place] and [place] locations is the same.  [facility] [address], [place] [phone]) [phone]   [facility] [place] [address] [place] [address], [place] or [address] [phone]   [facility] [address], [place] [phone]) [phone], phone; [phone]) [phone], fax Sees patients 1st and 3rd Saturday of every month.  Must not qualify for public or private insurance (i.e. Medicaid, Medicare, [place] Health Choice, Veterans' Benefits)  Household income should be no more than 200% of the poverty level The clinic cannot treat you if you are pregnant or think you are pregnant  Sexually transmitted diseases are not treated at the clinic.    Dental Care: Organization         Address  Phone  Notes  [facility] Department of [facility] [address], [place] [phone] Accepts children up to age 23 who are enrolled in [place] or [place] Health Choice; pregnant women with a Medicaid card; and children who have applied for Medicaid or [place] Health Choice, but were declined, whose parents can pay a reduced fee at time of service.  [facility] of [facility] [place]  [address], [place] [phone] Accepts children up to age 23 who are enrolled in [place] or [place] Health Choice; pregnant women with a Medicaid card; and children who have applied for Medicaid or [place] Health Choice, but were declined, whose parents can  pay a reduced fee at time of service.  [facility]  [address], [place] [phone] Patients are seen by appointment only. Walk-ins are not accepted. [facility] will see patients 18 years of  age and older. Monday - Tuesday (8am-5pm) Most Wednesdays (8:30-5pm) $30 per visit, cash only  [facility]  [address], [facility] [phone] Patients are seen by appointment only. Walk-ins are not accepted. [facility] will see patients 38 years of age and older. One Wednesday Evening (Monthly: Volunteer Based).  $30 per visit, cash only  [employer]  [phone] for adults; Children under age 66, call Graduate Pediatric Dentistry at [phone]. Children aged 62-14, please call [phone] to request a pediatric application.  Dental services are provided in all areas of dental care including fillings, crowns and bridges, complete and partial dentures, implants, gum treatment, root canals, and extractions. Preventive care is also provided. Treatment is provided to both adults and children. Patients are selected via a lottery and there is often a waiting list.   [facility] [address] [place]  [phone] [email or web address]   [facility] [address] [place], [place] [phone], Ext. [phone] Second and Fourth Thursday of each month, opens at 6:30 AM; Clinic ends at 9 AM.  Patients are seen on a first-come first-served basis, and a limited number are seen during each clinic.   [facility]  [address] [place], [place] [phone]   Eligibility Requirements You must have lived in [place], [place], or [place] counties for at least the last three months.   You cannot be eligible for state or federal sponsored [place], including [employer], [place], or [employer].   You generally cannot be eligible for healthcare  insurance through your employer.    How to apply: Eligibility screenings are held every Tuesday and Wednesday afternoon from 1:00 pm until 4:00 pm. You do not need an appointment for the interview!  [facility] [address] [place], [place] [phone]   [facility] Department  [phone]   [facility] Health Department  [phone]   [facility]  [phone]    Behavioral Health Resources in the Community: Intensive Outpatient Programs Organization         Address  Phone  Notes  [facility] [address] [place], [place] [phone]   [facility] [address] [facility] Outpatient [address], [place], [place] [phone]   [facility] [address], [place], [place]  [phone]   [facility] [address],  [place], [place] [phone] or [phone]   Substance Abuse Resources Organization         Address  Phone  Notes  [facility] Drug Services  [phone]   [facility]  [phone]   [facility] [place]  [phone]   Floydene Flock  [phone]   Residential & Outpatient Substance Abuse Program  [phone]   Psychological Services Organization         Address  Phone  Notes  [facility]al Health  [phone]   [facility]  [phone]   [facility] [address], [place] [phone] or [phone]    Mobile Crisis Teams Organization         Address  Phone  Notes  [facility], Mobile Crisis Care Unit  [phone]   [facility]  [address] [place], [place] [phone]   [facility] [address] [place] [phone]    Self-Help/Support Groups Organization         Address  Phone             Notes  [facility] Assoc[facility] of [place] - variety of support groups  336- I7437963 Call for more information  Narcotics Anonymous (NA),  [facility] [address], [employer] [place]  2 meetings at this location   Statistician         Address  Phone  Notes  [facility] [address],    [place]  [phone]   [facility]  [address], [place] [address], [place], [place] [phone]   [facility] [address] [place], [place] [phone] Admissions: 8am-3pm M-F  [facility] [address],    [place], [place] [phone]   [facility] [address] [place], [place], [place] [phone]   [facility] [address],  [place], [place] [phone]   [facility] - Intensive Outpatient [address], [place], [place] [phone]   [facility] ([facility] [address]  [place], [place] [phone] or [phone]   [facility] ([facility]) [address] [place] [address] [place], [place] [phone] Accepts Medicaid  [facility] [address],  [place] [phone] Substance Abuse/Addiction Treatment   [facility] Organization         Address  Phone  Notes  [facility]  [phone]   Angie Fava, PhD [address]vin Knack [place], [place]   [phone] or [phone]   [facility] Behavioral   [address] [place], [place] [phone]   [facility] [address] [place], [place] [phone] Insurance/Medicaid/sponsorship through [facility] and Families [address]                                    [place], [place] [phone] Therapy/tele-psych/case  [facility] [address] [place], [place] [phone]    Dr. Lolly Mustache  [phone]   [facility]  [employer] [facility] Dept. 1) [address] [place] 2) [address], [place] 3)  [address], [place] [phone]  [phone]    [facility] Child Abuse Hotline 507-730-6156 or [phone] (After Hours)      Discharge paper given to pt.

## 2013-09-07 NOTE — ED Notes (Signed)
Pt presents to ED with c/o rash (bumps) and vaginal discharge/itching, onset about 2 weeks. Pt thinks rash might be from bed bugs and vaginal discharge to be yeast infection.

## 2013-09-08 LAB — URINE CULTURE

## 2013-09-08 LAB — GC/CHLAMYDIA PROBE AMP
CT Probe RNA: NEGATIVE
GC PROBE AMP APTIMA: NEGATIVE

## 2013-09-16 NOTE — ED Provider Notes (Signed)
Medical screening examination/treatment/procedure(s) were performed by non-physician practitioner and as supervising physician I was immediately available for consultation/collaboration.   EKG Interpretation None        Rolland PorterMark Treshon Stannard, MD 09/16/13 [phone]

## 2013-10-24 ENCOUNTER — Inpatient Hospital Stay (HOSPITAL_COMMUNITY)
Admission: AD | Admit: 2013-10-24 | Discharge: 2013-10-24 | Disposition: A | Discharge status: Home / Self Care | Payer: Medicaid Other | Source: Ambulatory Visit | Attending: Obstetrics & Gynecology | Admitting: Obstetrics & Gynecology

## 2013-10-24 ENCOUNTER — Inpatient Hospital Stay (HOSPITAL_COMMUNITY): Payer: Medicaid Other

## 2013-10-24 ENCOUNTER — Encounter (HOSPITAL_COMMUNITY): Payer: Self-pay | Admitting: *Deleted

## 2013-10-24 DIAGNOSIS — O210 Mild hyperemesis gravidarum: Secondary | ICD-10-CM | POA: Insufficient documentation

## 2013-10-24 DIAGNOSIS — O9989 Other specified diseases and conditions complicating pregnancy, childbirth and the puerperium: Secondary | ICD-10-CM

## 2013-10-24 DIAGNOSIS — M545 Low back pain, unspecified: Secondary | ICD-10-CM | POA: Insufficient documentation

## 2013-10-24 DIAGNOSIS — O26899 Other specified pregnancy related conditions, unspecified trimester: Secondary | ICD-10-CM

## 2013-10-24 DIAGNOSIS — O99891 Other specified diseases and conditions complicating pregnancy: Secondary | ICD-10-CM | POA: Insufficient documentation

## 2013-10-24 DIAGNOSIS — R109 Unspecified abdominal pain: Secondary | ICD-10-CM | POA: Insufficient documentation

## 2013-10-24 DIAGNOSIS — M7918 Myalgia, other site: Secondary | ICD-10-CM

## 2013-10-24 DIAGNOSIS — IMO0001 Reserved for inherently not codable concepts without codable children: Secondary | ICD-10-CM | POA: Insufficient documentation

## 2013-10-24 LAB — CBC
HEMATOCRIT: 36.6 % (ref 36.0–46.0)
Hemoglobin: 12.4 g/dL (ref 12.0–15.0)
MCH: 28.4 pg (ref 26.0–34.0)
MCHC: 33.9 g/dL (ref 30.0–36.0)
MCV: 83.8 fL (ref 78.0–100.0)
Platelets: 298 10*3/uL (ref 150–400)
RBC: 4.37 MIL/uL (ref 3.87–5.11)
RDW: 14.1 % (ref 11.5–15.5)
WBC: 10.9 10*3/uL — ABNORMAL HIGH (ref 4.0–10.5)

## 2013-10-24 LAB — POCT PREGNANCY, URINE: Preg Test, Ur: POSITIVE — AB

## 2013-10-24 LAB — URINE MICROSCOPIC-ADD ON

## 2013-10-24 LAB — URINALYSIS, ROUTINE W REFLEX MICROSCOPIC
Bilirubin Urine: NEGATIVE
Glucose, UA: NEGATIVE mg/dL
KETONES UR: NEGATIVE mg/dL
LEUKOCYTES UA: NEGATIVE
NITRITE: NEGATIVE
PH: 5.5 (ref 5.0–8.0)
Protein, ur: NEGATIVE mg/dL
Urobilinogen, UA: 0.2 mg/dL (ref 0.0–1.0)

## 2013-10-24 LAB — WET PREP, GENITAL
Clue Cells Wet Prep HPF POC: NONE SEEN
Trich, Wet Prep: NONE SEEN
Yeast Wet Prep HPF POC: NONE SEEN

## 2013-10-24 LAB — HCG, QUANTITATIVE, PREGNANCY: hCG, Beta Chain, Quant, S: 6443 m[IU]/mL — ABNORMAL HIGH (ref <5)

## 2013-10-24 MED ORDER — PROMETHAZINE HCL 25 MG PO TABS: 25.0000 mg | ORAL_TABLET | Freq: Four times a day (QID) | ORAL | Status: DC | PRN

## 2013-10-24 MED ORDER — CONCEPT OB 130-92.4-1 MG PO CAPS: 1.0000 | ORAL_CAPSULE | Freq: Every day | ORAL | Status: DC

## 2013-10-24 NOTE — Discharge Instructions (Signed)
Abdominal Pain During Pregnancy °Abdominal pain is common in pregnancy. Most of the time, it does not cause harm. There are many causes of abdominal pain. Some causes are more serious than others. Some of the causes of abdominal pain in pregnancy are easily diagnosed. Occasionally, the diagnosis takes time to understand. Other times, the cause is not determined. Abdominal pain can be a sign that something is very wrong with the pregnancy, or the pain may have nothing to do with the pregnancy at all. For this reason, always tell your health care provider if you have any abdominal discomfort. °HOME CARE INSTRUCTIONS  °Monitor your abdominal pain for any changes. The following actions may help to alleviate any discomfort you are experiencing: °· Do not have sexual intercourse or put anything in your vagina until your symptoms go away completely. °· Get plenty of rest until your pain improves. °· Drink clear fluids if you feel nauseous. Avoid solid food as long as you are uncomfortable or nauseous. °· Only take over-the-counter or prescription medicine as directed by your health care provider. °· Keep all follow-up appointments with your health care provider. °SEEK IMMEDIATE MEDICAL CARE IF: °· You are bleeding, leaking fluid, or passing tissue from the vagina. °· You have increasing pain or cramping. °· You have persistent vomiting. °· You have painful or bloody urination. °· You have a fever. °· You notice a decrease in your baby's movements. °· You have extreme weakness or feel faint. °· You have shortness of breath, with or without abdominal pain. °· You develop a severe headache with abdominal pain. °· You have abnormal vaginal discharge with abdominal pain. °· You have persistent diarrhea. °· You have abdominal pain that continues even after rest, or gets worse. °MAKE SURE YOU:  °· Understand these instructions. °· Will watch your condition. °· Will get help right away if you are not doing well or get  worse. °Document Released: 04/27/2005 Document Revised: 02/15/2013 Document Reviewed: 11/24/2012 °ExitCare® Patient Information ©2014 [employer]. ° °

## 2013-10-24 NOTE — MAU Provider Note (Signed)
Chief Complaint: Abdominal Pain, Nausea and Back Pain   First Provider Initiated Contact with Patient 10/24/13 2119     SUBJECTIVE HPI: Anita Allen is a 23 y.o. [id number] at 7918w6d by LMP who presents with low abd pain and LBP x 2 weeks and nausea x 3 days. Patient's last menstrual period was 09/20/2013. 30-day cycles usually. Has not taken UPT.   Abd pain is across entire low abd, 7/10 at worst, intermittent, crampy. Has not tried anything for the pain. There are no aggravating or aleviating factors. Low back pain is present across the entire low back, sometimes radiating up the spine w/ position changes, worse after work (sits 8 hours a day) . Describes pain as constant, dull, positional. 7/10 at worst. Has not tried anything for it.   Past Medical History  Diagnosis Date  . No pertinent past medical history   . Gonorrhea contact, treated    OB History  Gravida Para Term Preterm AB SAB TAB Ectopic Multiple Living  4 2 2  0 1 0 1 0 0 2    # Outcome Date GA Lbr Len/2nd Weight Sex Delivery Anes PTL Lv  4 CUR           3 TRM 02/24/12 4911w1d / 01:03  M SVD EPI  Y  2 TRM 2012 1216w0d   F SVD   Y  1 TAB 2010 6165w0d            Past Surgical History  Procedure Laterality Date  . No past surgeries     History   Social History  . Marital Status: Single    Spouse Name: N/A    Number of Children: N/A  . Years of Education: N/A   Occupational History  . Not on file.   Social History Main Topics  . Smoking status: Never Smoker   . Smokeless tobacco: Never Used  . Alcohol Use: No  . Drug Use: No  . Sexual Activity: Yes    Birth Control/ Protection: Condom   Other Topics Concern  . Not on file   Social History Narrative  . No narrative on file   No current facility-administered medications on file prior to encounter.   No current outpatient prescriptions on file prior to encounter.   Allergies  Allergen Reactions  . Amoxicillin-Pot Clavulanate Hives  . Latex Rash    Vaginal  irritation    ROS: Pertinent positive items in HPI. Also reports increased vaginal discharge without odor x1 week. Negative for fever, chills, vomiting, constipation, diarrhea, dysuria, hematuria, frequency, urgency, flank pain, vaginal bleeding or sick contacts.  OBJECTIVE Blood pressure 113/64, pulse 96, temperature 98.3 F (36.8 C), temperature source Oral, resp. rate 16, height 5\' 2"  (1.575 m), weight 70.308 kg (155 lb), last menstrual period 09/20/2013, not currently breastfeeding. GENERAL: Well-developed, well-nourished female in no acute distress.  HEENT: Normocephalic HEART: normal rate RESP: normal effort ABDOMEN: Soft, mild bilateral low abdominal tenderness. Positive bowel sounds x4.  BACK: Mild tenderness across low back. Paraspinal muscles nontender. No CVA tenderness. EXTREMITIES: Nontender, no edema NEURO: Alert and oriented SPECULUM EXAM: NEFG, small amount of mucoid, odorless discharge, no blood noted, cervix friable. 1.5 cm ectropion. BIMANUAL: cervix closed; uterus normal size. Mild right adnexal tenderness. No mass. No left adnexal tenderness or mass, no cervical motion tenderness.  LAB RESULTS Results for orders placed during the hospital encounter of 10/24/13 (from the past 24 hour(s))  URINALYSIS, ROUTINE W REFLEX MICROSCOPIC     Status: Abnormal  Collection Time    10/24/13  8:45 PM      Result Value Ref Range   Color, Urine YELLOW  YELLOW   APPearance CLEAR  CLEAR   Specific Gravity, Urine >1.030 (*) 1.005 - 1.030   pH 5.5  5.0 - 8.0   Glucose, UA NEGATIVE  NEGATIVE mg/dL   Hgb urine dipstick MODERATE (*) NEGATIVE   Bilirubin Urine NEGATIVE  NEGATIVE   Ketones, ur NEGATIVE  NEGATIVE mg/dL   Protein, ur NEGATIVE  NEGATIVE mg/dL   Urobilinogen, UA 0.2  0.0 - 1.0 mg/dL   Nitrite NEGATIVE  NEGATIVE   Leukocytes, UA NEGATIVE  NEGATIVE  URINE MICROSCOPIC-ADD ON     Status: Abnormal   Collection Time    10/24/13  8:45 PM      Result Value Ref Range    Squamous Epithelial / LPF RARE  RARE   WBC, UA 0-2  <3 WBC/hpf   RBC / HPF 7-10  <3 RBC/hpf   Bacteria, UA FEW (*) RARE  POCT PREGNANCY, URINE     Status: Abnormal   Collection Time    10/24/13  8:54 PM      Result Value Ref Range   Preg Test, Ur POSITIVE (*) NEGATIVE  WET PREP, GENITAL     Status: Abnormal   Collection Time    10/24/13  9:30 PM      Result Value Ref Range   Yeast Wet Prep HPF POC NONE SEEN  NONE SEEN   Trich, Wet Prep NONE SEEN  NONE SEEN   Clue Cells Wet Prep HPF POC NONE SEEN  NONE SEEN   WBC, Wet Prep HPF POC FEW (*) NONE SEEN  HCG, QUANTITATIVE, PREGNANCY     Status: Abnormal   Collection Time    10/24/13  9:45 PM      Result Value Ref Range   hCG, Beta Chain, Quant, S 6443 (*) <5 mIU/mL  CBC     Status: Abnormal   Collection Time    10/24/13  9:45 PM      Result Value Ref Range   WBC 10.9 (*) 4.0 - 10.5 K/uL   RBC 4.37  3.87 - 5.11 MIL/uL   Hemoglobin 12.4  12.0 - 15.0 g/dL   HCT 16.1  09.6 - 04.5 %   MCV 83.8  78.0 - 100.0 fL   MCH 28.4  26.0 - 34.0 pg   MCHC 33.9  30.0 - 36.0 g/dL   RDW 40.9  81.1 - 91.4 %   Platelets 298  150 - 400 K/uL    IMAGING US Ob Comp Less 14 Wks  10/24/2013   CLINICAL DATA:  Lower abdomen and back pain and nausea. Four weeks and 6 days pregnant by last menstrual period. Quantitative beta HCG 6,443.  EXAM: OBSTETRIC <14 WK ULTRASOUND  TECHNIQUE: Transabdominal ultrasound was performed for evaluation of the gestation as well as the maternal uterus and adnexal regions.  COMPARISON:  Previous examinations.  FINDINGS: Intrauterine gestational sac: Visualized/normal in shape.  Yolk sac:  Visualized/ normal in shape.  Embryo:  Possible small fetal pole associated with the yolk sac.  Cardiac Activity: None.  MSD: 8.4  mm   5 w   3  d     [place] EDC: 06/23/2014  Maternal uterus/adnexae: Probable second gestational sac within the uterus without a visible yolk sac or fetal pole. No subchorionic hemorrhage seen. Normal appearing maternal  ovaries with a right ovarian corpus luteum noted.  IMPRESSION: Probable twin intrauterine  gestation with only 1 yolk sac and possible developing fetal pole visible at this time. The estimated gestational age based on mean sac diameter of this sac is 5 weeks and 3 days. This could represent a viable twin gestation or fetal demise of one or both. A followup obstetrical ultrasound in 2 weeks and serial quantitative beta HCG determinations are recommended.   Electronically Signed   By: Gordan PaymentSteve  Reid M.D.   On: 10/24/2013 22:52    MAU COURSE UA, UPT, CBC, Quant, ultrasound, wet prep, GC/CT.  ASSESSMENT 1. Abdominal pain affecting pregnancy, antepartum   2. Musculoskeletal pain   3. Possible twin gestation. IUP confirmed.  PLAN Discharge home in stable condition. Viability ultrasound in 2 weeks. GC/CT and urine cultures pending. SAB precautions. Comfort measures. Tylenol when necessary. Follow-up Information   [facility] 2 weeks. (viability ultrasound)    Specialty:  Radiology   Contact information:   [address] [id number] [facility] [place] [address] [phone]      Follow up with Kathreen CosierMARSHALL,BERNARD A, MD. (Start prenatal care)    Specialty:  Obstetrics and Gynecology   Contact information:   [address] [place] [address] [phone]       [facility]. (As needed in emergencies)    Contact information:   [address] [id number] [facility]  [place] [address] [phone]       Medication List         CONCEPT OB 130-92.4-1 MG Caps  Take 1 tablet by mouth daily.     promethazine 25 MG tablet  Commonly known as:  PHENERGAN  Take 1 tablet (25 mg total) by mouth every 6 (six) hours as needed for nausea or vomiting.       Anita Allen, CNM 10/24/2013  9:20 PM

## 2013-10-24 NOTE — MAU Note (Signed)
Patient presents with complaint of lower abdominal and back pain X 2 weeks and nausea X 3 days.

## 2013-10-25 LAB — GC/CHLAMYDIA PROBE AMP
CT Probe RNA: NEGATIVE
GC Probe RNA: NEGATIVE

## 2013-10-27 NOTE — MAU Provider Note (Signed)

## 2013-11-08 ENCOUNTER — Ambulatory Visit (HOSPITAL_COMMUNITY): Admission: RE | Admit: 2013-11-08 | Payer: Medicaid Other | Source: Ambulatory Visit

## 2013-11-14 ENCOUNTER — Inpatient Hospital Stay (HOSPITAL_COMMUNITY): Payer: Medicaid Other

## 2013-11-14 ENCOUNTER — Inpatient Hospital Stay (HOSPITAL_COMMUNITY)
Admission: AD | Admit: 2013-11-14 | Discharge: 2013-11-14 | Disposition: A | Discharge status: Home / Self Care | Payer: Medicaid Other | Source: Ambulatory Visit | Attending: Obstetrics & Gynecology | Admitting: Obstetrics & Gynecology

## 2013-11-14 ENCOUNTER — Encounter (HOSPITAL_COMMUNITY): Payer: Self-pay | Admitting: *Deleted

## 2013-11-14 DIAGNOSIS — O208 Other hemorrhage in early pregnancy: Secondary | ICD-10-CM | POA: Insufficient documentation

## 2013-11-14 DIAGNOSIS — O418X10 Other specified disorders of amniotic fluid and membranes, first trimester, not applicable or unspecified: Secondary | ICD-10-CM

## 2013-11-14 DIAGNOSIS — O43899 Other placental disorders, unspecified trimester: Secondary | ICD-10-CM

## 2013-11-14 DIAGNOSIS — O468X1 Other antepartum hemorrhage, first trimester: Secondary | ICD-10-CM

## 2013-11-14 DIAGNOSIS — O26859 Spotting complicating pregnancy, unspecified trimester: Secondary | ICD-10-CM | POA: Diagnosis present

## 2013-11-14 LAB — URINALYSIS, ROUTINE W REFLEX MICROSCOPIC
Bilirubin Urine: NEGATIVE
Glucose, UA: NEGATIVE mg/dL
Ketones, ur: 15 mg/dL — AB
NITRITE: NEGATIVE
PH: 7.5 (ref 5.0–8.0)
Protein, ur: 30 mg/dL — AB
SPECIFIC GRAVITY, URINE: 1.015 (ref 1.005–1.030)
UROBILINOGEN UA: 1 mg/dL (ref 0.0–1.0)

## 2013-11-14 LAB — URINE MICROSCOPIC-ADD ON

## 2013-11-14 MED ORDER — PRENATAL VITAMINS 0.8 MG PO TABS: 1.0000 | ORAL_TABLET | Freq: Every day | ORAL | Status: DC
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
## 2013-11-14 NOTE — Discharge Instructions (Signed)
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
## 2013-11-29 ENCOUNTER — Encounter (HOSPITAL_COMMUNITY): Payer: Self-pay | Admitting: *Deleted

## 2013-11-29 ENCOUNTER — Inpatient Hospital Stay (HOSPITAL_COMMUNITY)
Admission: AD | Admit: 2013-11-29 | Discharge: 2013-11-29 | Disposition: A | Discharge status: Home / Self Care | Payer: Medicaid Other | Source: Ambulatory Visit | Attending: Obstetrics & Gynecology | Admitting: Obstetrics & Gynecology

## 2013-11-29 DIAGNOSIS — R42 Dizziness and giddiness: Secondary | ICD-10-CM | POA: Insufficient documentation

## 2013-11-29 DIAGNOSIS — O2390 Unspecified genitourinary tract infection in pregnancy, unspecified trimester: Secondary | ICD-10-CM | POA: Diagnosis not present

## 2013-11-29 DIAGNOSIS — R109 Unspecified abdominal pain: Secondary | ICD-10-CM | POA: Diagnosis not present

## 2013-11-29 DIAGNOSIS — N390 Urinary tract infection, site not specified: Secondary | ICD-10-CM | POA: Diagnosis not present

## 2013-11-29 DIAGNOSIS — B3731 Acute candidiasis of vulva and vagina: Secondary | ICD-10-CM

## 2013-11-29 DIAGNOSIS — M7918 Myalgia, other site: Secondary | ICD-10-CM

## 2013-11-29 DIAGNOSIS — B373 Candidiasis of vulva and vagina: Secondary | ICD-10-CM | POA: Diagnosis not present

## 2013-11-29 DIAGNOSIS — IMO0001 Reserved for inherently not codable concepts without codable children: Secondary | ICD-10-CM | POA: Diagnosis not present

## 2013-11-29 DIAGNOSIS — M549 Dorsalgia, unspecified: Secondary | ICD-10-CM | POA: Diagnosis present

## 2013-11-29 DIAGNOSIS — O2341 Unspecified infection of urinary tract in pregnancy, first trimester: Secondary | ICD-10-CM

## 2013-11-29 LAB — URINE MICROSCOPIC-ADD ON

## 2013-11-29 LAB — URINALYSIS, ROUTINE W REFLEX MICROSCOPIC
BILIRUBIN URINE: NEGATIVE
Glucose, UA: NEGATIVE mg/dL
Ketones, ur: NEGATIVE mg/dL
Nitrite: NEGATIVE
Protein, ur: NEGATIVE mg/dL
Specific Gravity, Urine: 1.025 (ref 1.005–1.030)
UROBILINOGEN UA: 0.2 mg/dL (ref 0.0–1.0)
pH: 6 (ref 5.0–8.0)

## 2013-11-29 LAB — CBC
HCT: 39.9 % (ref 36.0–46.0)
Hemoglobin: 13.5 g/dL (ref 12.0–15.0)
MCH: 28.4 pg (ref 26.0–34.0)
MCHC: 33.8 g/dL (ref 30.0–36.0)
MCV: 83.8 fL (ref 78.0–100.0)
Platelets: 308 10*3/uL (ref 150–400)
RBC: 4.76 MIL/uL (ref 3.87–5.11)
RDW: 14.2 % (ref 11.5–15.5)
WBC: 12 10*3/uL — ABNORMAL HIGH (ref 4.0–10.5)

## 2013-11-29 LAB — WET PREP, GENITAL
CLUE CELLS WET PREP: NONE SEEN
TRICH WET PREP: NONE SEEN
Yeast Wet Prep HPF POC: NONE SEEN

## 2013-11-29 MED ORDER — NITROFURANTOIN MONOHYD MACRO 100 MG PO CAPS: 100.0000 mg | ORAL_CAPSULE | Freq: Two times a day (BID) | ORAL | Status: DC

## 2013-11-29 MED ORDER — CYCLOBENZAPRINE HCL 10 MG PO TABS: 5.0000 mg | ORAL_TABLET | Freq: Three times a day (TID) | ORAL | Status: DC | PRN

## 2013-11-29 MED ORDER — FLUCONAZOLE 150 MG PO TABS: 150.0000 mg | ORAL_TABLET | Freq: Every day | ORAL | Status: DC

## 2013-11-29 NOTE — MAU Note (Signed)
Patient states she has been having low back pain, abdominal cramping, feels dizzy. Denies bleeding but has a slight vaginal discharge.

## 2013-11-29 NOTE — Discharge Instructions (Signed)
Pregnancy and Urinary Tract Infection °A urinary tract infection (UTI) is a bacterial infection of the urinary tract. Infection of the urinary tract can include the ureters, kidneys (pyelonephritis), bladder (cystitis), and urethra (urethritis). All pregnant women should be screened for bacteria in the urinary tract. Identifying and treating a UTI will decrease the risk of preterm labor and developing more serious infections in both the mother and baby. °CAUSES °Bacteria germs cause almost all UTIs.  °RISK FACTORS °Many factors can increase your chances of getting a UTI during pregnancy. These include: °· Having a short urethra. °· Poor toilet and hygiene habits. °· Sexual intercourse. °· Blockage of urine along the urinary tract. °· Problems with the pelvic muscles or nerves. °· Diabetes. °· Obesity. °· Bladder problems after having several children. °· Previous history of UTI. °SIGNS AND SYMPTOMS  °· Pain, burning, or a stinging feeling when urinating. °· Suddenly feeling the need to urinate right away (urgency). °· Loss of bladder control (urinary incontinence). °· Frequent urination, more than is common with pregnancy. °· Lower abdominal or back discomfort. °· Cloudy urine. °· Blood in the urine (hematuria). °· Fever.  °When the kidneys are infected, the symptoms may be: °· Back pain. °· Flank pain on the right side more so than the left. °· Fever. °· Chills. °· Nausea. °· Vomiting. °DIAGNOSIS  °A urinary tract infection is usually diagnosed through urine tests. Additional tests and procedures are sometimes done. These may include: °· Ultrasound exam of the kidneys, ureters, bladder, and urethra. °· Looking in the bladder with a lighted tube (cystoscopy). °TREATMENT °Typically, UTIs can be treated with antibiotic medicines.  °HOME CARE INSTRUCTIONS  °· Only take over-the-counter or prescription medicines as directed by your health care provider. If you were prescribed antibiotics, take them as directed. Finish  them even if you start to feel better. °· Drink enough fluids to keep your urine clear or pale yellow. °· Do not have sexual intercourse until the infection is gone and your health care provider says it is okay. °· Make sure you are tested for UTIs throughout your pregnancy. These infections often come back.  °Preventing a UTI in the Future °· Practice good toilet habits. Always wipe from front to back. Use the tissue only once. °· Do not hold your urine. Empty your bladder as soon as possible when the urge comes. °· Do not douche or use deodorant sprays. °· Wash with soap and warm water around the genital area and the anus. °· Empty your bladder before and after sexual intercourse. °· Wear underwear with a cotton crotch. °· Avoid caffeine and carbonated drinks. They can irritate the bladder. °· Drink cranberry juice or take cranberry pills. This may decrease the risk of getting a UTI. °· Do not drink alcohol. °· Keep all your appointments and tests as scheduled.  °SEEK MEDICAL CARE IF:  °· Your symptoms get worse. °· You are still having fevers 2 or more days after treatment begins. °· You have a rash. °· You feel that you are having problems with medicines prescribed. °· You have abnormal vaginal discharge. °SEEK IMMEDIATE MEDICAL CARE IF:  °· You have back or flank pain. °· You have chills. °· You have blood in your urine. °· You have nausea and vomiting. °· You have contractions of your uterus. °· You have a gush of fluid from the vagina. °MAKE SURE YOU: °· Understand these instructions.   °· Will watch your condition.   °· Will get help right away if you are not doing   well or get worse.  Document Released: 08/22/2010 Document Revised: 02/15/2013 Document Reviewed: 11/24/2012 [facility] Patient Information 2015 [place], [place]. This information is not intended to replace advice given to you by your health care provider. Make sure you discuss any questions you have with your health care  provider. Musculoskeletal Pain Musculoskeletal pain is muscle and boney aches and pains. These pains can occur in any part of the body. Your caregiver may treat you without knowing the cause of the pain. They may treat you if blood or urine tests, X-rays, and other tests were normal.  CAUSES There is often not a definite cause or reason for these pains. These pains may be caused by a type of germ (virus). The discomfort may also come from overuse. Overuse includes working out too hard when your body is not fit. Boney aches also come from weather changes. Bone is sensitive to atmospheric pressure changes. HOME CARE INSTRUCTIONS   Ask when your test results will be ready. Make sure you get your test results.  Only take over-the-counter or prescription medicines for pain, discomfort, or fever as directed by your caregiver. If you were given medications for your condition, do not drive, operate machinery or power tools, or sign legal documents for 24 hours. Do not drink alcohol. Do not take sleeping pills or other medications that may interfere with treatment.  Continue all activities unless the activities cause more pain. When the pain lessens, slowly resume normal activities. Gradually increase the intensity and duration of the activities or exercise.  During periods of severe pain, bed rest may be helpful. Lay or sit in any position that is comfortable.  Putting ice on the injured area.  Put ice in a bag.  Place a towel between your skin and the bag.  Leave the ice on for 15 to 20 minutes, 3 to 4 times a day.  Follow up with your caregiver for continued problems and no reason can be found for the pain. If the pain becomes worse or does not go away, it may be necessary to repeat tests or do additional testing. Your caregiver may need to look further for a possible cause. SEEK IMMEDIATE MEDICAL CARE IF:  You have pain that is getting worse and is not relieved by medications.  You develop  chest pain that is associated with shortness or breath, sweating, feeling sick to your stomach (nauseous), or throw up (vomit).  Your pain becomes localized to the abdomen.  You develop any new symptoms that seem different or that concern you. MAKE SURE YOU:   Understand these instructions.  Will watch your condition.  Will get help right away if you are not doing well or get worse. Document Released: 04/27/2005 Document Revised: 07/20/2011 Document Reviewed: 12/30/2012 [facility]Exit[facility] Patient Information 2015 [place]Care, [place]. This information is not intended to replace advice given to you by your health care provider. Make sure you discuss any questions you have with your health care provider.
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
## 2013-11-30 LAB — GC/CHLAMYDIA PROBE AMP
CT PROBE, AMP APTIMA: NEGATIVE
GC PROBE AMP APTIMA: NEGATIVE

## 2013-12-01 LAB — URINE CULTURE

## 2013-12-09 DIAGNOSIS — O020 Blighted ovum and nonhydatidiform mole: Secondary | ICD-10-CM

## 2013-12-09 HISTORY — DX: Blighted ovum and nonhydatidiform mole: O02.0

## 2013-12-20 ENCOUNTER — Inpatient Hospital Stay (HOSPITAL_COMMUNITY)
Admission: AD | Admit: 2013-12-20 | Discharge: 2013-12-20 | Disposition: A | Discharge status: Home / Self Care | Payer: Medicaid Other | Source: Ambulatory Visit | Attending: Obstetrics & Gynecology | Admitting: Obstetrics & Gynecology

## 2013-12-20 ENCOUNTER — Encounter (HOSPITAL_COMMUNITY): Payer: Self-pay | Admitting: Anesthesiology

## 2013-12-20 ENCOUNTER — Ambulatory Visit (HOSPITAL_COMMUNITY)
Admission: AD | Admit: 2013-12-20 | Discharge: 2013-12-20 | Disposition: A | Discharge status: Home / Self Care | Payer: Self-pay | Source: Ambulatory Visit | Attending: Obstetrics & Gynecology | Admitting: Obstetrics & Gynecology

## 2013-12-20 ENCOUNTER — Ambulatory Visit (HOSPITAL_COMMUNITY): Payer: Medicaid Other | Admitting: Anesthesiology

## 2013-12-20 ENCOUNTER — Ambulatory Visit (HOSPITAL_COMMUNITY)
Admission: RE | Admit: 2013-12-20 | Discharge: 2013-12-20 | Disposition: A | Discharge status: Home / Self Care | Payer: Medicaid Other | Source: Ambulatory Visit | Attending: Medical | Admitting: Medical

## 2013-12-20 ENCOUNTER — Encounter (HOSPITAL_COMMUNITY)
Admission: AD | Disposition: A | Discharge status: Home / Self Care | Payer: Self-pay | Source: Ambulatory Visit | Attending: Obstetrics & Gynecology

## 2013-12-20 DIAGNOSIS — O0289 Other abnormal products of conception: Secondary | ICD-10-CM | POA: Insufficient documentation

## 2013-12-20 DIAGNOSIS — O209 Hemorrhage in early pregnancy, unspecified: Secondary | ICD-10-CM

## 2013-12-20 DIAGNOSIS — O021 Missed abortion: Secondary | ICD-10-CM

## 2013-12-20 DIAGNOSIS — R109 Unspecified abdominal pain: Secondary | ICD-10-CM | POA: Insufficient documentation

## 2013-12-20 DIAGNOSIS — O020 Blighted ovum and nonhydatidiform mole: Secondary | ICD-10-CM

## 2013-12-20 HISTORY — PX: DILATION AND EVACUATION: SHX1459

## 2013-12-20 LAB — TYPE AND SCREEN
ABO/RH(D): A POS
ANTIBODY SCREEN: NEGATIVE

## 2013-12-20 LAB — CBC
HEMATOCRIT: 32.8 % — AB (ref 36.0–46.0)
Hemoglobin: 11.1 g/dL — ABNORMAL LOW (ref 12.0–15.0)
MCH: 28.6 pg (ref 26.0–34.0)
MCHC: 33.8 g/dL (ref 30.0–36.0)
MCV: 84.5 fL (ref 78.0–100.0)
Platelets: 264 10*3/uL (ref 150–400)
RBC: 3.88 MIL/uL (ref 3.87–5.11)
RDW: 14.3 % (ref 11.5–15.5)
WBC: 7.4 10*3/uL (ref 4.0–10.5)

## 2013-12-20 LAB — HCG, QUANTITATIVE, PREGNANCY: hCG, Beta Chain, Quant, S: 939 m[IU]/mL — ABNORMAL HIGH (ref <5)

## 2013-12-20 SURGERY — DILATION AND EVACUATION, UTERUS
Anesthesia: General | Site: Vagina

## 2013-12-20 MED ORDER — FLUMAZENIL 0.5 MG/5ML IV SOLN
INTRAVENOUS | Status: DC | PRN
  Administered 2013-12-20: 0.2 mg via INTRAVENOUS

## 2013-12-20 MED ORDER — FENTANYL CITRATE 0.05 MG/ML IJ SOLN: 25.0000 ug | INTRAMUSCULAR | Status: DC | PRN

## 2013-12-20 MED ORDER — FLUMAZENIL 0.5 MG/5ML IV SOLN
INTRAVENOUS | Status: AC
  Filled 2013-12-20: qty 5

## 2013-12-20 MED ORDER — ONDANSETRON HCL 4 MG/2ML IJ SOLN
INTRAMUSCULAR | Status: AC
  Filled 2013-12-20: qty 2

## 2013-12-20 MED ORDER — KETOROLAC TROMETHAMINE 30 MG/ML IJ SOLN
INTRAMUSCULAR | Status: AC
  Filled 2013-12-20: qty 1

## 2013-12-20 MED ORDER — SCOPOLAMINE 1 MG/3DAYS TD PT72
1.0000 | MEDICATED_PATCH | Freq: Once | TRANSDERMAL | Status: DC
  Administered 2013-12-20: 1.5 mg via TRANSDERMAL

## 2013-12-20 MED ORDER — LIDOCAINE HCL (CARDIAC) 20 MG/ML IV SOLN
INTRAVENOUS | Status: DC | PRN
  Administered 2013-12-20: 30 mg via INTRAVENOUS
  Administered 2013-12-20: 70 mg via INTRAVENOUS

## 2013-12-20 MED ORDER — KETOROLAC TROMETHAMINE 30 MG/ML IJ SOLN: 15.0000 mg | Freq: Once | INTRAMUSCULAR | Status: DC | PRN

## 2013-12-20 MED ORDER — MEPERIDINE HCL 25 MG/ML IJ SOLN: 6.2500 mg | INTRAMUSCULAR | Status: DC | PRN

## 2013-12-20 MED ORDER — LACTATED RINGERS IV SOLN
INTRAVENOUS | Status: DC
  Administered 2013-12-20: 14:00:00 via INTRAVENOUS

## 2013-12-20 MED ORDER — FENTANYL CITRATE 0.05 MG/ML IJ SOLN
INTRAMUSCULAR | Status: DC | PRN
  Administered 2013-12-20 (×2): 50 ug via INTRAVENOUS

## 2013-12-20 MED ORDER — PROPOFOL 10 MG/ML IV BOLUS
INTRAVENOUS | Status: DC | PRN
  Administered 2013-12-20: 150 mg via INTRAVENOUS

## 2013-12-20 MED ORDER — BUPIVACAINE-EPINEPHRINE (PF) 0.5% -1:200000 IJ SOLN
INTRAMUSCULAR | Status: AC
  Filled 2013-12-20: qty 30

## 2013-12-20 MED ORDER — MIDAZOLAM HCL 2 MG/2ML IJ SOLN
INTRAMUSCULAR | Status: DC | PRN
  Administered 2013-12-20: 2 mg via INTRAVENOUS

## 2013-12-20 MED ORDER — FENTANYL CITRATE 0.05 MG/ML IJ SOLN
INTRAMUSCULAR | Status: AC
  Filled 2013-12-20: qty 2

## 2013-12-20 MED ORDER — PROPOFOL 10 MG/ML IV EMUL
INTRAVENOUS | Status: AC
  Filled 2013-12-20: qty 20

## 2013-12-20 MED ORDER — KETOROLAC TROMETHAMINE 30 MG/ML IJ SOLN
INTRAMUSCULAR | Status: DC | PRN
  Administered 2013-12-20: 15 mg via INTRAVENOUS

## 2013-12-20 MED ORDER — ONDANSETRON HCL 4 MG/2ML IJ SOLN
INTRAMUSCULAR | Status: DC | PRN
  Administered 2013-12-20: 4 mg via INTRAVENOUS

## 2013-12-20 MED ORDER — ONDANSETRON HCL 4 MG/2ML IJ SOLN: 4.0000 mg | Freq: Once | INTRAMUSCULAR | Status: DC | PRN

## 2013-12-20 MED ORDER — ACETAMINOPHEN-CODEINE #3 300-30 MG PO TABS
1.0000 | ORAL_TABLET | Freq: Four times a day (QID) | ORAL | Status: DC | PRN
Start: 2013-12-20 — End: 2014-05-29

## 2013-12-20 MED ORDER — MIDAZOLAM HCL 2 MG/2ML IJ SOLN
INTRAMUSCULAR | Status: AC
  Filled 2013-12-20: qty 2

## 2013-12-20 MED ORDER — LIDOCAINE HCL (CARDIAC) 20 MG/ML IV SOLN
INTRAVENOUS | Status: AC
  Filled 2013-12-20: qty 5

## 2013-12-20 MED ORDER — LACTATED RINGERS IV SOLN: INTRAVENOUS | Status: DC

## 2013-12-20 MED ORDER — SCOPOLAMINE 1 MG/3DAYS TD PT72
MEDICATED_PATCH | TRANSDERMAL | Status: DC
Start: 2013-12-20 — End: 2013-12-20
  Filled 2013-12-20: qty 1

## 2013-12-20 MED ORDER — BUPIVACAINE-EPINEPHRINE 0.5% -1:200000 IJ SOLN
INTRAMUSCULAR | Status: DC | PRN
  Administered 2013-12-20: 10 mL

## 2013-12-20 MED ORDER — DOXYCYCLINE HYCLATE 100 MG IV SOLR
200.0000 mg | Freq: Once | INTRAVENOUS | Status: AC
  Administered 2013-12-20: 200 mg via INTRAVENOUS
  Filled 2013-12-20: qty 200

## 2013-12-20 SURGICAL SUPPLY — 20 items
CATH ROBINSON RED A/P 16FR (CATHETERS) ×3 IMPLANT
CLOTH BEACON ORANGE TIMEOUT ST (SAFETY) ×3 IMPLANT
DECANTER SPIKE VIAL GLASS SM (MISCELLANEOUS) ×3 IMPLANT
GLOVE BIO SURGEON STRL SZ 6.5 (GLOVE) ×2 IMPLANT
GLOVE BIO SURGEONS STRL SZ 6.5 (GLOVE) ×1
GLOVE BIOGEL PI IND STRL 7.0 (GLOVE) ×1 IMPLANT
GLOVE BIOGEL PI INDICATOR 7.0 (GLOVE) ×2
GOWN STRL REUS W/TWL LRG LVL3 (GOWN DISPOSABLE) ×6 IMPLANT
KIT BERKELEY 1ST TRIMESTER 3/8 (MISCELLANEOUS) ×6 IMPLANT
NS IRRIG 1000ML POUR BTL (IV SOLUTION) ×3 IMPLANT
PACK VAGINAL MINOR WOMEN LF (CUSTOM PROCEDURE TRAY) ×3 IMPLANT
PAD OB MATERNITY 4.3X12.25 (PERSONAL CARE ITEMS) ×3 IMPLANT
PAD PREP 24X48 CUFFED NSTRL (MISCELLANEOUS) ×3 IMPLANT
SET BERKELEY SUCTION TUBING (SUCTIONS) ×3 IMPLANT
SLEEVE SCD COMPRESS KNEE MED (MISCELLANEOUS) ×3 IMPLANT
TOWEL OR 17X24 6PK STRL BLUE (TOWEL DISPOSABLE) ×6 IMPLANT
VACURETTE 10 RIGID CVD (CANNULA) ×3 IMPLANT
VACURETTE 7MM CVD STRL WRAP (CANNULA) IMPLANT
VACURETTE 8 RIGID CVD (CANNULA) IMPLANT
VACURETTE 9 RIGID CVD (CANNULA) IMPLANT

## 2013-12-20 NOTE — Anesthesia Postprocedure Evaluation (Signed)
Anesthesia Post Note  Patient: Anita Allen  Procedure(s) Performed: Procedure(s) (LRB): DILATATION AND EVACUATION (N/A)  Anesthesia type: General  Patient location: PACU  Post pain: Pain level controlled  Post assessment: Post-op Vital signs reviewed  Last Vitals:  Filed Vitals:   12/20/13 1545  BP: 113/76  Pulse: 69  Temp:   Resp: 14    Post vital signs: Reviewed  Level of consciousness: sedated  Complications: No apparent anesthesia complications

## 2013-12-20 NOTE — Anesthesia Preprocedure Evaluation (Signed)
Anesthesia Evaluation  Patient identified by MRN, date of birth, ID band Patient awake    Reviewed: Allergy & Precautions, H&P , NPO status , Patient's Chart, lab work & pertinent test results  Airway Mallampati: I TM Distance: >3 FB Neck ROM: full    Dental no notable dental hx. (+) Teeth Intact   Pulmonary neg pulmonary ROS,    Pulmonary exam normal       Cardiovascular negative cardio ROS      Neuro/Psych negative neurological ROS  negative psych ROS   GI/Hepatic negative GI ROS, Neg liver ROS,   Endo/Other  negative endocrine ROS  Renal/GU negative Renal ROS     Musculoskeletal   Abdominal Normal abdominal exam  (+)   Peds  Hematology negative hematology ROS (+)   Anesthesia Other Findings   Reproductive/Obstetrics (+) Pregnancy                           Anesthesia Physical Anesthesia Plan  ASA: II  Anesthesia Plan: General   Post-op Pain Management:    Induction: Intravenous  Airway Management Planned: LMA  Additional Equipment:   Intra-op Plan:   Post-operative Plan:   Informed Consent: I have reviewed the patients History and Physical, chart, labs and discussed the procedure including the risks, benefits and alternatives for the proposed anesthesia with the patient or authorized representative who has indicated his/her understanding and acceptance.     Plan Discussed with: CRNA and Surgeon  Anesthesia Plan Comments:         Anesthesia Quick Evaluation

## 2013-12-20 NOTE — Discharge Instructions (Signed)
DISCHARGE INSTRUCTIONS: D&C / D&E The following instructions have been prepared to help you care for yourself upon your return home.  May take Ibuprofen after 8:18pm as needed for cramps.   Personal hygiene:  Use sanitary pads for vaginal drainage, not tampons.  Shower the day after your procedure.  NO tub baths, pools or Jacuzzis for 2-3 weeks.  Wipe front to back after using the bathroom.  Activity and limitations:  Do NOT drive or operate any equipment for 24 hours. The effects of anesthesia are still present and drowsiness may result.  Do NOT rest in bed all day.  Walking is encouraged.  Walk up and down stairs slowly.  You may resume your normal activity in one to two days or as indicated by your physician.  Sexual activity: NO intercourse for at least 2 weeks after the procedure, or as indicated by your physician.  Diet: Eat a light meal as desired this evening. You may resume your usual diet tomorrow.  Return to work: You may resume your work activities in one to two days or as indicated by your doctor.  What to expect after your surgery: Expect to have vaginal bleeding/discharge for 2-3 days and spotting for up to 10 days. It is not unusual to have soreness for up to 1-2 weeks. You may have a slight burning sensation when you urinate for the first day. Mild cramps may continue for a couple of days. You may have a regular period in 2-6 weeks.  Call your doctor for any of the following:  Excessive vaginal bleeding, saturating and changing one pad every hour.  Inability to urinate 6 hours after discharge from hospital.  Pain not relieved by pain medication.  Fever of 100.4 F or greater.  Unusual vaginal discharge or odor.   Call for an appointment:    Patients signature: ______________________  Nurses signature ________________________  Support person's signature_______________________
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
## 2013-12-20 NOTE — Op Note (Addendum)
Cesarean Section Procedure Note   Anita Allen  12/20/2013  Indications: suspected molar pregnancy by [place]    Pre-operative Diagnosis: molar pregnancy.   Post-operative Diagnosis: Same   Surgeon: Surgeon(s) and Role:    * Adam Phenix, MD - Primary   Assistants: none  Anesthesia: LMA , paracervical block  Procedure Details:  The patient was seen in the Holding Room. The risks, benefits, complications, treatment options, and expected outcomes were discussed with the patient. The patient concurred with the proposed plan, giving informed consent. identified as Anita Allen and the procedure verified as suction dilation and curettage. A Time Out was held and the above information confirmed.  After induction of anesthesia, the patient was draped and prepped in the usual sterile manner. Bladder was drained with a red rubber catheter. The uterus appeared to be 10 weeks size and globular no adnexal masses. Speculum was inserted and cervix was visualized. Half percent Marcaine with 1 200,000 epinephrine was infiltrated for intracervical block. Cervix was dilated sufficiently to pass a 10 mm suction curette and suction curettage was performed. There was immediate appearance of dark blood in the tubing and products of conception. Patient received IV Pitocin during the procedure. Suction curettage was performed until complete evacuation of the uterine cavity was assured. There was minimal bleeding at the end of procedure. All instruments were removed. She received 200 mg of IV doxycycline during the procedure. She is brought in stable condition to PACU.    Findings: Blood and products of conception   Estimated Blood Loss: 200 mL of old blood  Total IV Fluids: 600 mL  Urine Output: 100CC OF clear urine  Specimens: Products of conception  Complications: no complications  Disposition: PACU - hemodynamically stable.    Condition: stable     Attending Attestation: I was present and  scrubbed for the entire procedure.   Signed: Surgeon(s): Adam Phenix, MD

## 2013-12-20 NOTE — MAU Note (Signed)
Chart in downtime
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
## 2013-12-20 NOTE — Transfer of Care (Signed)
Immediate Anesthesia Transfer of Care Note  Patient: Anita Allen  Procedure(s) Performed: Procedure(s): DILATATION AND EVACUATION (N/A)  Patient Location: PACU  Anesthesia Type:General  Level of Consciousness: sedated and patient cooperative  Airway & Oxygen Therapy: Patient Spontanous Breathing and Patient connected to nasal cannula oxygen  Post-op Assessment: Report given to PACU RN and Post -op Vital signs reviewed and stable  Post vital signs: Reviewed and stable  Complications: No apparent anesthesia complications

## 2013-12-20 NOTE — MAU Provider Note (Signed)
Attestation of Attending Supervision of Advanced Practitioner (CNM/NP): Evaluation and management procedures were performed by the Advanced Practitioner under my supervision and collaboration.  I have reviewed the Advanced Practitioner's note and chart, and I agree with the management and plan.  Pt scheduled for D&E today at 1:00pm.  I have attempted to reach patient by telephone with no success.  Will continue to attempt to call.  HARRAWAY-SMITH, Sheilla Maris 6:53 AM

## 2013-12-21 ENCOUNTER — Encounter (HOSPITAL_COMMUNITY): Payer: Self-pay | Admitting: Obstetrics & Gynecology

## 2014-01-01 ENCOUNTER — Encounter: Payer: Self-pay | Admitting: General Practice

## 2014-01-10 ENCOUNTER — Ambulatory Visit: Payer: Medicaid Other | Admitting: Obstetrics & Gynecology

## 2014-01-10 ENCOUNTER — Telehealth: Payer: Self-pay

## 2014-01-10 NOTE — Telephone Encounter (Signed)
Patient missed today's post-op appointment with Dr. Debroah Loop. Attempted to contact patient, heard "the person you are calling is not accepting calls at this time." Unable to leave message. Will send letter.

## 2014-01-17 ENCOUNTER — Telehealth: Payer: Self-pay

## 2014-01-17 NOTE — Telephone Encounter (Signed)
Patient called stating she was given a letter for work after having surgery stating she would need to be out for a week, however, it was dated 01/01/14 when it was written instead of 12/20/13 when patient was seen and surgery performed. [facility] would not accept this letter and states that it must be dated the day of surgery. Informed patient letter could be re-written to accommodate need. Letter edited and faxed to [phone] ATTN: Will Torez per patient request.

## 2014-03-12 ENCOUNTER — Encounter (HOSPITAL_COMMUNITY): Payer: Self-pay | Admitting: Obstetrics & Gynecology

## 2014-04-10 ENCOUNTER — Emergency Department (HOSPITAL_COMMUNITY)
Admission: EM | Admit: 2014-04-10 | Discharge: 2014-04-10 | Disposition: A | Discharge status: Home / Self Care | Payer: Self-pay | Attending: Emergency Medicine | Admitting: Emergency Medicine

## 2014-04-10 ENCOUNTER — Emergency Department (HOSPITAL_COMMUNITY): Payer: Self-pay

## 2014-04-10 ENCOUNTER — Encounter (HOSPITAL_COMMUNITY): Payer: Self-pay | Admitting: *Deleted

## 2014-04-10 DIAGNOSIS — Y92410 Unspecified street and highway as the place of occurrence of the external cause: Secondary | ICD-10-CM | POA: Insufficient documentation

## 2014-04-10 DIAGNOSIS — N898 Other specified noninflammatory disorders of vagina: Secondary | ICD-10-CM | POA: Insufficient documentation

## 2014-04-10 DIAGNOSIS — S3992XA Unspecified injury of lower back, initial encounter: Secondary | ICD-10-CM | POA: Insufficient documentation

## 2014-04-10 DIAGNOSIS — Y9389 Activity, other specified: Secondary | ICD-10-CM | POA: Insufficient documentation

## 2014-04-10 DIAGNOSIS — Z792 Long term (current) use of antibiotics: Secondary | ICD-10-CM | POA: Insufficient documentation

## 2014-04-10 DIAGNOSIS — S20211A Contusion of right front wall of thorax, initial encounter: Secondary | ICD-10-CM | POA: Insufficient documentation

## 2014-04-10 DIAGNOSIS — Z91040 Latex allergy status: Secondary | ICD-10-CM | POA: Insufficient documentation

## 2014-04-10 DIAGNOSIS — Y998 Other external cause status: Secondary | ICD-10-CM | POA: Insufficient documentation

## 2014-04-10 DIAGNOSIS — Z880 Allergy status to penicillin: Secondary | ICD-10-CM | POA: Insufficient documentation

## 2014-04-10 DIAGNOSIS — S161XXA Strain of muscle, fascia and tendon at neck level, initial encounter: Secondary | ICD-10-CM | POA: Insufficient documentation

## 2014-04-10 MED ORDER — IBUPROFEN 600 MG PO TABS
600.0000 mg | ORAL_TABLET | Freq: Four times a day (QID) | ORAL | Status: DC | PRN
Start: 2014-04-10 — End: 2014-05-29

## 2014-04-10 MED ORDER — IBUPROFEN 200 MG PO TABS
600.0000 mg | ORAL_TABLET | Freq: Once | ORAL | Status: AC
  Administered 2014-04-10: 600 mg via ORAL
  Filled 2014-04-10: qty 3
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
## 2014-04-10 NOTE — ED Notes (Signed)
Patient transported to X-ray 

## 2014-04-19 ENCOUNTER — Encounter (HOSPITAL_COMMUNITY): Payer: Self-pay

## 2014-04-19 ENCOUNTER — Emergency Department (HOSPITAL_COMMUNITY)
Admission: EM | Admit: 2014-04-19 | Discharge: 2014-04-19 | Disposition: A | Discharge status: Home / Self Care | Payer: Self-pay | Attending: Emergency Medicine | Admitting: Emergency Medicine

## 2014-04-19 ENCOUNTER — Emergency Department (HOSPITAL_COMMUNITY): Payer: Medicaid Other

## 2014-04-19 DIAGNOSIS — Z91040 Latex allergy status: Secondary | ICD-10-CM | POA: Insufficient documentation

## 2014-04-19 DIAGNOSIS — Z880 Allergy status to penicillin: Secondary | ICD-10-CM | POA: Insufficient documentation

## 2014-04-19 DIAGNOSIS — M549 Dorsalgia, unspecified: Secondary | ICD-10-CM | POA: Insufficient documentation

## 2014-04-19 DIAGNOSIS — Z8619 Personal history of other infectious and parasitic diseases: Secondary | ICD-10-CM | POA: Insufficient documentation

## 2014-04-19 DIAGNOSIS — R0789 Other chest pain: Secondary | ICD-10-CM | POA: Insufficient documentation

## 2014-04-19 DIAGNOSIS — R079 Chest pain, unspecified: Secondary | ICD-10-CM

## 2014-04-19 LAB — CBC
HEMATOCRIT: 34.3 % — AB (ref 36.0–46.0)
Hemoglobin: 10.9 g/dL — ABNORMAL LOW (ref 12.0–15.0)
MCH: 24.9 pg — AB (ref 26.0–34.0)
MCHC: 31.8 g/dL (ref 30.0–36.0)
MCV: 78.3 fL (ref 78.0–100.0)
PLATELETS: 296 10*3/uL (ref 150–400)
RBC: 4.38 MIL/uL (ref 3.87–5.11)
RDW: 16.8 % — ABNORMAL HIGH (ref 11.5–15.5)
WBC: 7.8 10*3/uL (ref 4.0–10.5)

## 2014-04-19 LAB — BASIC METABOLIC PANEL
ANION GAP: 10 (ref 5–15)
BUN: 7 mg/dL (ref 6–23)
CALCIUM: 9.1 mg/dL (ref 8.4–10.5)
CHLORIDE: 106 meq/L (ref 96–112)
CO2: 22 mEq/L (ref 19–32)
CREATININE: 0.64 mg/dL (ref 0.50–1.10)
GFR calc Af Amer: >90 mL/min (ref >90)
GFR calc non Af Amer: >90 mL/min (ref >90)
Glucose, Bld: 95 mg/dL (ref 70–99)
Potassium: 3.9 mEq/L (ref 3.7–5.3)
Sodium: 138 mEq/L (ref 137–147)

## 2014-04-19 LAB — I-STAT TROPONIN, ED: Troponin i, poc: 0 ng/mL (ref 0.00–0.08)

## 2014-04-19 MED ORDER — IBUPROFEN 200 MG PO TABS
600.0000 mg | ORAL_TABLET | Freq: Once | ORAL | Status: AC
  Administered 2014-04-19: 600 mg via ORAL
  Filled 2014-04-19: qty 3

## 2014-04-19 MED ORDER — IBUPROFEN 600 MG PO TABS: 600.0000 mg | ORAL_TABLET | Freq: Four times a day (QID) | ORAL | Status: DC | PRN
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
## 2014-04-19 NOTE — ED Notes (Signed)
Pt denies SOB, lightheadedness, dizziness, N/V.

## 2014-04-19 NOTE — Discharge Instructions (Signed)
We saw you in the ER for the chest pain/shortness of breath. All of our cardiac workup is normal, including labs, EKG and chest X-RAY are normal. We are not sure what is causing your discomfort, but we feel comfortable sending you home at this time. The workup in the ER is not complete, and you should follow up with your primary care doctor for further evaluation.   Chest Wall Pain Chest wall pain is pain in or around the bones and muscles of your chest. It may take up to 6 weeks to get better. It may take longer if you must stay physically active in your work and activities.  CAUSES  Chest wall pain may happen on its own. However, it may be caused by:  A viral illness like the flu.  Injury.  Coughing.  Exercise.  Arthritis.  Fibromyalgia.  Shingles. HOME CARE INSTRUCTIONS   Avoid overtiring physical activity. Try not to strain or perform activities that cause pain. This includes any activities using your chest or your abdominal and side muscles, especially if heavy weights are used.  Put ice on the sore area.  Put ice in a plastic bag.  Place a towel between your skin and the bag.  Leave the ice on for 15-20 minutes per hour while awake for the first 2 days.  Only take over-the-counter or prescription medicines for pain, discomfort, or fever as directed by your caregiver. SEEK IMMEDIATE MEDICAL CARE IF:   Your pain increases, or you are very uncomfortable.  You have a fever.  Your chest pain becomes worse.  You have new, unexplained symptoms.  You have nausea or vomiting.  You feel sweaty or lightheaded.  You have a cough with phlegm (sputum), or you cough up blood. MAKE SURE YOU:   Understand these instructions.  Will watch your condition.  Will get help right away if you are not doing well or get worse. Document Released: 04/27/2005 Document Revised: 07/20/2011 Document Reviewed: 12/22/2010 [facility] Patient Information 2015 [employer] [place]. This  information is not intended to replace advice given to you by your health care provider. Make sure you discuss any questions you have with your health care provider.

## 2014-04-19 NOTE — ED Notes (Signed)
Pt EKG delayed.  Pt continued to remain on cell phone. RN had to ask patient to place phone down in order to complete triage.

## 2014-04-22 NOTE — ED Provider Notes (Signed)
CSN: [id number]     Arrival date & time 04/19/14  1238 History   First MD Initiated Contact with Patient 04/19/14 1334     Chief Complaint  Patient presents with  . Chest Pain  . Back Pain     (Consider location/radiation/quality/duration/timing/severity/associated sxs/prior Treatment) HPI Comments: Pt comes in with cc of chest pain. She has no medical hx, denies any premature CAD in family and no smoking, substance abuse. C/O chest pain, constant, lower left chest, moving to the back and towards the arm. Pain is constant with no specific aggravating or relieving factors. Pain is not exertional, pleuritic. NO cough. No hx of similar pain.  Patient is a 23 y.o. female presenting with chest pain and back pain. The history is provided by the patient.  Chest Pain Associated symptoms: back pain   Associated symptoms: no abdominal pain, no cough, no nausea, no shortness of breath and not vomiting   Back Pain Associated symptoms: chest pain   Associated symptoms: no abdominal pain     Past Medical History  Diagnosis Date  . No pertinent past medical history   . Gonorrhea contact, treated   . Vaginal delivery 2012, 2013   Past Surgical History  Procedure Laterality Date  . No past surgeries    . Dilation and evacuation N/A 12/20/2013    Procedure: DILATATION AND EVACUATION;  Surgeon: Adam PhenixJames G Arnold, MD;  Location: [facility];  Service: Gynecology;  Laterality: N/A;   Family History  Problem Relation Age of Onset  . Heart disease Father    History  Substance Use Topics  . Smoking status: Never Smoker   . Smokeless tobacco: Never Used  . Alcohol Use: No   OB History    Gravida Para Term Preterm AB TAB SAB Ectopic Multiple Living   4 2 2  0 1 1 0 0 0 2     Review of Systems  Constitutional: Negative for activity change.  HENT: Negative for facial swelling.   Respiratory: Negative for cough, shortness of breath and wheezing.   Cardiovascular: Positive for chest pain.   Gastrointestinal: Negative for nausea, vomiting, abdominal pain, diarrhea, constipation, blood in stool and abdominal distention.  Genitourinary: Negative for hematuria and difficulty urinating.  Musculoskeletal: Positive for back pain. Negative for neck pain.  Skin: Negative for color change.  Neurological: Negative for speech difficulty.  Hematological: Does not bruise/bleed easily.  Psychiatric/Behavioral: Negative for confusion.      Allergies  Amoxicillin-pot clavulanate and Latex  Home Medications   Prior to Admission medications   Medication Sig Start Date End Date Taking? Authorizing Provider  acetaminophen-codeine (TYLENOL #3) 300-30 MG per tablet Take 1-2 tablets by mouth every 6 (six) hours as needed for moderate pain. 12/20/13   Adam PhenixJames G Arnold, MD  ibuprofen (ADVIL,MOTRIN) 600 MG tablet Take 1 tablet (600 mg total) by mouth every 6 (six) hours as needed. 04/10/14   Arman FilterGail K Schulz, NP  ibuprofen (ADVIL,MOTRIN) 600 MG tablet Take 1 tablet (600 mg total) by mouth every 6 (six) hours as needed. 04/19/14   Derwood KaplanAnkit Krayton Wortley, MD  nitrofurantoin, macrocrystal-monohydrate, (MACROBID) 100 MG capsule Take 1 capsule (100 mg total) by mouth 2 (two) times daily. Patient not taking: Reported on 04/19/2014 11/29/13   Misty StanleyLisa A Leftwich-Kirby, CNM   BP 106/58 mmHg  Pulse 73  Temp(Src) 98.8 F (37.1 C) (Oral)  Resp 16  SpO2 100%  LMP 03/29/2014 Physical Exam  Constitutional: She is oriented to person, place, and time. She appears well-developed and  well-nourished.  HENT:  Head: Normocephalic and atraumatic.  Eyes: EOM are normal. Pupils are equal, round, and reactive to light.  Neck: Neck supple.  Cardiovascular: Normal rate, regular rhythm and normal heart sounds.   No murmur heard. Pulmonary/Chest: Effort normal. No respiratory distress. She exhibits tenderness.  Chest pain reproduced with deep palpation  Abdominal: Soft. She exhibits no distension. There is no tenderness. There is no  rebound and no guarding.  Neurological: She is alert and oriented to person, place, and time.  Skin: Skin is warm and dry.  Nursing note and vitals reviewed.   ED Course  Procedures (including critical care time) Labs Review Labs Reviewed  CBC - Abnormal; Notable for the following:    Hemoglobin 10.9 (*)    HCT 34.3 (*)    MCH 24.9 (*)    RDW 16.8 (*)    All other components within normal limits  BASIC METABOLIC PANEL  I-STAT TROPOININ, ED    Imaging Review No results found.   EKG Interpretation   Date/Time:  Thursday April 19 2014 12:54:31 EST Ventricular Rate:  83 PR Interval:  124 QRS Duration: 75 QT Interval:  350 QTC Calculation: 411 R Axis:   76 Text Interpretation:  Sinus rhythm q wave avL and avR Poor R wave  progression No acute changes No old tracing to compare Confirmed by  Rhunette CroftNANAVATI, MD, Janey GentaANKIT [phone]([id number]) on 04/19/2014 1:35:27 PM      MDM   Final diagnoses:  Chest pain  Chest wall pain    Pt comes in with cc of chest pain. Pt is worse with palpation. EKG and labs unremarkable. Pain doesn't appear to be cardiac in etiology at all, and she has no risk factors for it. Pain doesn't appear to be due to PE either, and she is PERC neg. Will d.c with motrin.  Derwood KaplanAnkit Blane Worthington, MD 04/22/14 1739

## 2014-05-11 NOTE — L&D Delivery Note (Signed)
Delivery Note At 9:15 PM a viable female was delivered via  (Presentation: ;  ).  APGAR: , ; weight  .   Placenta status: , .  Cord:  with the following complications: .  Cord pH: not done  Anesthesia:   Episiotomy:   Lacerations:   Suture Repair: 2.0 vicryl Est. Blood Loss (mL):    Mom to postpartum.  Baby to Couplet care / Skin to Skin.  Nyron Mozer A 01/17/2015, 9:26 PM

## 2014-05-29 ENCOUNTER — Encounter (HOSPITAL_COMMUNITY): Payer: Self-pay | Admitting: *Deleted

## 2014-05-29 ENCOUNTER — Inpatient Hospital Stay (HOSPITAL_COMMUNITY)
Admission: AD | Admit: 2014-05-29 | Discharge: 2014-05-29 | Disposition: A | Discharge status: Home / Self Care | Payer: Medicaid Other | Source: Ambulatory Visit | Attending: Obstetrics and Gynecology | Admitting: Obstetrics and Gynecology

## 2014-05-29 ENCOUNTER — Inpatient Hospital Stay (HOSPITAL_COMMUNITY): Payer: Medicaid Other

## 2014-05-29 DIAGNOSIS — Z3201 Encounter for pregnancy test, result positive: Secondary | ICD-10-CM | POA: Insufficient documentation

## 2014-05-29 DIAGNOSIS — N72 Inflammatory disease of cervix uteri: Secondary | ICD-10-CM | POA: Insufficient documentation

## 2014-05-29 DIAGNOSIS — O0911 Supervision of pregnancy with history of ectopic or molar pregnancy, first trimester: Secondary | ICD-10-CM

## 2014-05-29 DIAGNOSIS — O09A0 Supervision of pregnancy with history of molar pregnancy, unspecified trimester: Secondary | ICD-10-CM

## 2014-05-29 HISTORY — DX: Blighted ovum and nonhydatidiform mole: O02.0

## 2014-05-29 LAB — URINALYSIS, ROUTINE W REFLEX MICROSCOPIC
BILIRUBIN URINE: NEGATIVE
Glucose, UA: NEGATIVE mg/dL
Ketones, ur: NEGATIVE mg/dL
Nitrite: NEGATIVE
Protein, ur: NEGATIVE mg/dL
Specific Gravity, Urine: >1.03 — ABNORMAL HIGH (ref 1.005–1.030)
UROBILINOGEN UA: 0.2 mg/dL (ref 0.0–1.0)
pH: 6 (ref 5.0–8.0)

## 2014-05-29 LAB — POCT PREGNANCY, URINE
PREG TEST UR: POSITIVE — AB
PREG TEST UR: POSITIVE — AB

## 2014-05-29 LAB — WET PREP, GENITAL
TRICH WET PREP: NONE SEEN
Yeast Wet Prep HPF POC: NONE SEEN

## 2014-05-29 LAB — URINE MICROSCOPIC-ADD ON

## 2014-05-29 LAB — HCG, QUANTITATIVE, PREGNANCY: HCG, BETA CHAIN, QUANT, S: 4102 m[IU]/mL — AB (ref <5)

## 2014-05-29 MED ORDER — AZITHROMYCIN 250 MG PO TABS
1000.0000 mg | ORAL_TABLET | Freq: Once | ORAL | Status: AC
  Administered 2014-05-29: 1000 mg via ORAL
  Filled 2014-05-29: qty 4

## 2014-05-29 MED ORDER — CEFTRIAXONE SODIUM 250 MG IJ SOLR
250.0000 mg | Freq: Once | INTRAMUSCULAR | Status: AC
  Administered 2014-05-29: 250 mg via INTRAMUSCULAR
  Filled 2014-05-29: qty 250

## 2014-05-29 NOTE — Discharge Instructions (Signed)
You were seen today for a positive urine pregnancy test. US was unable to confirm an intrauterine pregnancy at this time. You need to return in 48 hours for a repeat Beta quant blood level. Please return if you have any vaginal bleeding or discharge. You are advised to avoid intercourse for 2 weeks as a precaution for ectopic pregnancy.

## 2014-05-29 NOTE — MAU Note (Signed)
Took an at home test yesterday and just wanted to confirm.  Denies any pain or bleeding.
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
## 2014-05-30 LAB — GC/CHLAMYDIA PROBE AMP (~~LOC~~) NOT AT ARMC
CHLAMYDIA, DNA PROBE: NEGATIVE
NEISSERIA GONORRHEA: NEGATIVE

## 2014-06-04 ENCOUNTER — Inpatient Hospital Stay (HOSPITAL_COMMUNITY)
Admission: AD | Admit: 2014-06-04 | Discharge: 2014-06-04 | Disposition: A | Discharge status: Home / Self Care | Payer: Medicaid Other | Source: Ambulatory Visit | Attending: Obstetrics & Gynecology | Admitting: Obstetrics & Gynecology

## 2014-06-04 ENCOUNTER — Inpatient Hospital Stay (HOSPITAL_COMMUNITY): Payer: Medicaid Other

## 2014-06-04 DIAGNOSIS — Z3A01 Less than 8 weeks gestation of pregnancy: Secondary | ICD-10-CM | POA: Insufficient documentation

## 2014-06-04 DIAGNOSIS — N912 Amenorrhea, unspecified: Secondary | ICD-10-CM

## 2014-06-04 DIAGNOSIS — Z09 Encounter for follow-up examination after completed treatment for conditions other than malignant neoplasm: Secondary | ICD-10-CM

## 2014-06-04 DIAGNOSIS — O3481 Maternal care for other abnormalities of pelvic organs, first trimester: Secondary | ICD-10-CM | POA: Insufficient documentation

## 2014-06-04 DIAGNOSIS — N831 Corpus luteum cyst: Secondary | ICD-10-CM | POA: Insufficient documentation

## 2014-06-04 DIAGNOSIS — O9989 Other specified diseases and conditions complicating pregnancy, childbirth and the puerperium: Secondary | ICD-10-CM | POA: Insufficient documentation

## 2014-06-04 DIAGNOSIS — Z3491 Encounter for supervision of normal pregnancy, unspecified, first trimester: Secondary | ICD-10-CM

## 2014-06-04 NOTE — Discharge Instructions (Signed)
Start your prenatal vitamins and prenatal care. Return here as needed for problems.

## 2014-06-04 NOTE — MAU Provider Note (Signed)
Anita Allen is a 24 y.o. female who returns to MAU for follow up. She was evaluated 1/19 and had an ultrasound that showed IUGS @ 5 weeks 2 days. She returns to day without any problems.   BP 126/75 mmHg  Pulse 86  Temp(Src) 98.2 F (36.8 C) (Oral)  Resp 16  LMP 04/24/2014 (Exact Date)  [place] Ob Transvaginal  06/04/2014   CLINICAL DATA:  Subsequent encounter for indeterminate ultrasound 6 days ago.  EXAM: TRANSVAGINAL OB ULTRASOUND  TECHNIQUE: Transvaginal ultrasound was performed for complete evaluation of the gestation as well as the maternal uterus, adnexal regions, and pelvic cul-de-sac.  COMPARISON:  05/29/14  FINDINGS: Intrauterine gestational sac: Single.  Yolk sac:  Present  Embryo:  Present  Cardiac Activity: Present  Heart Rate: 99 bpm  CRL:   2  mm   5 w 6 d                  US EDC: 01/29/2015  Maternal uterus/adnexae: Resolution of subchorionic hemorrhage. A 1.6 cm right ovarian corpus luteal cyst is identified. Normal appearance of the left ovary. Trace fluid, including adjacent the right ovary.  IMPRESSION: 1. Intrauterine gestation of 2 mm, corresponding to 5 weeks 6 days. 2. Resolution of subchorionic hemorrhage. 3. Right ovarian corpus luteal cyst. 4. Trace fluid, likely physiologic.   Electronically Signed   By: Jeronimo GreavesKyle  Talbot M.D.   On: 06/04/2014 16:31    I discussed with the patient ultrasound results and plan of care. She will start her prenatal vitamins and prenatal care. She will return here as needed for any problems.

## 2014-06-30 ENCOUNTER — Encounter (HOSPITAL_COMMUNITY): Payer: Self-pay | Admitting: *Deleted

## 2014-06-30 ENCOUNTER — Inpatient Hospital Stay (HOSPITAL_COMMUNITY)
Admission: AD | Admit: 2014-06-30 | Discharge: 2014-06-30 | Disposition: A | Discharge status: Home / Self Care | Payer: Self-pay | Source: Ambulatory Visit | Attending: Obstetrics & Gynecology | Admitting: Obstetrics & Gynecology

## 2014-06-30 DIAGNOSIS — M7918 Myalgia, other site: Secondary | ICD-10-CM

## 2014-06-30 DIAGNOSIS — O219 Vomiting of pregnancy, unspecified: Secondary | ICD-10-CM

## 2014-06-30 DIAGNOSIS — O26899 Other specified pregnancy related conditions, unspecified trimester: Secondary | ICD-10-CM

## 2014-06-30 DIAGNOSIS — Z3A09 9 weeks gestation of pregnancy: Secondary | ICD-10-CM | POA: Insufficient documentation

## 2014-06-30 DIAGNOSIS — O210 Mild hyperemesis gravidarum: Secondary | ICD-10-CM | POA: Insufficient documentation

## 2014-06-30 DIAGNOSIS — R1084 Generalized abdominal pain: Secondary | ICD-10-CM

## 2014-06-30 DIAGNOSIS — O9989 Other specified diseases and conditions complicating pregnancy, childbirth and the puerperium: Secondary | ICD-10-CM | POA: Insufficient documentation

## 2014-06-30 DIAGNOSIS — R109 Unspecified abdominal pain: Secondary | ICD-10-CM | POA: Insufficient documentation

## 2014-06-30 LAB — URINE MICROSCOPIC-ADD ON

## 2014-06-30 LAB — URINALYSIS, ROUTINE W REFLEX MICROSCOPIC
BILIRUBIN URINE: NEGATIVE
GLUCOSE, UA: NEGATIVE mg/dL
HGB URINE DIPSTICK: NEGATIVE
KETONES UR: NEGATIVE mg/dL
Nitrite: NEGATIVE
PROTEIN: NEGATIVE mg/dL
SPECIFIC GRAVITY, URINE: 1.025 (ref 1.005–1.030)
Urobilinogen, UA: 0.2 mg/dL (ref 0.0–1.0)
pH: 6.5 (ref 5.0–8.0)

## 2014-06-30 MED ORDER — METOCLOPRAMIDE HCL 10 MG PO TABS
20.0000 mg | ORAL_TABLET | Freq: Once | ORAL | Status: AC
  Administered 2014-06-30: 20 mg via ORAL
  Filled 2014-06-30: qty 2

## 2014-06-30 MED ORDER — FAMOTIDINE 20 MG PO TABS
20.0000 mg | ORAL_TABLET | Freq: Once | ORAL | Status: AC
  Administered 2014-06-30: 20 mg via ORAL
  Filled 2014-06-30: qty 1

## 2014-06-30 MED ORDER — PROMETHAZINE HCL 25 MG PO TABS: 25.0000 mg | ORAL_TABLET | Freq: Four times a day (QID) | ORAL | Status: DC | PRN

## 2014-06-30 MED ORDER — FAMOTIDINE 20 MG PO TABS: 20.0000 mg | ORAL_TABLET | Freq: Two times a day (BID) | ORAL | Status: DC

## 2014-06-30 MED ORDER — METOCLOPRAMIDE HCL 10 MG PO TABS: 20.0000 mg | ORAL_TABLET | Freq: Three times a day (TID) | ORAL | Status: DC

## 2014-06-30 NOTE — MAU Provider Note (Signed)
Chief Complaint: Abdominal Cramping   First Provider Initiated Contact with Patient 06/30/14 0144      SUBJECTIVE HPI: Anita Allen is a 24 y.o. G5P2012 at [redacted]w[redacted]d by 5 week 6 day ultrasound who presents with abdominal pain wrapping around from bilateral sides to umbilicus 2 days. Also reports nausea, vomiting and heartburn throughout the pregnancy. Vomits every time she eats. Can remember how many times she vomited the past 24 hours. Able to keep down fluids. No relief of GI complaints with Tums. Hasn't tried anything for the abdominal pain. Is worse with vomiting. No other aggravating or alleviating factors. Denies fever, chills, vaginal bleeding, diarrhea, constipation, urinary complaints.  Live SIUP per [place] 06/04/14. Wet prep and cultures negative. Treated empirically for PID.  Plans to get prenatal care with Dr. Gaynell Face after her pregnancy Medicaid is approved.  Past Medical History  Diagnosis Date  . Gonorrhea contact, treated   . Vaginal delivery 2012, 2013  . Molar pregnancy 12-2013   OB History  Gravida Para Term Preterm AB SAB TAB Ectopic Multiple Living  0 1 0 1 0 0 2    # Outcome Date GA Lbr Len/2nd Weight Sex Delivery Anes PTL Lv  5 Current           4 Term 02/24/12 [redacted]w[redacted]d / 01:03  M Vag-Spont EPI  Y  3 Term 2012 [redacted]w[redacted]d   F Vag-Spont   Y  2 TAB 2010 [redacted]w[redacted]d         1 Gravida              Complications: Molar pregnancy     Past Surgical History  Procedure Laterality Date  . No past surgeries    . Dilation and evacuation N/A 12/20/2013    Procedure: DILATATION AND EVACUATION;  Surgeon: Adam Phenix, MD;  Location: [facility];  Service: Gynecology;  Laterality: N/A;   History   Social History  . Marital Status: Single    Spouse Name: N/A  . Number of Children: N/A  . Years of Education: N/A   Occupational History  . Not on file.   Social History Main Topics  . Smoking status: Never Smoker   . Smokeless tobacco: Never Used  . Alcohol Use: No  . Drug Use: No   . Sexual Activity: Yes    Birth Control/ Protection: Condom, None   Other Topics Concern  . Not on file   Social History Narrative   No current facility-administered medications on file prior to encounter.   Current Outpatient Prescriptions on File Prior to Encounter  Medication Sig Dispense Refill  . metroNIDAZOLE (METROGEL) 0.75 % vaginal gel Place 1 Applicatorful vaginally 2 (two) times a week.     Allergies  Allergen Reactions  . Amoxicillin-Pot Clavulanate Hives    Childhood allergy  . Latex Rash    Vaginal irritation   Review of Systems  Constitutional: Negative for fever, chills and weight loss.  Gastrointestinal: Positive for heartburn, nausea, vomiting and abdominal pain. Negative for diarrhea and constipation.  Genitourinary: Negative for dysuria, urgency, frequency, hematuria and flank pain.       Negative for vaginal bleeding or vaginal discharge.   OBJECTIVE Blood pressure 122/71, pulse 89, temperature 99.5 F (37.5 C), resp. rate 18, height  (1.549 m), weight 72.576 kg (160 lb), last menstrual period 04/24/2014, SpO2 100 %, not currently breastfeeding. GENERAL: Well-developed, well-nourished female in no acute distress.  HEENT: Normocephalic. Mucous membranes moist. HEART: normal rate RESP: normal  effort ABDOMEN: Soft, mild left lower quadrant tenderness. Positive bowel sounds 4. Negative CVA tenderness. EXTREMITIES: Nontender, no edema NEURO: Alert and oriented SPECULUM EXAM: Deferred  LAB RESULTS Results for orders placed or performed during the hospital encounter of 06/30/14 (from the past 24 hour(s))  Urinalysis, Routine w reflex microscopic     Status: Abnormal   Collection Time: 06/30/14 12:58 AM  Result Value Ref Range   Color, Urine YELLOW YELLOW   APPearance CLEAR CLEAR   Specific Gravity, Urine 1.025 1.005 - 1.030   pH 6.5 5.0 - 8.0   Glucose, UA NEGATIVE NEGATIVE mg/dL   Hgb urine dipstick NEGATIVE NEGATIVE   Bilirubin Urine NEGATIVE  NEGATIVE   Ketones, ur NEGATIVE NEGATIVE mg/dL   Protein, ur NEGATIVE NEGATIVE mg/dL   Urobilinogen, UA 0.2 0.0 - 1.0 mg/dL   Nitrite NEGATIVE NEGATIVE   Leukocytes, UA SMALL (A) NEGATIVE  Urine microscopic-add on     Status: Abnormal   Collection Time: 06/30/14 12:58 AM  Result Value Ref Range   Squamous Epithelial / LPF RARE RARE   WBC, UA 3-6 <3 WBC/hpf   RBC / HPF 3-6 <3 RBC/hpf   Bacteria, UA MANY (A) RARE    IMAGING Bedside ultrasound shows live, single intrauterine pregnancy. Fetal pole 26 mm, corresponding with 9 weeks 3 days. Fetal heart rate 158. Positive fetal movement.  MAU COURSE Reglan and Pepcid given.  ASSESSMENT 1. Abdominal pain affecting pregnancy, antepartum   2. Generalized muscular abdominal pain-suspect pain is caused by repeated vomiting   3. Nausea and vomiting of pregnancy, antepartum    PLAN Discharge home in stable condition. Hyperemesis diet handout given. Comfort measures.     Follow-up Information    Follow up with Kathreen CosierMARSHALL,BERNARD A, MD.   Specialty:  Obstetrics and Gynecology   Why:  Start prenatal care   Contact information:   [address] [place] [id number] [phone]       [facility] with [facility].   Why:  As needed in emergencies   Contact information:   [address] [id number] mc [place] [id number] [phone]       Medication List    STOP taking these medications        calcium carbonate 1250 (500 CA) MG chewable tablet  Commonly known as:  OS-CAL      TAKE these medications        famotidine 20 MG tablet  Commonly known as:  PEPCID  Take 1 tablet (20 mg total) by mouth 2 (two) times daily.     metoCLOPramide 10 MG tablet  Commonly known as:  REGLAN  Take 2 tablets (20 mg total) by mouth 3 (three) times daily before meals.     metroNIDAZOLE 0.75 % vaginal gel  Commonly known as:  METROGEL  Place 1 Applicatorful vaginally  2 (two) times a week.     promethazine 25 MG tablet  Commonly known as:  PHENERGAN  Take 1 tablet (25 mg total) by mouth every 6 (six) hours as needed.         [place] Cacie Gaskins, CNM 06/30/2014  2:08 AM

## 2014-06-30 NOTE — Progress Notes (Signed)
Written and verbal d/c instructions given by Benji Stanley RN and understanding voiced. 

## 2014-06-30 NOTE — MAU Note (Signed)
abd cramping that comes from around sides to front of stomach. Some nausea and heartburn.

## 2014-06-30 NOTE — Discharge Instructions (Signed)
Abdominal Pain During Pregnancy Abdominal pain is common in pregnancy. Most of the time, it does not cause harm. There are many causes of abdominal pain. Some causes are more serious than others. Some of the causes of abdominal pain in pregnancy are easily diagnosed. Occasionally, the diagnosis takes time to understand. Other times, the cause is not determined. Abdominal pain can be a sign that something is very wrong with the pregnancy, or the pain may have nothing to do with the pregnancy at all. For this reason, always tell your health care provider if you have any abdominal discomfort. HOME CARE INSTRUCTIONS  Monitor your abdominal pain for any changes. The following actions may help to alleviate any discomfort you are experiencing:  Do not have sexual intercourse or put anything in your vagina until your symptoms go away completely.  Get plenty of rest until your pain improves.  Drink clear fluids if you feel nauseous. Avoid solid food as long as you are uncomfortable or nauseous.  Only take over-the-counter or prescription medicine as directed by your health care provider.  Keep all follow-up appointments with your health care provider. SEEK IMMEDIATE MEDICAL CARE IF:  You are bleeding, leaking fluid, or passing tissue from the vagina.  You have increasing pain or cramping.  You have persistent vomiting.  You have painful or bloody urination.  You have a fever.  You notice a decrease in your baby's movements.  You have extreme weakness or feel faint.  You have shortness of breath, with or without abdominal pain.  You develop a severe headache with abdominal pain.  You have abnormal vaginal discharge with abdominal pain.  You have persistent diarrhea.  You have abdominal pain that continues even after rest, or gets worse. MAKE SURE YOU:   Understand these instructions.  Will watch your condition.  Will get help right away if you are not doing well or get  worse. Document Released: 04/27/2005 Document Revised: 02/15/2013 Document Reviewed: 11/24/2012 [facility] Patient Information 2015 [place], [place]. This information is not intended to replace advice given to you by your health care provider. Make sure you discuss any questions you have with your health care provider.  Hyperemesis Gravidarum Hyperemesis gravidarum is a severe form of nausea and vomiting that happens during pregnancy. Hyperemesis is worse than morning sickness. It may cause you to have nausea or vomiting all day for many days. It may keep you from eating and drinking enough food and liquids. Hyperemesis usually occurs during the first half (the first 20 weeks) of pregnancy. It often goes away once a woman is in her second half of pregnancy. However, sometimes hyperemesis continues through an entire pregnancy.  CAUSES  The cause of this condition is not completely known but is thought to be related to changes in the body's hormones when pregnant. It could be from the high level of the pregnancy hormone or an increase in estrogen in the body.  SIGNS AND SYMPTOMS   Severe nausea and vomiting.  Nausea that does not go away.  Vomiting that does not allow you to keep any food down.  Weight loss and body fluid loss (dehydration).  Having no desire to eat or not liking food you have previously enjoyed. DIAGNOSIS  Your health care provider will do a physical exam and ask you about your symptoms. He or she may also order blood tests and urine tests to make sure something else is not causing the problem.  TREATMENT  You may only need medicine to control  the problem. If medicines do not control the nausea and vomiting, you will be treated in the hospital to prevent dehydration, increased acid in the blood (acidosis), weight loss, and changes in the electrolytes in your body that may harm the unborn baby (fetus). You may need IV fluids.  HOME CARE INSTRUCTIONS   Only take over-the-counter  or prescription medicines as directed by your health care provider.  Try eating a couple of dry crackers or toast in the morning before getting out of bed.  Avoid foods and smells that upset your stomach.  Avoid fatty and spicy foods.  Eat 5-6 small meals a day.  Do not drink when eating meals. Drink between meals.  For snacks, eat high-protein foods, such as cheese.  Eat or suck on things that have ginger in them. Ginger helps nausea.  Avoid food preparation. The smell of food can spoil your appetite.  Avoid iron pills and iron in your multivitamins until after 3-4 months of being pregnant. However, consult with your health care provider before stopping any prescribed iron pills. SEEK MEDICAL CARE IF:   Your abdominal pain increases.  You have a severe headache.  You have vision problems.  You are losing weight. SEEK IMMEDIATE MEDICAL CARE IF:   You are unable to keep fluids down.  You vomit blood.  You have constant nausea and vomiting.  You have excessive weakness.  You have extreme thirst.  You have dizziness or fainting.  You have a fever or persistent symptoms for more than 2-3 days.  You have a fever and your symptoms suddenly get worse. MAKE SURE YOU:   Understand these instructions.  Will watch your condition.  Will get help right away if you are not doing well or get worse. Document Released: 04/27/2005 Document Revised: 02/15/2013 Document Reviewed: 12/07/2012 [facility] Patient Information 2015 [place], [place]. This information is not intended to replace advice given to you by your health care provider. Make sure you discuss any questions you have with your health care provider.  Eating Plan for Hyperemesis Gravidarum Severe cases of hyperemesis gravidarum can lead to dehydration and malnutrition. The hyperemesis eating plan is one way to lessen the symptoms of nausea and vomiting. It is often used with prescribed medicines to control your symptoms.   WHAT CAN I DO TO RELIEVE MY SYMPTOMS? Listen to your body. Everyone is different and has different preferences. Find what works best for you. Some of the following things may help:  Eat and drink slowly.  Eat 5-6 small meals daily instead of 3 large meals.   Eat crackers before you get out of bed in the morning.   Starchy foods are usually well tolerated (such as cereal, toast, bread, potatoes, pasta, rice, and pretzels).   Ginger may help with nausea. Add  tsp ground ginger to hot tea or choose ginger tea.   Try drinking 100% fruit juice or an electrolyte drink.  Continue to take your prenatal vitamins as directed by your health care provider. If you are having trouble taking your prenatal vitamins, talk with your health care provider about different options.  Include at least 1 serving of protein with your meals and snacks (such as meats or poultry, beans, nuts, eggs, or yogurt). Try eating a protein-rich snack before bed (such as cheese and crackers or a half [place] or peanut butter sandwich). WHAT THINGS SHOULD I AVOID TO REDUCE MY SYMPTOMS? The following things may help reduce your symptoms:  Avoid foods with strong smells. Try eating  meals in well-ventilated areas that are free of odors.  Avoid drinking water or other beverages with meals. Try not to drink anything less than 30 minutes before and after meals.  Avoid drinking more than 1 cup of fluid at a time.  Avoid fried or high-fat foods, such as butter and cream sauces.  Avoid spicy foods.  Avoid skipping meals the best you can. Nausea can be more intense on an empty stomach. If you cannot tolerate food at that time, do not force it. Try sucking on ice chips or other frozen items and make up the calories later.  Avoid lying down within 2 hours after eating. Document Released: 02/22/2007 Document Revised: 05/02/2013 Document Reviewed: 03/01/2013 [facility] Patient Information 2015 [place], [place]. This information is  not intended to replace advice given to you by your health care provider. Make sure you discuss any questions you have with your health care provider.

## 2014-07-01 LAB — CULTURE, OB URINE
Colony Count: 35000
Special Requests: NORMAL

## 2014-07-28 ENCOUNTER — Inpatient Hospital Stay (HOSPITAL_COMMUNITY)
Admission: AD | Admit: 2014-07-28 | Discharge: 2014-07-28 | Disposition: A | Discharge status: Home / Self Care | Payer: Medicaid Other | Source: Ambulatory Visit | Attending: Obstetrics | Admitting: Obstetrics

## 2014-07-28 ENCOUNTER — Encounter (HOSPITAL_COMMUNITY): Payer: Self-pay | Admitting: *Deleted

## 2014-07-28 DIAGNOSIS — O26891 Other specified pregnancy related conditions, first trimester: Secondary | ICD-10-CM

## 2014-07-28 DIAGNOSIS — R109 Unspecified abdominal pain: Secondary | ICD-10-CM | POA: Insufficient documentation

## 2014-07-28 DIAGNOSIS — O9989 Other specified diseases and conditions complicating pregnancy, childbirth and the puerperium: Secondary | ICD-10-CM | POA: Diagnosis not present

## 2014-07-28 DIAGNOSIS — O26899 Other specified pregnancy related conditions, unspecified trimester: Secondary | ICD-10-CM

## 2014-07-28 DIAGNOSIS — R51 Headache: Secondary | ICD-10-CM | POA: Insufficient documentation

## 2014-07-28 DIAGNOSIS — R519 Headache, unspecified: Secondary | ICD-10-CM

## 2014-07-28 DIAGNOSIS — Z3A13 13 weeks gestation of pregnancy: Secondary | ICD-10-CM | POA: Diagnosis not present

## 2014-07-28 DIAGNOSIS — M549 Dorsalgia, unspecified: Secondary | ICD-10-CM | POA: Insufficient documentation

## 2014-07-28 LAB — URINALYSIS, ROUTINE W REFLEX MICROSCOPIC
Bilirubin Urine: NEGATIVE
Glucose, UA: NEGATIVE mg/dL
Ketones, ur: NEGATIVE mg/dL
Nitrite: NEGATIVE
PH: 6 (ref 5.0–8.0)
PROTEIN: NEGATIVE mg/dL
SPECIFIC GRAVITY, URINE: 1.02 (ref 1.005–1.030)
UROBILINOGEN UA: 0.2 mg/dL (ref 0.0–1.0)

## 2014-07-28 LAB — URINE MICROSCOPIC-ADD ON

## 2014-07-28 MED ORDER — CYCLOBENZAPRINE HCL 10 MG PO TABS: 10.0000 mg | ORAL_TABLET | Freq: Three times a day (TID) | ORAL | Status: DC | PRN

## 2014-07-28 NOTE — Discharge Instructions (Signed)
Abdominal Pain During Pregnancy °Abdominal pain is common in pregnancy. Most of the time, it does not cause harm. There are many causes of abdominal pain. Some causes are more serious than others. Some of the causes of abdominal pain in pregnancy are easily diagnosed. Occasionally, the diagnosis takes time to understand. Other times, the cause is not determined. Abdominal pain can be a sign that something is very wrong with the pregnancy, or the pain may have nothing to do with the pregnancy at all. For this reason, always tell your health care provider if you have any abdominal discomfort. °HOME CARE INSTRUCTIONS  °Monitor your abdominal pain for any changes. The following actions may help to alleviate any discomfort you are experiencing: °· Do not have sexual intercourse or put anything in your vagina until your symptoms go away completely. °· Get plenty of rest until your pain improves. °· Drink clear fluids if you feel nauseous. Avoid solid food as long as you are uncomfortable or nauseous. °· Only take over-the-counter or prescription medicine as directed by your health care provider. °· Keep all follow-up appointments with your health care provider. °SEEK IMMEDIATE MEDICAL CARE IF: °· You are bleeding, leaking fluid, or passing tissue from the vagina. °· You have increasing pain or cramping. °· You have persistent vomiting. °· You have painful or bloody urination. °· You have a fever. °· You notice a decrease in your baby's movements. °· You have extreme weakness or feel faint. °· You have shortness of breath, with or without abdominal pain. °· You develop a severe headache with abdominal pain. °· You have abnormal vaginal discharge with abdominal pain. °· You have persistent diarrhea. °· You have abdominal pain that continues even after rest, or gets worse. °MAKE SURE YOU:  °· Understand these instructions. °· Will watch your condition. °· Will get help right away if you are not doing well or get  worse. °Document Released: 04/27/2005 Document Revised: 02/15/2013 Document Reviewed: 11/24/2012 °ExitCare® Patient Information ©2015 [employer]. This information is not intended to replace advice given to you by your health care provider. Make sure you discuss any questions you have with your health care provider. °Back Pain in Pregnancy °Back pain during pregnancy is common. It happens in about half of all pregnancies. It is important for you and your baby that you remain active during your pregnancy. If you feel that back pain is not allowing you to remain active or sleep well, it is time to see your caregiver. Back pain may be caused by several factors related to changes during your pregnancy. Fortunately, unless you had trouble with your back before your pregnancy, the pain is likely to get better after you deliver. °Low back pain usually occurs between the fifth and seventh months of pregnancy. It can, however, happen in the first couple months. Factors that increase the risk of back problems include:  °· Previous back problems. °· Injury to your back. °· Having twins or multiple births. °· A chronic cough. °· Stress. °· Job-related repetitive motions. °· Muscle or spinal disease in the back. °· Family history of back problems, ruptured (herniated) discs, or osteoporosis. °· Depression, anxiety, and panic attacks. °CAUSES  °· When you are pregnant, your body produces a hormone called relaxin. This hormone makes the ligaments connecting the low back and pubic bones more flexible. This flexibility allows the baby to be delivered more easily. When your ligaments are loose, your muscles need to work harder to support your back. Soreness in your back can   come from tired muscles. Soreness can also come from back tissues that are irritated since they are receiving less support. °· As the baby grows, it puts pressure on the nerves and blood vessels in your pelvis. This can cause back pain. °· As the baby grows and  gets heavier during pregnancy, the uterus pushes the stomach muscles forward and changes your center of gravity. This makes your back muscles work harder to maintain good posture. °SYMPTOMS  °Lumbar pain during pregnancy °Lumbar pain during pregnancy usually occurs at or above the waist in the center of the back. There may be pain and numbness that radiates into your leg or foot. This is similar to low back pain experienced by non-pregnant women. It usually increases with sitting for long periods of time, standing, or repetitive lifting. Tenderness may also be present in the muscles along your upper back. °Posterior pelvic pain during pregnancy °Pain in the back of the pelvis is more common than lumbar pain in pregnancy. It is a deep pain felt in your side at the waistline, or across the tailbone (sacrum), or in both places. You may have pain on one or both sides. This pain can also go into the buttocks and backs of the upper thighs. Pubic and groin pain may also be present. The pain does not quickly resolve with rest, and morning stiffness may also be present. °Pelvic pain during pregnancy can be brought on by most activities. A high level of fitness before and during pregnancy may or may not prevent this problem. Labor pain is usually 1 to 2 minutes apart, lasts for about 1 minute, and involves a bearing down feeling or pressure in your pelvis. However, if you are at term with the pregnancy, constant low back pain can be the beginning of early labor, and you should be aware of this. °DIAGNOSIS  °X-rays of the back should not be done during the first 12 to 14 weeks of the pregnancy and only when absolutely necessary during the rest of the pregnancy. MRIs do not give off radiation and are safe during pregnancy. MRIs also should only be done when absolutely necessary. °HOME CARE INSTRUCTIONS °· Exercise as directed by your caregiver. Exercise is the most effective way to prevent or manage back pain. If you have a  back problem, it is especially important to avoid sports that require sudden body movements. Swimming and walking are great activities. °· Do not stand in one place for long periods of time. °· Do not wear high heels. °· Sit in chairs with good posture. Use a pillow on your lower back if necessary. Make sure your head rests over your shoulders and is not hanging forward. °· Try sleeping on your side, preferably the left side, with a pillow or two between your legs. If you are sore after a night's rest, your bed may be too soft. Try placing a board between your mattress and box spring. °· Listen to your body when lifting. If you are experiencing pain, ask for help or try bending your knees more so you can use your leg muscles rather than your back muscles. Squat down when picking up something from the floor. Do not bend over. °· Eat a healthy diet. Try to gain weight within your caregiver's recommendations. °· Use heat or cold packs 3 to 4 times a day for 15 minutes to help with the pain. °· Only take over-the-counter or prescription medicines for pain, discomfort, or fever as directed by your caregiver. °Sudden (acute) back pain °· Use   bed rest for only the most extreme, acute episodes of back pain. Prolonged bed rest over 48 hours will aggravate your condition. °· Ice is very effective for acute conditions. °¨ Put ice in a plastic bag. °¨ Place a towel between your skin and the bag. °¨ Leave the ice on for 10 to 20 minutes every 2 hours, or as needed. °· Using heat packs for 30 minutes prior to activities is also helpful. °Continued back pain °See your caregiver if you have continued problems. Your caregiver can help or refer you for appropriate physical therapy. With conditioning, most back problems can be avoided. Sometimes, a more serious issue may be the cause of back pain. You should be seen right away if new problems seem to be developing. Your caregiver may recommend: °· A maternity girdle. °· An elastic  sling. °· A back brace. °· A massage therapist or acupuncture. °SEEK MEDICAL CARE IF:  °· You are not able to do most of your daily activities, even when taking the pain medicine you were given. °· You need a referral to a physical therapist or chiropractor. °· You want to try acupuncture. °SEEK IMMEDIATE MEDICAL CARE IF: °· You develop numbness, tingling, weakness, or problems with the use of your arms or legs. °· You develop severe back pain that is no longer relieved with medicines. °· You have a sudden change in bowel or bladder control. °· You have increasing pain in other areas of the body. °· You develop shortness of breath, dizziness, or fainting. °· You develop nausea, vomiting, or sweating. °· You have back pain which is similar to labor pains. °· You have back pain along with your water breaking or vaginal bleeding. °· You have back pain or numbness that travels down your leg. °· Your back pain developed after you fell. °· You develop pain on one side of your back. You may have a kidney stone. °· You see blood in your urine. You may have a bladder infection or kidney stone. °· You have back pain with blisters. You may have shingles. °Back pain is fairly common during pregnancy but should not be accepted as just part of the process. Back pain should always be treated as soon as possible. This will make your pregnancy as pleasant as possible. °Document Released: 08/05/2005 Document Revised: 07/20/2011 Document Reviewed: 09/16/2010 °ExitCare® Patient Information ©2015 [employer]. This information is not intended to replace advice given to you by your health care provider. Make sure you discuss any questions you have with your health care provider. ° °

## 2014-07-28 NOTE — MAU Note (Signed)
Having bad pains in my stomach like when period is gonna start. Lower back pain. Bad headaches.Tylenol and Ibuprofen are not helping

## 2014-07-28 NOTE — MAU Provider Note (Signed)
History     CSN: [id number]  Arrival date and time: 07/28/14 4098   First Provider Initiated Contact with Patient 07/28/14 2019      Chief Complaint  Patient presents with  . Abdominal Pain  . Back Pain  . Headache   HPI Anita Allen 23 y.o. [id number]  presents to MAU complaining of back pain, abdominal pain and headache that have been increasing over the last week.  It feels like menstrual cramps, all over the abdomen and into the vagina.  The back pain is cramps that come and go also but not necessarily at the same time as abdominal cramping.  The headache is mild now but when it comes, it makes her feel unsteady.  There is associated nausea.  There is no vaginal bleeding, unusual discharge, LOF, fever, weakness, dysuria.   She last used Tylenol , , this am.   She declines to have STD testing.  She is driving herself and cannot use medications that cause sedation.    OB History    Gravida Para Term Preterm AB TAB SAB Ectopic Multiple Living   0 1 1 0 0 0 2      Past Medical History  Diagnosis Date  . No pertinent past medical history   . Gonorrhea contact, treated   . Vaginal delivery 2012, 2013  . Molar pregnancy 12-2013    Past Surgical History  Procedure Laterality Date  . No past surgeries    . Dilation and evacuation N/A 12/20/2013    Procedure: DILATATION AND EVACUATION;  Surgeon: Adam Phenix, MD;  Location: [facility];  Service: Gynecology;  Laterality: N/A;    Family History  Problem Relation Age of Onset  . Heart disease Father     History  Substance Use Topics  . Smoking status: Never Smoker   . Smokeless tobacco: Never Used  . Alcohol Use: No    Allergies:  Allergies  Allergen Reactions  . Amoxicillin-Pot Clavulanate Hives    Childhood allergy  . Latex Rash    Vaginal irritation    Prescriptions prior to admission  Medication Sig Dispense Refill Last Dose  . acetaminophen (TYLENOL) 325 MG tablet Take 650 mg by mouth every 6  (six) hours as needed.   07/28/2014 at Unknown time  . famotidine (PEPCID) 20 MG tablet Take 1 tablet (20 mg total) by mouth 2 (two) times daily. 30 tablet 6   . metoCLOPramide (REGLAN) 10 MG tablet Take 2 tablets (20 mg total) by mouth 3 (three) times daily before meals. 30 tablet 2   . metroNIDAZOLE (METROGEL) 0.75 % vaginal gel Place 1 Applicatorful vaginally 2 (two) times a week.   Past Week at Unknown time  . promethazine (PHENERGAN) 25 MG tablet Take 1 tablet (25 mg total) by mouth every 6 (six) hours as needed. 30 tablet 1     ROS Pertinent ROS in HPI  Physical Exam   Blood pressure 127/69, pulse 79, temperature 98.8 F (37.1 C), resp. rate 18, height  (1.549 m), weight 164 lb 9.6 oz (74.662 kg), last menstrual period 04/24/2014, unknown if currently breastfeeding.  Physical Exam  Constitutional: She is oriented to person, place, and time. She appears well-developed and well-nourished. No distress.  HENT:  Head: Normocephalic and atraumatic.  Eyes: EOM are normal.  Neck: Normal range of motion.  Cardiovascular: Normal rate, regular rhythm and normal heart sounds.   Respiratory: Effort normal and breath sounds normal. No respiratory distress.  GI:  Soft. Bowel sounds are normal. She exhibits no distension and no mass. There is no tenderness. There is no rebound and no guarding.  Musculoskeletal: Normal range of motion.  Neurological: She is alert and oriented to person, place, and time.  Skin: Skin is warm and dry.  Psychiatric: She has a normal mood and affect.    MAU Course  Procedures  MDM Unable to elicit pain on exam.  Pt appears well , without any signs of discomfort throughout our encounter.  Fetal heart tones heard on triage.  No vaginal bleeding.  Pt requires symptomatic treatment only.   Due to her status with driving, no medications prescribed in MAU.  [facility]    Assessment and Plan  A:   1. Abdominal pain in pregnancy   2.  Back pain affecting pregnancy in first trimester   3. Headache in pregnancy, first trimester     P: Discharge to home Flexeril rx for headache/muscle cramping Heat/ice prn Encourage good po hydration Urine cx pending [facility] Patient may return to MAU as needed or if her condition were to change or worsen   Bertram Denvereague Clark, Karen E 07/28/2014, 8:20 PM

## 2014-07-30 LAB — CULTURE, OB URINE

## 2014-09-08 ENCOUNTER — Inpatient Hospital Stay (HOSPITAL_COMMUNITY)
Admission: AD | Admit: 2014-09-08 | Discharge: 2014-09-08 | Disposition: A | Discharge status: Home / Self Care | Payer: Medicaid Other | Source: Ambulatory Visit | Attending: Obstetrics | Admitting: Obstetrics

## 2014-09-08 ENCOUNTER — Encounter (HOSPITAL_COMMUNITY): Payer: Self-pay | Admitting: *Deleted

## 2014-09-08 DIAGNOSIS — B373 Candidiasis of vulva and vagina: Secondary | ICD-10-CM | POA: Diagnosis not present

## 2014-09-08 DIAGNOSIS — Z3A20 20 weeks gestation of pregnancy: Secondary | ICD-10-CM

## 2014-09-08 DIAGNOSIS — B3731 Acute candidiasis of vulva and vagina: Secondary | ICD-10-CM

## 2014-09-08 DIAGNOSIS — O98812 Other maternal infectious and parasitic diseases complicating pregnancy, second trimester: Secondary | ICD-10-CM | POA: Insufficient documentation

## 2014-09-08 DIAGNOSIS — Z3A19 19 weeks gestation of pregnancy: Secondary | ICD-10-CM | POA: Diagnosis not present

## 2014-09-08 DIAGNOSIS — R102 Pelvic and perineal pain: Secondary | ICD-10-CM | POA: Diagnosis not present

## 2014-09-08 DIAGNOSIS — O2342 Unspecified infection of urinary tract in pregnancy, second trimester: Secondary | ICD-10-CM | POA: Insufficient documentation

## 2014-09-08 DIAGNOSIS — N949 Unspecified condition associated with female genital organs and menstrual cycle: Secondary | ICD-10-CM

## 2014-09-08 DIAGNOSIS — R109 Unspecified abdominal pain: Secondary | ICD-10-CM | POA: Diagnosis present

## 2014-09-08 LAB — CBC WITH DIFFERENTIAL/PLATELET
Basophils Absolute: 0 10*3/uL (ref 0.0–0.1)
Basophils Relative: 0 % (ref 0–1)
EOS ABS: 0.1 10*3/uL (ref 0.0–0.7)
EOS PCT: 1 % (ref 0–5)
HEMATOCRIT: 28.9 % — AB (ref 36.0–46.0)
Hemoglobin: 9.6 g/dL — ABNORMAL LOW (ref 12.0–15.0)
LYMPHS ABS: 1.4 10*3/uL (ref 0.7–4.0)
Lymphocytes Relative: 11 % — ABNORMAL LOW (ref 12–46)
MCH: 25.4 pg — ABNORMAL LOW (ref 26.0–34.0)
MCHC: 33.2 g/dL (ref 30.0–36.0)
MCV: 76.5 fL — AB (ref 78.0–100.0)
MONO ABS: 1 10*3/uL (ref 0.1–1.0)
MONOS PCT: 8 % (ref 3–12)
NEUTROS PCT: 80 % — AB (ref 43–77)
Neutro Abs: 10.1 10*3/uL — ABNORMAL HIGH (ref 1.7–7.7)
Platelets: 262 10*3/uL (ref 150–400)
RBC: 3.78 MIL/uL — ABNORMAL LOW (ref 3.87–5.11)
RDW: 18.7 % — ABNORMAL HIGH (ref 11.5–15.5)
WBC: 12.6 10*3/uL — ABNORMAL HIGH (ref 4.0–10.5)

## 2014-09-08 LAB — COMPREHENSIVE METABOLIC PANEL
ALBUMIN: 2.6 g/dL — AB (ref 3.5–5.2)
ALT: 14 U/L (ref 0–35)
ANION GAP: 4 — AB (ref 5–15)
AST: 14 U/L (ref 0–37)
Alkaline Phosphatase: 71 U/L (ref 39–117)
BUN: 6 mg/dL (ref 6–23)
CO2: 22 mmol/L (ref 19–32)
CREATININE: 0.43 mg/dL — AB (ref 0.50–1.10)
Calcium: 9.5 mg/dL (ref 8.4–10.5)
Chloride: 108 mmol/L (ref 96–112)
GFR calc non Af Amer: >90 mL/min (ref >90)
Glucose, Bld: 90 mg/dL (ref 70–99)
Potassium: 3.4 mmol/L — ABNORMAL LOW (ref 3.5–5.1)
Sodium: 134 mmol/L — ABNORMAL LOW (ref 135–145)
TOTAL PROTEIN: 5.8 g/dL — AB (ref 6.0–8.3)
Total Bilirubin: 0.3 mg/dL (ref 0.3–1.2)

## 2014-09-08 LAB — URINALYSIS, ROUTINE W REFLEX MICROSCOPIC
Bilirubin Urine: NEGATIVE
Glucose, UA: NEGATIVE mg/dL
KETONES UR: NEGATIVE mg/dL
Nitrite: NEGATIVE
PH: 6.5 (ref 5.0–8.0)
Protein, ur: 30 mg/dL — AB
Specific Gravity, Urine: 1.02 (ref 1.005–1.030)
Urobilinogen, UA: 0.2 mg/dL (ref 0.0–1.0)

## 2014-09-08 LAB — URINE MICROSCOPIC-ADD ON

## 2014-09-08 MED ORDER — OXYCODONE-ACETAMINOPHEN 5-325 MG PO TABS: 1.0000 | ORAL_TABLET | Freq: Four times a day (QID) | ORAL | Status: DC | PRN

## 2014-09-08 MED ORDER — NITROFURANTOIN MONOHYD MACRO 100 MG PO CAPS: 100.0000 mg | ORAL_CAPSULE | Freq: Two times a day (BID) | ORAL | Status: DC

## 2014-09-08 MED ORDER — OXYCODONE-ACETAMINOPHEN 5-325 MG PO TABS
1.0000 | ORAL_TABLET | Freq: Once | ORAL | Status: AC
  Administered 2014-09-08: 1 via ORAL
  Filled 2014-09-08: qty 1

## 2014-09-08 MED ORDER — FLUCONAZOLE 150 MG PO TABS
150.0000 mg | ORAL_TABLET | Freq: Once | ORAL | Status: AC
  Administered 2014-09-08: 150 mg via ORAL
  Filled 2014-09-08: qty 1

## 2014-09-08 MED ORDER — FLUCONAZOLE 150 MG PO TABS: 150.0000 mg | ORAL_TABLET | Freq: Every day | ORAL | Status: DC

## 2014-09-08 NOTE — MAU Note (Signed)
Patient states having abdominal cramping on the right side, back pain, and painful urination for last 3 days.  "I think I have a urinary tract infection."

## 2014-09-08 NOTE — MAU Note (Signed)
E Signature not working; pt signed printed copy.

## 2014-09-08 NOTE — MAU Provider Note (Signed)
History     CSN: [id number]  Arrival date and time: 09/08/14 2013   First Provider Initiated Contact with Patient 09/08/14 2038      Chief Complaint  Patient presents with  . Dysuria  . Abdominal Cramping   HPI  Anita Allen is a 24 y.o. [id number] at [redacted]w[redacted]d who presents to MAU today with complaint of abdominal cramping and dysuria. The patient states that she has had abdominal cramping for months and has been seen here and at Dr. Elsie Stain office for this previously. She was given Flexeril and Tylenol #3, neither of which are working. She last tried Tylenol #3 today without relief. She rates her pain at 8/10 now. She also complains of dysuria since last night. She endorses associated urgency and frequency of urination. She denies hematuria or fever, but has had mid and lower back pain. She states she was recently treated for BV and yeast in Dr. Elsie Stain office within the last month.   OB History    Gravida Para Term Preterm AB TAB SAB Ectopic Multiple Living   0 1 1 0 0 0 2      Past Medical History  Diagnosis Date  . No pertinent past medical history   . Gonorrhea contact, treated   . Vaginal delivery 2012, 2013  . Molar pregnancy 12-2013    Past Surgical History  Procedure Laterality Date  . No past surgeries    . Dilation and evacuation N/A 12/20/2013    Procedure: DILATATION AND EVACUATION;  Surgeon: Adam Phenix, MD;  Location: [facility];  Service: Gynecology;  Laterality: N/A;    Family History  Problem Relation Age of Onset  . Heart disease Father     History  Substance Use Topics  . Smoking status: Never Smoker   . Smokeless tobacco: Never Used  . Alcohol Use: No    Allergies:  Allergies  Allergen Reactions  . Amoxicillin-Pot Clavulanate Hives    Childhood allergy  . Latex Rash    Vaginal irritation    Prescriptions prior to admission  Medication Sig Dispense Refill Last Dose  . acetaminophen (TYLENOL) 325 MG tablet Take 650 mg by mouth  every 6 (six) hours as needed for mild pain.    Past Month at Unknown time  . acetaminophen-codeine (TYLENOL #3) 300-30 MG per tablet Take 2 tablets by mouth every 4 (four) hours as needed for moderate pain.   09/08/2014 at Unknown time  . cyclobenzaprine (FLEXERIL) 10 MG tablet Take 1 tablet (10 mg total) by mouth 3 (three) times daily as needed for muscle spasms. 20 tablet 0   . metoCLOPramide (REGLAN) 10 MG tablet Take 2 tablets (20 mg total) by mouth 3 (three) times daily before meals. 30 tablet 2   . promethazine (PHENERGAN) 25 MG tablet Take 1 tablet (25 mg total) by mouth every 6 (six) hours as needed. 30 tablet 1     Review of Systems  Constitutional: Positive for chills. Negative for fever and malaise/fatigue.  Gastrointestinal: Positive for abdominal pain. Negative for nausea, vomiting, diarrhea and constipation.  Genitourinary: Positive for dysuria, urgency and frequency. Negative for hematuria and flank pain.       Neg - vaginal bleeding, discharge   Physical Exam   Blood pressure 120/62, pulse 91, temperature 98.2 F (36.8 C), temperature source Oral, resp. rate 18, height  (1.549 m), weight 165 lb 12.8 oz (75.206 kg), last menstrual period 04/24/2014, unknown if currently breastfeeding.  Physical Exam  Nursing note and vitals reviewed. Constitutional: She is oriented to person, place, and time. She appears well-developed and well-nourished. No distress.  HENT:  Head: Normocephalic and atraumatic.  Cardiovascular: Normal rate.   Respiratory: Effort normal.  GI: Soft. She exhibits no distension and no mass. There is tenderness (mild tenderness to palpation of the RUQ and RLQ). There is no rebound, no guarding and no CVA tenderness.  Genitourinary: Uterus is enlarged (appropriate for GA). Cervix exhibits no motion tenderness. No bleeding in the vagina. Vaginal discharge (moderate amount of thick, clumpy, white discharge noted) found.  Neurological: She is alert and oriented  to person, place, and time.  Skin: Skin is warm and dry. No erythema.  Psychiatric: She has a normal mood and affect.  Dilation: Closed Effacement (%): Thick Cervical Position: Posterior Exam by:: Vonzella Nipple, PA-C  Results for orders placed or performed during the hospital encounter of 09/08/14 (from the past 24 hour(s))  Urinalysis, Routine w reflex microscopic     Status: Abnormal   Collection Time: 09/08/14  9:07 PM  Result Value Ref Range   Color, Urine YELLOW YELLOW   APPearance CLEAR CLEAR   Specific Gravity, Urine 1.020 1.005 - 1.030   pH 6.5 5.0 - 8.0   Glucose, UA NEGATIVE NEGATIVE mg/dL   Hgb urine dipstick LARGE (A) NEGATIVE   Bilirubin Urine NEGATIVE NEGATIVE   Ketones, ur NEGATIVE NEGATIVE mg/dL   Protein, ur 30 (A) NEGATIVE mg/dL   Urobilinogen, UA 0.2 0.0 - 1.0 mg/dL   Nitrite NEGATIVE NEGATIVE   Leukocytes, UA MODERATE (A) NEGATIVE  Urine microscopic-add on     Status: Abnormal   Collection Time: 09/08/14  9:07 PM  Result Value Ref Range   Squamous Epithelial / LPF FEW (A) RARE   WBC, UA 11-20 <3 WBC/hpf   RBC / HPF 21-50 <3 RBC/hpf   Bacteria, UA FEW (A) RARE  CBC with Differential/Platelet     Status: Abnormal   Collection Time: 09/08/14  9:15 PM  Result Value Ref Range   WBC 12.6 (H) 4.0 - 10.5 K/uL   RBC 3.78 (L) 3.87 - 5.11 MIL/uL   Hemoglobin 9.6 (L) 12.0 - 15.0 g/dL   HCT 16.1 (L) 09.6 - 04.5 %   MCV 76.5 (L) 78.0 - 100.0 fL   MCH 25.4 (L) 26.0 - 34.0 pg   MCHC 33.2 30.0 - 36.0 g/dL   RDW 40.9 (H) 81.1 - 91.4 %   Platelets 262 150 - 400 K/uL   Neutrophils Relative % 80 (H) 43 - 77 %   Neutro Abs 10.1 (H) 1.7 - 7.7 K/uL   Lymphocytes Relative 11 (L) 12 - 46 %   Lymphs Abs 1.4 0.7 - 4.0 K/uL   Monocytes Relative 8 3 - 12 %   Monocytes Absolute 1.0 0.1 - 1.0 K/uL   Eosinophils Relative 1 0 - 5 %   Eosinophils Absolute 0.1 0.0 - 0.7 K/uL   Basophils Relative 0 0 - 1 %   Basophils Absolute 0.0 0.0 - 0.1 K/uL  Comprehensive metabolic panel      Status: Abnormal   Collection Time: 09/08/14  9:15 PM  Result Value Ref Range   Sodium 134 (L) 135 - 145 mmol/L   Potassium 3.4 (L) 3.5 - 5.1 mmol/L   Chloride 108 96 - 112 mmol/L   CO2 22 19 - 32 mmol/L   Glucose, Bld 90 70 - 99 mg/dL   BUN 6 6 - 23 mg/dL   Creatinine, Ser 7.82 (  L) 0.50 - 1.10 mg/dL   Calcium 9.5 8.4 - 95.610.5 mg/dL   Total Protein 5.8 (L) 6.0 - 8.3 g/dL   Albumin 2.6 (L) 3.5 - 5.2 g/dL   AST 14 0 - 37 U/L   ALT 14 0 - 35 U/L   Alkaline Phosphatase 71 39 - 117 U/L   Total Bilirubin 0.3 0.3 - 1.2 mg/dL   GFR calc non Af Amer >90 >90 mL/min   GFR calc Af Amer >90 >90 mL/min   Anion gap 4 (L) 5 - 15    MAU Course  Procedures None  MDM FHR - 145 bpm with doppler UA, CBC, CMP, Amylase and Lipase today Percocet given in MAU for pain Diflucan 150 mg PO given in MAU for yeast Urine culture pending Amylase and Lipase pending at time of discharge. Low suspicion for abnormality given normal AST, ALT Assessment and Plan  A: SIUP at [id number] UTI in pregnancy, second trimester Yeast vulvovaginitis Round ligament pain Back pain in pregnancy, second trimester  P: Discharge home Rx for Percocet, Macrobid and Diflucan given to patient Second trimester and pyelonephritis warning signs discussed Urine culture pending Discussed use of abdominal binder PRN for round ligament and back pain Patient advised to discontinue Tylenol #3 while taking Percocet Patient advised to follow-up with Dr. Gaynell FaceMarshall as scheduled or sooner PRN Patient may return to MAU as needed or if her condition were to change or worsen  Marny LowensteinJulie N Wenzel, PA-C  09/08/2014, 10:13 PM

## 2014-09-08 NOTE — Discharge Instructions (Signed)
Candidal Vulvovaginitis Candidal vulvovaginitis is an infection of the vagina and vulva. The vulva is the skin around the opening of the vagina. This may cause itching and discomfort in and around the vagina.  HOME CARE  Only take medicine as told by your doctor.  Do not have sex (intercourse) until the infection is healed or as told by your doctor.  Practice safe sex.  Tell your sex partner about your infection.  Do not douche or use tampons.  Wear cotton underwear. Do not wear tight pants or panty hose.  Eat yogurt. This may help treat and prevent yeast infections. GET HELP RIGHT AWAY IF:   You have a fever.  Your problems get worse during treatment or do not get better in 3 days.  You have discomfort, irritation, or itching in your vagina or vulva area.  You have pain after sex.  You start to get belly (abdominal) pain. MAKE SURE YOU:  Understand these instructions.  Will watch your condition.  Will get help right away if you are not doing well or get worse. Document Released: 07/24/2008 Document Revised: 05/02/2013 Document Reviewed: 07/24/2008 [facility] Patient Information 2015 [place], [place]. This information is not intended to replace advice given to you by your health care provider. Make sure you discuss any questions you have with your health care provider. Pregnancy and Urinary Tract Infection A urinary tract infection (UTI) is a bacterial infection of the urinary tract. Infection of the urinary tract can include the ureters, kidneys (pyelonephritis), bladder (cystitis), and urethra (urethritis). All pregnant women should be screened for bacteria in the urinary tract. Identifying and treating a UTI will decrease the risk of preterm labor and developing more serious infections in both the mother and baby. CAUSES Bacteria germs cause almost all UTIs.  RISK FACTORS Many factors can increase your chances of getting a UTI during pregnancy. These include:  Having a  short urethra.  Poor toilet and hygiene habits.  Sexual intercourse.  Blockage of urine along the urinary tract.  Problems with the pelvic muscles or nerves.  Diabetes.  Obesity.  Bladder problems after having several children.  Previous history of UTI. SIGNS AND SYMPTOMS   Pain, burning, or a stinging feeling when urinating.  Suddenly feeling the need to urinate right away (urgency).  Loss of bladder control (urinary incontinence).  Frequent urination, more than is common with pregnancy.  Lower abdominal or back discomfort.  Cloudy urine.  Blood in the urine (hematuria).  Fever. When the kidneys are infected, the symptoms may be:  Back pain.  Flank pain on the right side more so than the left.  Fever.  Chills.  Nausea.  Vomiting. DIAGNOSIS  A urinary tract infection is usually diagnosed through urine tests. Additional tests and procedures are sometimes done. These may include:  Ultrasound exam of the kidneys, ureters, bladder, and urethra.  Looking in the bladder with a lighted tube (cystoscopy). TREATMENT Typically, UTIs can be treated with antibiotic medicines.  HOME CARE INSTRUCTIONS   Only take over-the-counter or prescription medicines as directed by your health care provider. If you were prescribed antibiotics, take them as directed. Finish them even if you start to feel better.  Drink enough fluids to keep your urine clear or pale yellow.  Do not have sexual intercourse until the infection is gone and your health care provider says it is okay.  Make sure you are tested for UTIs throughout your pregnancy. These infections often come back. Preventing a UTI in the Future  Practice good toilet habits. Always wipe from front to back. Use the tissue only once.  Do not hold your urine. Empty your bladder as soon as possible when the urge comes.  Do not douche or use deodorant sprays.  Wash with soap and warm water around the genital area and  the anus.  Empty your bladder before and after sexual intercourse.  Wear underwear with a cotton crotch.  Avoid caffeine and carbonated drinks. They can irritate the bladder.  Drink cranberry juice or take cranberry pills. This may decrease the risk of getting a UTI.  Do not drink alcohol.  Keep all your appointments and tests as scheduled. SEEK MEDICAL CARE IF:   Your symptoms get worse.  You are still having fevers 2 or more days after treatment begins.  You have a rash.  You feel that you are having problems with medicines prescribed.  You have abnormal vaginal discharge. SEEK IMMEDIATE MEDICAL CARE IF:   You have back or flank pain.  You have chills.  You have blood in your urine.  You have nausea and vomiting.  You have contractions of your uterus.  You have a gush of fluid from the vagina. MAKE SURE YOU:  Understand these instructions.   Will watch your condition.   Will get help right away if you are not doing well or get worse.  Document Released: 08/22/2010 Document Revised: 02/15/2013 Document Reviewed: 11/24/2012 [facility] Patient Information 2015 [place], [place]. This information is not intended to replace advice given to you by your health care provider. Make sure you discuss any questions you have with your health care provider.  Round Ligament Pain During Pregnancy   Round ligament pain is a sharp pain or jabbing feeling often felt in the lower belly or groin area on one or both sides. It is one of the most common complaints during pregnancy and is considered a normal part of pregnancy. It is most often felt during the second trimester.   Here is what you need to know about round ligament pain, including some tips to help you feel better.   Causes of Round Ligament Pain:    Several thick ligaments surround and support your womb (uterus) as it grows during pregnancy. One of them is called the round ligament.   The round ligament connects the  front part of the womb to your groin, the area where your legs attach to your pelvis. The round ligament normally tightens and relaxes slowly.   As your baby and womb grow, the round ligament stretches. That makes it more likely to become strained.   Sudden movements can cause the ligament to tighten quickly, like a rubber band snapping. This causes a sudden and quick jabbing feeling.   Symptoms of Round Ligament Pain   Round ligament pain can be concerning and uncomfortable. But it is considered normal as your body changes during pregnancy.   The symptoms of round ligament pain include a sharp, sudden spasm in the belly. It usually affects the right side, but it may happen on both sides. The pain only lasts a few seconds.   Exercise may cause the pain, as will rapid movements such as:   sneezing  coughing  laughing  rolling over in bed  standing up too quickly   Treatment of Round Ligament Pain   Here are some tips that may help reduce your discomfort:   Pain relief. Take over-the-counter acetaminophen for pain, if necessary. Ask your doctor if this is OK.  Exercise. Get plenty of exercise to keep your stomach (core) muscles strong. Doing stretching exercises or prenatal yoga can be helpful. Ask your doctor which exercises are safe for you and your baby.   A helpful exercise involves putting your hands and knees on the floor, lowering your head, and pushing your backside into the air.   Avoid sudden movements. Change positions slowly (such as standing up or sitting down) to avoid sudden movements that may cause stretching and pain.   Flex your hips. Bend and flex your hips before you cough, sneeze, or laugh to avoid pulling on the ligaments.   Apply warmth. A heating pad or warm bath may be helpful. Ask your doctor if this is OK. Extreme heat can be dangerous to the baby.   You should try to modify your daily activity level and avoid positions that may worsen the condition.    When to Call the Doctor/Midwife   Always tell your doctor or midwife about any type of pain you have during pregnancy. Round ligament pain is quick and doesn't last long.   Call your health care provider immediately if you have:   severe pain  fever  chills  pain on urination  difficulty walking   Belly pain during pregnancy can be due to many different causes. It is important for your doctor to rule out more serious conditions, including pregnancy complications such as placenta abruption or non-pregnancy illnesses such as:   inguinal hernia  appendicitis  stomach, liver, and kidney problems  Preterm labor pains may sometimes be mistaken for round ligament pain.

## 2014-09-09 LAB — AMYLASE: Amylase: 37 U/L (ref 0–105)

## 2014-09-09 LAB — LIPASE, BLOOD: LIPASE: 19 U/L (ref 11–59)

## 2014-09-11 LAB — CULTURE, OB URINE: Colony Count: >=100000

## 2014-09-12 ENCOUNTER — Telehealth: Payer: Self-pay | Admitting: Obstetrics and Gynecology

## 2014-09-12 NOTE — Telephone Encounter (Signed)
Urine culture resistant to Macrobid> needs keflex. Left message on VM.

## 2014-10-25 ENCOUNTER — Inpatient Hospital Stay (HOSPITAL_COMMUNITY)
Admission: AD | Admit: 2014-10-25 | Discharge: 2014-10-26 | Disposition: A | Discharge status: Home / Self Care | Payer: Medicaid Other | Source: Ambulatory Visit | Attending: Obstetrics | Admitting: Obstetrics

## 2014-10-25 ENCOUNTER — Encounter (HOSPITAL_COMMUNITY): Payer: Self-pay

## 2014-10-25 DIAGNOSIS — B379 Candidiasis, unspecified: Secondary | ICD-10-CM

## 2014-10-25 DIAGNOSIS — Z3A26 26 weeks gestation of pregnancy: Secondary | ICD-10-CM | POA: Diagnosis not present

## 2014-10-25 DIAGNOSIS — O98812 Other maternal infectious and parasitic diseases complicating pregnancy, second trimester: Secondary | ICD-10-CM | POA: Insufficient documentation

## 2014-10-25 DIAGNOSIS — B373 Candidiasis of vulva and vagina: Secondary | ICD-10-CM | POA: Insufficient documentation

## 2014-10-25 DIAGNOSIS — N760 Acute vaginitis: Secondary | ICD-10-CM | POA: Diagnosis not present

## 2014-10-25 DIAGNOSIS — B9689 Other specified bacterial agents as the cause of diseases classified elsewhere: Secondary | ICD-10-CM | POA: Insufficient documentation

## 2014-10-25 DIAGNOSIS — O23592 Infection of other part of genital tract in pregnancy, second trimester: Secondary | ICD-10-CM | POA: Insufficient documentation

## 2014-10-25 DIAGNOSIS — N898 Other specified noninflammatory disorders of vagina: Secondary | ICD-10-CM | POA: Diagnosis present

## 2014-10-25 NOTE — MAU Provider Note (Signed)
History     CSN: [id number]  Arrival date and time: 10/25/14 2205   First Provider Initiated Contact with Patient 10/25/14 2337      No chief complaint on file.  HPI Comments: She denies any contractions, LOF or vaginal bleeding. The fetus has been moving normally.   Vaginal Discharge The patient's primary symptoms include genital itching and vaginal discharge. This is a new problem. The current episode started 1 to 4 weeks ago (patient was given metrogel 3 weeks ago. She used two doses, and then started to to have swelling and increased vaginal discahgre. She stopped using it, but these symptoms have not stopped. ). The problem has been gradually worsening. The patient is experiencing no pain. She is pregnant. Pertinent negatives include no abdominal pain, constipation, diarrhea, dysuria, fever, frequency, nausea, urgency or vomiting. There has been no bleeding. Nothing aggravates the symptoms. She has tried nothing for the symptoms. She is sexually active. It is unknown whether or not her partner has an STD.    Past Medical History  Diagnosis Date  . No pertinent past medical history   . Gonorrhea contact, treated   . Vaginal delivery 2012, 2013  . Molar pregnancy 12-2013    Past Surgical History  Procedure Laterality Date  . No past surgeries    . Dilation and evacuation N/A 12/20/2013    Procedure: DILATATION AND EVACUATION;  Surgeon: Adam Phenix, MD;  Location: [facility];  Service: Gynecology;  Laterality: N/A;    Family History  Problem Relation Age of Onset  . Heart disease Father     History  Substance Use Topics  . Smoking status: Never Smoker   . Smokeless tobacco: Never Used  . Alcohol Use: No    Allergies:  Allergies  Allergen Reactions  . Amoxicillin-Pot Clavulanate Hives    Childhood allergy  . Latex Rash    Vaginal irritation    Prescriptions prior to admission  Medication Sig Dispense Refill Last Dose  . acetaminophen (TYLENOL) 325 MG tablet Take  650 mg by mouth every 6 (six) hours as needed for mild pain.    Past Month at Unknown time  . fluconazole (DIFLUCAN) 150 MG tablet Take 1 tablet (150 mg total) by mouth daily. 1 tablet 0   . metoCLOPramide (REGLAN) 10 MG tablet Take 2 tablets (20 mg total) by mouth 3 (three) times daily before meals. 30 tablet 2   . nitrofurantoin, macrocrystal-monohydrate, (MACROBID) 100 MG capsule Take 1 capsule (100 mg total) by mouth 2 (two) times daily. 10 capsule 0   . oxyCODONE-acetaminophen (PERCOCET/ROXICET) 5-325 MG per tablet Take 1 tablet by mouth every 6 (six) hours as needed for severe pain. 15 tablet 0   . promethazine (PHENERGAN) 25 MG tablet Take 1 tablet (25 mg total) by mouth every 6 (six) hours as needed. 30 tablet 1     Review of Systems  Constitutional: Negative for fever.  Gastrointestinal: Negative for nausea, vomiting, abdominal pain, diarrhea and constipation.  Genitourinary: Positive for vaginal discharge. Negative for dysuria, urgency and frequency.   Physical Exam   Blood pressure 128/65, pulse 97, temperature 98.5 F (36.9 C), resp. rate 22, weight 79.289 kg (174 lb 12.8 oz), last menstrual period 04/24/2014, SpO2 100 %, unknown if currently breastfeeding.  Physical Exam  Nursing note and vitals reviewed. Constitutional: She is oriented to person, place, and time. She appears well-developed. No distress.  HENT:  Head: Normocephalic.  Cardiovascular: Normal rate.   Respiratory: Effort normal.  GI: Soft.  There is no tenderness.  Genitourinary:   External: no lesion Vagina: large amount of thin white discharge, and thick white adherent discharge as well.  Cervix: pink, smooth, no CMT, closed/thick/high  Uterus: AGA   Neurological: She is alert and oriented to person, place, and time.  Skin: Skin is warm and dry.  Psychiatric: She has a normal mood and affect.   FHT: 150, moderate with 15x15 accels, no decels Toco: no UCs   Results for orders placed or performed  during the hospital encounter of 10/25/14 (from the past 24 hour(s))  Wet prep, genital     Status: Abnormal   Collection Time: 10/25/14 11:45 PM  Result Value Ref Range   Yeast Wet Prep HPF POC FEW (A) NONE SEEN   Trich, Wet Prep NONE SEEN NONE SEEN   Clue Cells Wet Prep HPF POC FEW (A) NONE SEEN   WBC, Wet Prep HPF POC MODERATE (A) NONE SEEN    MAU Course  Procedures  MDM   Assessment and Plan   1. Yeast infection   2. Bacterial vaginal infection   3. [redacted] weeks gestation of pregnancy    DC home Comfort measures reviewed  3rd Trimester precautions  PTL precautions  Fetal kick counts RX: flagyl and diflucan  Return to MAU as needed FU with OB as planned  Follow-up Information    Follow up with Kathreen Cosier, MD.   Specialty:  Obstetrics and Gynecology   Why:  As scheduled   Contact information:   [address] [place] [address] [phone]       Tawnya Crook 10/25/2014, 11:45 PM

## 2014-10-25 NOTE — MAU Note (Signed)
Pt diagnosed with BV, given medication that caused breakout.  Pt complains of burning, white discharge.Pt complaining of "popping" in lower abd area when lifting or bending.Denies NVD

## 2014-10-26 LAB — WET PREP, GENITAL: TRICH WET PREP: NONE SEEN

## 2014-10-26 LAB — GC/CHLAMYDIA PROBE AMP (~~LOC~~) NOT AT ARMC
CHLAMYDIA, DNA PROBE: NEGATIVE
NEISSERIA GONORRHEA: NEGATIVE

## 2014-10-26 MED ORDER — METRONIDAZOLE 500 MG PO TABS: 500.0000 mg | ORAL_TABLET | Freq: Two times a day (BID) | ORAL | Status: DC

## 2014-10-26 MED ORDER — FLUCONAZOLE 150 MG PO TABS
150.0000 mg | ORAL_TABLET | Freq: Once | ORAL | Status: AC
  Administered 2014-10-26: 150 mg via ORAL
  Filled 2014-10-26: qty 1

## 2014-10-26 MED ORDER — FLUCONAZOLE 150 MG PO TABS: 150.0000 mg | ORAL_TABLET | Freq: Once | ORAL | Status: DC

## 2014-10-26 NOTE — Discharge Instructions (Signed)
Monilial Vaginitis Vaginitis in a soreness, swelling and redness (inflammation) of the vagina and vulva. Monilial vaginitis is not a sexually transmitted infection. CAUSES  Yeast vaginitis is caused by yeast (candida) that is normally found in your vagina. With a yeast infection, the candida has overgrown in number to a point that upsets the chemical balance. SYMPTOMS   White, thick vaginal discharge.  Swelling, itching, redness and irritation of the vagina and possibly the lips of the vagina (vulva).  Burning or painful urination.  Painful intercourse. DIAGNOSIS  Things that may contribute to monilial vaginitis are:  Postmenopausal and virginal states.  Pregnancy.  Infections.  Being tired, sick or stressed, especially if you had monilial vaginitis in the past.  Diabetes. Good control will help lower the chance.  Birth control pills.  Tight fitting garments.  Using bubble bath, feminine sprays, douches or deodorant tampons.  Taking certain medications that kill germs (antibiotics).  Sporadic recurrence can occur if you become ill. TREATMENT  Your caregiver will give you medication.  There are several kinds of anti monilial vaginal creams and suppositories specific for monilial vaginitis. For recurrent yeast infections, use a suppository or cream in the vagina 2 times a week, or as directed.  Anti-monilial or steroid cream for the itching or irritation of the vulva may also be used. Get your caregiver's permission.  Painting the vagina with methylene blue solution may help if the monilial cream does not work.  Eating yogurt may help prevent monilial vaginitis. HOME CARE INSTRUCTIONS   Finish all medication as prescribed.  Do not have sex until treatment is completed or after your caregiver tells you it is okay.  Take warm sitz baths.  Do not douche.  Do not use tampons, especially scented ones.  Wear cotton underwear.  Avoid tight pants and panty  hose.  Tell your sexual partner that you have a yeast infection. They should go to their caregiver if they have symptoms such as mild rash or itching.  Your sexual partner should be treated as well if your infection is difficult to eliminate.  Practice safer sex. Use condoms.  Some vaginal medications cause latex condoms to fail. Vaginal medications that harm condoms are:  Cleocin cream.  Butoconazole (Femstat).  Terconazole (Terazol) vaginal suppository.  Miconazole (Monistat) (may be purchased over the counter). SEEK MEDICAL CARE IF:   You have a temperature by mouth above 102 F (38.9 C).  The infection is getting worse after 2 days of treatment.  The infection is not getting better after 3 days of treatment.  You develop blisters in or around your vagina.  You develop vaginal bleeding, and it is not your menstrual period.  You have pain when you urinate.  You develop intestinal problems.  You have pain with sexual intercourse. Document Released: 02/04/2005 Document Revised: 07/20/2011 Document Reviewed: 10/19/2008 Exit[employer] Patient Information 2015 [employer]. This information is not intended to replace advice given to you by your health care provider. Make sure you discuss any questions you have with your health care provider. Bacterial Vaginosis Bacterial vaginosis is a vaginal infection that occurs when the normal balance of bacteria in the vagina is disrupted. It results from an overgrowth of certain bacteria. This is the most common vaginal infection in women of childbearing age. Treatment is important to prevent complications, especially in pregnant women, as it can cause a premature delivery. CAUSES  Bacterial vaginosis is caused by an increase in harmful bacteria that are normally present in smaller amounts in the   vagina. Several different kinds of bacteria can cause bacterial vaginosis. However, the reason that the condition develops is not fully  understood. RISK FACTORS Certain activities or behaviors can put you at an increased risk of developing bacterial vaginosis, including:  Having a new sex partner or multiple sex partners.  Douching.  Using an intrauterine device (IUD) for contraception. Women do not get bacterial vaginosis from toilet seats, bedding, swimming pools, or contact with objects around them. SIGNS AND SYMPTOMS  Some women with bacterial vaginosis have no signs or symptoms. Common symptoms include:  Grey vaginal discharge.  A fishlike odor with discharge, especially after sexual intercourse.  Itching or burning of the vagina and vulva.  Burning or pain with urination. DIAGNOSIS  Your health care provider will take a medical history and examine the vagina for signs of bacterial vaginosis. A sample of vaginal fluid may be taken. Your health care provider will look at this sample under a microscope to check for bacteria and abnormal cells. A vaginal pH test may also be done.  TREATMENT  Bacterial vaginosis may be treated with antibiotic medicines. These may be given in the form of a pill or a vaginal cream. A second round of antibiotics may be prescribed if the condition comes back after treatment.  HOME CARE INSTRUCTIONS   Only take over-the-counter or prescription medicines as directed by your health care provider.  If antibiotic medicine was prescribed, take it as directed. Make sure you finish it even if you start to feel better.  Do not have sex until treatment is completed.  Tell all sexual partners that you have a vaginal infection. They should see their health care provider and be treated if they have problems, such as a mild rash or itching.  Practice safe sex by using condoms and only having one sex partner. SEEK MEDICAL CARE IF:   Your symptoms are not improving after 3 days of treatment.  You have increased discharge or pain.  You have a fever. MAKE SURE YOU:   Understand these  instructions.  Will watch your condition.  Will get help right away if you are not doing well or get worse. FOR MORE INFORMATION  Centers for Disease Control and Prevention, Division of STD Prevention: www.cdc.gov/std American Sexual Health Association (ASHA): www.ashastd.org  Document Released: 04/27/2005 Document Revised: 02/15/2013 Document Reviewed: 12/07/2012 ExitCare Patient Information 2015 [employer]. This information is not intended to replace advice given to you by your health care provider. Make sure you discuss any questions you have with your health care provider.  

## 2014-11-15 ENCOUNTER — Encounter (HOSPITAL_COMMUNITY): Payer: Self-pay | Admitting: *Deleted

## 2014-11-15 ENCOUNTER — Inpatient Hospital Stay (HOSPITAL_COMMUNITY)
Admission: AD | Admit: 2014-11-15 | Discharge: 2014-11-15 | Disposition: A | Discharge status: Home / Self Care | Payer: Medicaid Other | Source: Ambulatory Visit | Attending: Obstetrics | Admitting: Obstetrics

## 2014-11-15 DIAGNOSIS — R319 Hematuria, unspecified: Secondary | ICD-10-CM | POA: Diagnosis not present

## 2014-11-15 DIAGNOSIS — R42 Dizziness and giddiness: Secondary | ICD-10-CM | POA: Diagnosis present

## 2014-11-15 DIAGNOSIS — O212 Late vomiting of pregnancy: Secondary | ICD-10-CM | POA: Diagnosis not present

## 2014-11-15 DIAGNOSIS — Z3A29 29 weeks gestation of pregnancy: Secondary | ICD-10-CM | POA: Insufficient documentation

## 2014-11-15 DIAGNOSIS — O219 Vomiting of pregnancy, unspecified: Secondary | ICD-10-CM

## 2014-11-15 LAB — URINALYSIS, ROUTINE W REFLEX MICROSCOPIC
BILIRUBIN URINE: NEGATIVE
Glucose, UA: 100 mg/dL — AB
Ketones, ur: NEGATIVE mg/dL
Nitrite: NEGATIVE
PH: 5.5 (ref 5.0–8.0)
PROTEIN: NEGATIVE mg/dL
Specific Gravity, Urine: >1.03 — ABNORMAL HIGH (ref 1.005–1.030)
UROBILINOGEN UA: 0.2 mg/dL (ref 0.0–1.0)

## 2014-11-15 LAB — CBC WITH DIFFERENTIAL/PLATELET
BASOS ABS: 0 10*3/uL (ref 0.0–0.1)
Basophils Relative: 0 % (ref 0–1)
EOS ABS: 0.1 10*3/uL (ref 0.0–0.7)
Eosinophils Relative: 1 % (ref 0–5)
HEMATOCRIT: 29.8 % — AB (ref 36.0–46.0)
Hemoglobin: 9.5 g/dL — ABNORMAL LOW (ref 12.0–15.0)
LYMPHS PCT: 17 % (ref 12–46)
Lymphs Abs: 1.7 10*3/uL (ref 0.7–4.0)
MCH: 23.8 pg — ABNORMAL LOW (ref 26.0–34.0)
MCHC: 31.9 g/dL (ref 30.0–36.0)
MCV: 74.7 fL — ABNORMAL LOW (ref 78.0–100.0)
MONO ABS: 0.9 10*3/uL (ref 0.1–1.0)
Monocytes Relative: 9 % (ref 3–12)
NEUTROS PCT: 73 % (ref 43–77)
Neutro Abs: 7.3 10*3/uL (ref 1.7–7.7)
Platelets: 297 10*3/uL (ref 150–400)
RBC: 3.99 MIL/uL (ref 3.87–5.11)
RDW: 17.7 % — AB (ref 11.5–15.5)
WBC: 10 10*3/uL (ref 4.0–10.5)

## 2014-11-15 LAB — URINE MICROSCOPIC-ADD ON

## 2014-11-15 LAB — WET PREP, GENITAL
Trich, Wet Prep: NONE SEEN
YEAST WET PREP: NONE SEEN

## 2014-11-15 NOTE — Discharge Instructions (Signed)
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
## 2014-11-15 NOTE — MAU Note (Signed)
Pt stated she felt dizzy and SOB at work then felt a little nauseated. Pt went to the BR. Urinated and wiped  And had blood on the tissue. Called MD and told to come to MAU.

## 2014-11-17 LAB — CULTURE, OB URINE

## 2014-11-19 LAB — OB RESULTS CONSOLE ABO/RH: RH Type: POSITIVE

## 2014-11-19 LAB — OB RESULTS CONSOLE ANTIBODY SCREEN: ANTIBODY SCREEN: NEGATIVE

## 2014-11-19 LAB — OB RESULTS CONSOLE HEPATITIS B SURFACE ANTIGEN: Hepatitis B Surface Ag: NEGATIVE

## 2014-11-19 LAB — OB RESULTS CONSOLE GC/CHLAMYDIA
Chlamydia: NEGATIVE
Gonorrhea: NEGATIVE

## 2014-11-19 LAB — OB RESULTS CONSOLE RPR: RPR: NONREACTIVE

## 2014-11-19 LAB — OB RESULTS CONSOLE HIV ANTIBODY (ROUTINE TESTING): HIV: NONREACTIVE

## 2014-11-19 LAB — OB RESULTS CONSOLE RUBELLA ANTIBODY, IGM: Rubella: IMMUNE

## 2014-12-06 IMAGING — US US OB LIMITED
1 series · 13 of 28 positions shown · non-contrast
Comparison: None.

CLINICAL DATA: Vaginal bleeding.

EXAM:
US EARLY OB TRANSVAGINAL
TECHNIQUE: Transvaginal ultrasound was performed for complete evaluation of the
gestation as well as the maternal uterus, adnexal regions, and
pelvic cul-de-sac.

[Series 1: us ob limited · 13 of 32 slices shown]
[im 2/32]
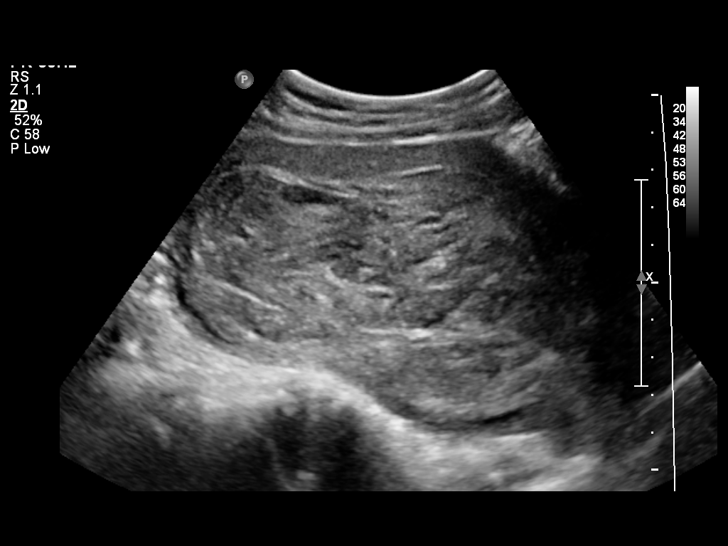
[im 4/32]
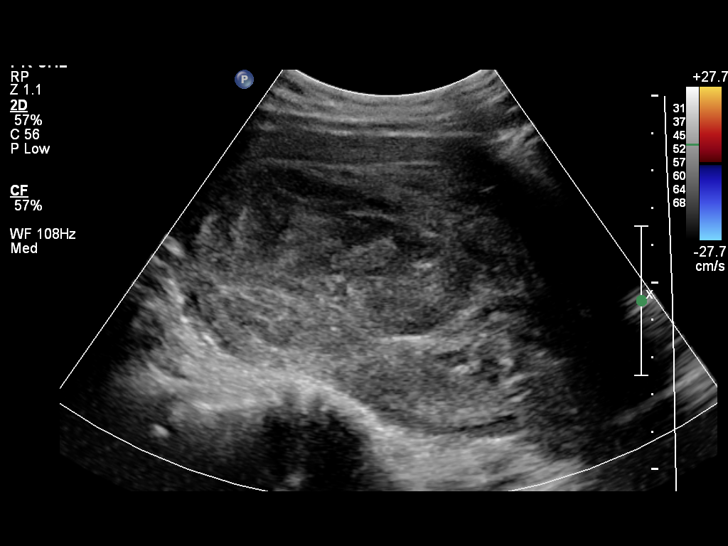
[im 6/32]
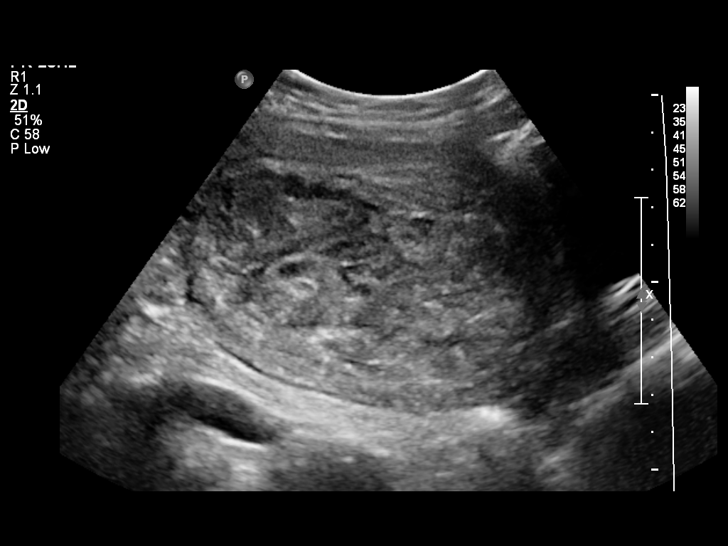
[im 9/32]
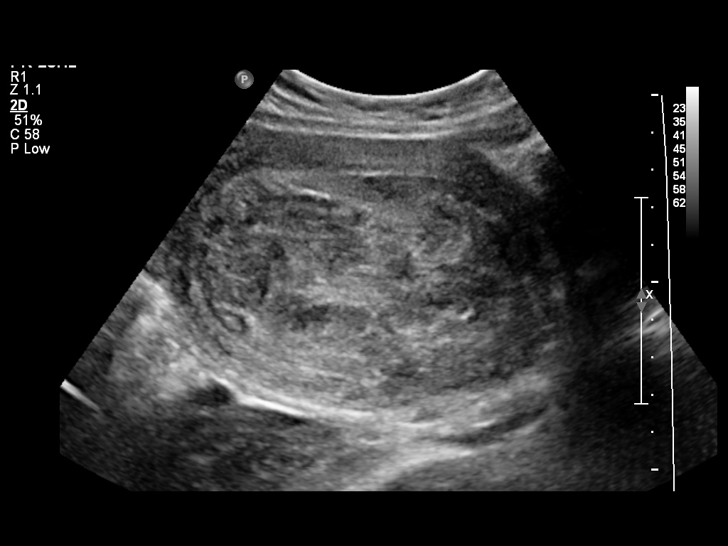
[im 11/32]
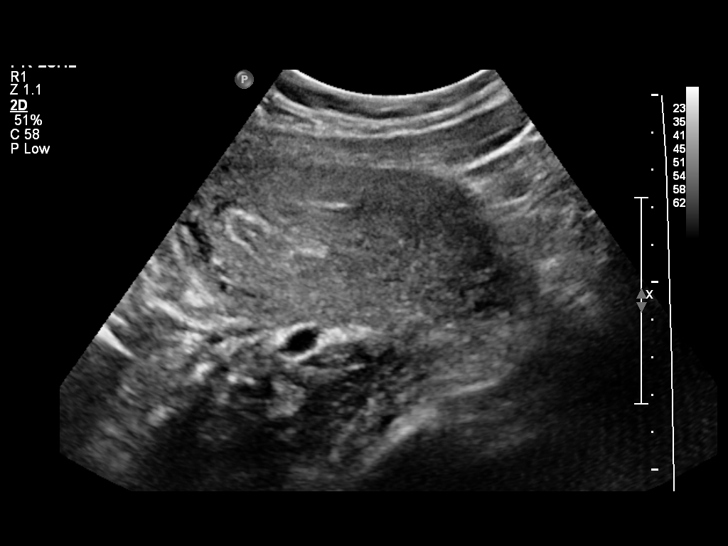
[im 13/32]
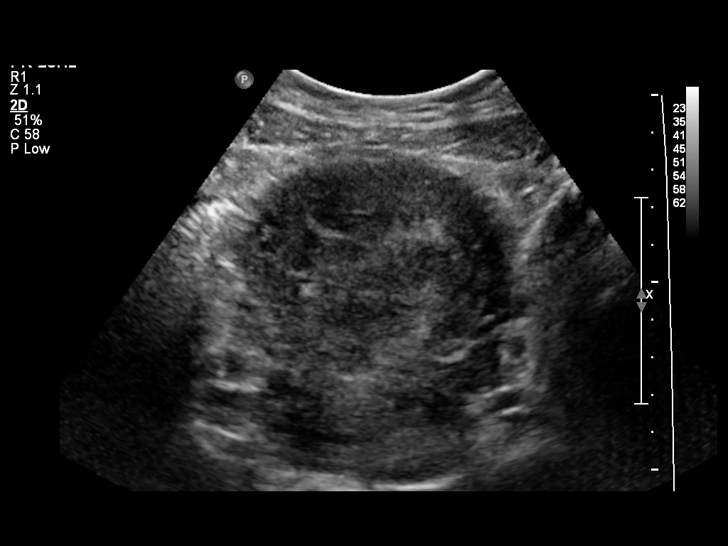
[im 17/32]
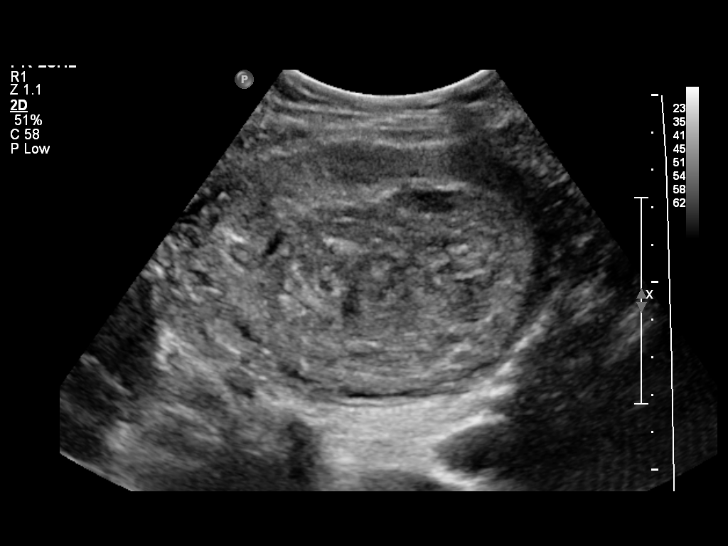
[im 19/32]
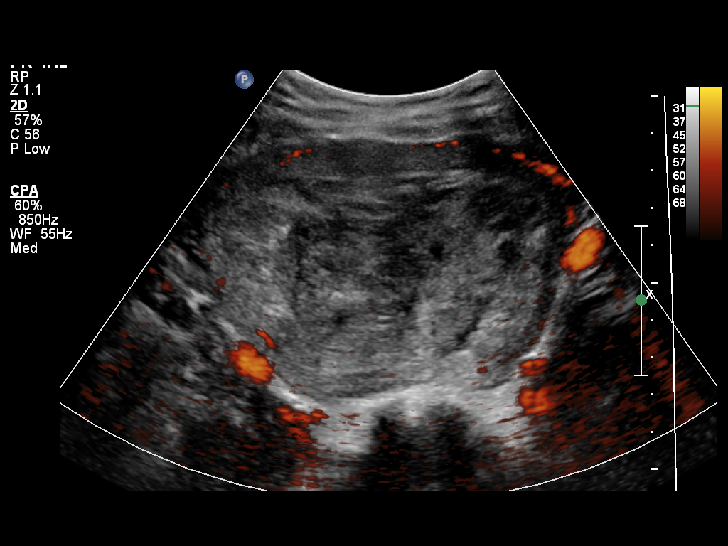
[im 21/32]
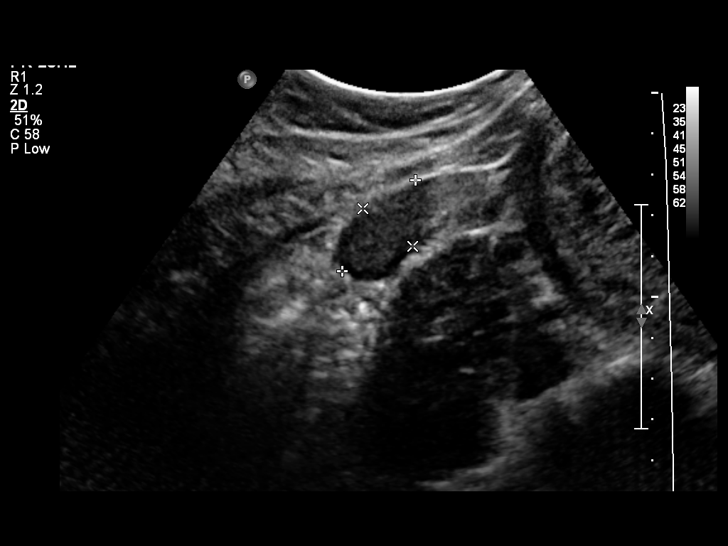
[im 23/32]
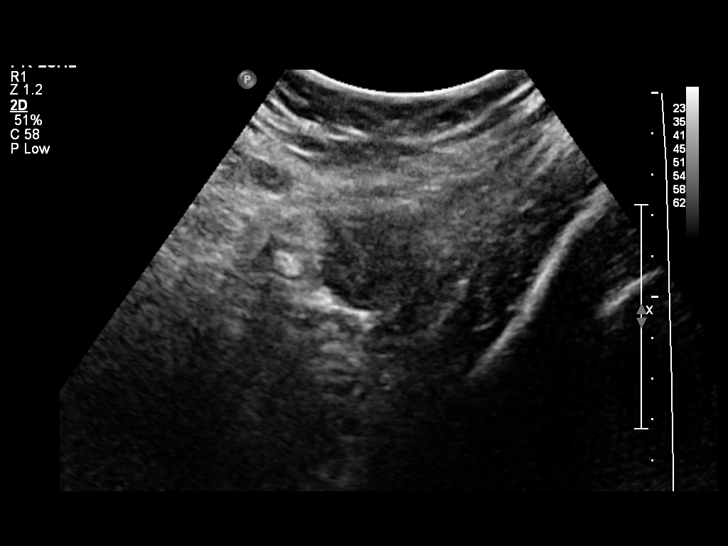
[im 26/32]
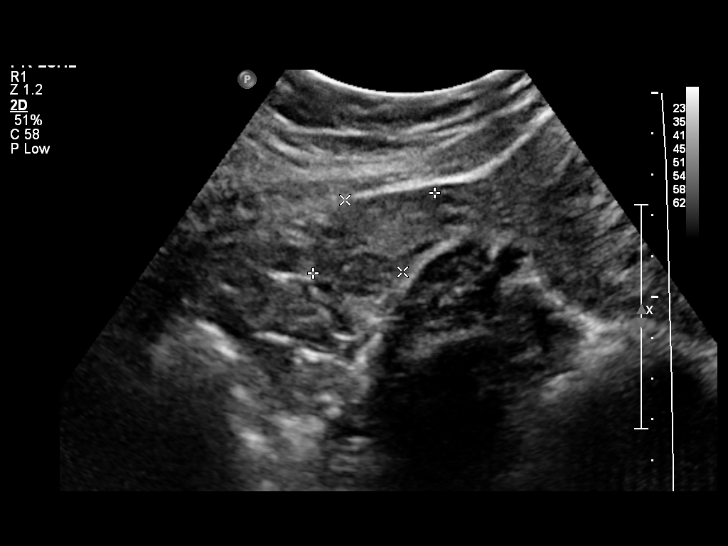
[im 28/32]
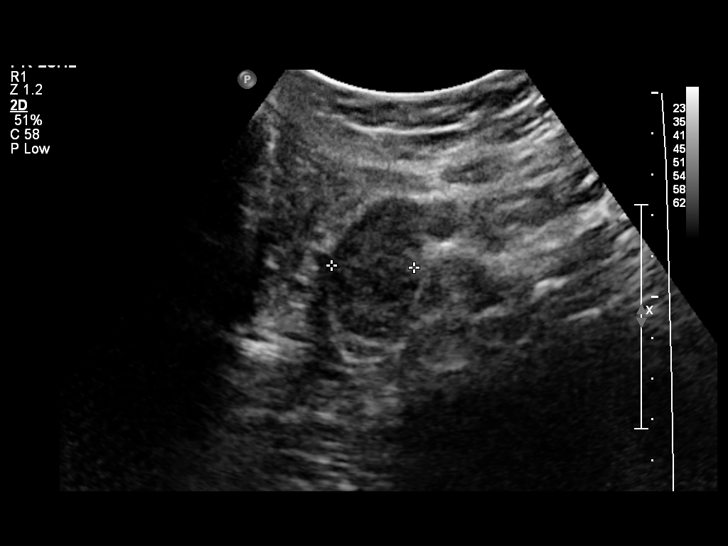
[im 30/32]
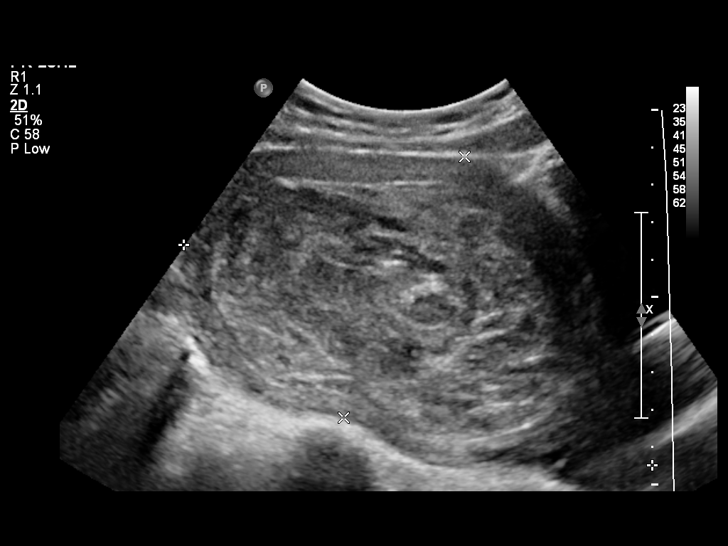

[13 of 28 positions shown; findings below may reference images not displayed]

FINDINGS: Intrauterine gestational sac:  None seen.

Yolk sac:  N/A

Embryo:  N/A

Maternal uterus/adnexae: There is massive distention of the
endometrial echo complex with a heterogeneous echogenic mass,
measuring approximately 9.8 x 6.0 x 7.5 cm. This is suspicious for a
partial molar pregnancy, given an apparent embryo identified on
prior ultrasound performed 11/14/2013. No associated blood flow is
seen, suggesting degeneration. Clot is considered less likely, as
prior vaginal bleeding would have been expected for this amount of
clot.

The ovaries are unremarkable in appearance. The right ovary measures
3.6 x 2.2 x 2.0 cm, while the left ovary measures 2.9 x 1.5 x
cm. No suspicious adnexal masses are seen; there is no evidence for
ovarian torsion.

No free fluid is seen within the pelvic cul-de-sac.
IMPRESSION: Very large heterogeneous echogenic mass noted filling the
endometrial echo complex, measuring 9.8 x 6.0 x 7.5 cm. This is
suspicious for a partial molar pregnancy.

These results were called by telephone at the time of interpretation
on 12/20/2013 at [DATE] to Gaona at the [HOSPITAL] [facility], who
verbally acknowledged these results.

## 2014-12-13 LAB — OB RESULTS CONSOLE GBS: STREP GROUP B AG: POSITIVE

## 2014-12-17 ENCOUNTER — Observation Stay (HOSPITAL_COMMUNITY)
Admission: AD | Admit: 2014-12-17 | Discharge: 2014-12-18 | Disposition: A | Discharge status: Home / Self Care | Payer: Medicaid Other | Source: Ambulatory Visit | Attending: Obstetrics | Admitting: Obstetrics

## 2014-12-17 ENCOUNTER — Encounter (HOSPITAL_COMMUNITY): Payer: Self-pay

## 2014-12-17 ENCOUNTER — Inpatient Hospital Stay (HOSPITAL_COMMUNITY): Payer: Medicaid Other

## 2014-12-17 DIAGNOSIS — O9A213 Injury, poisoning and certain other consequences of external causes complicating pregnancy, third trimester: Secondary | ICD-10-CM

## 2014-12-17 DIAGNOSIS — T1490XA Injury, unspecified, initial encounter: Secondary | ICD-10-CM

## 2014-12-17 DIAGNOSIS — T7491XA Unspecified adult maltreatment, confirmed, initial encounter: Secondary | ICD-10-CM

## 2014-12-17 DIAGNOSIS — S0591XA Unspecified injury of right eye and orbit, initial encounter: Secondary | ICD-10-CM | POA: Diagnosis not present

## 2014-12-17 DIAGNOSIS — O9989 Other specified diseases and conditions complicating pregnancy, childbirth and the puerperium: Secondary | ICD-10-CM | POA: Diagnosis present

## 2014-12-17 DIAGNOSIS — Z3A33 33 weeks gestation of pregnancy: Secondary | ICD-10-CM

## 2014-12-17 DIAGNOSIS — O9A313 Physical abuse complicating pregnancy, third trimester: Principal | ICD-10-CM | POA: Insufficient documentation

## 2014-12-17 LAB — URINALYSIS, ROUTINE W REFLEX MICROSCOPIC
Bilirubin Urine: NEGATIVE
Glucose, UA: NEGATIVE mg/dL
KETONES UR: 15 mg/dL — AB
NITRITE: NEGATIVE
PH: 5.5 (ref 5.0–8.0)
PROTEIN: NEGATIVE mg/dL
Specific Gravity, Urine: 1.025 (ref 1.005–1.030)
Urobilinogen, UA: 2 mg/dL — ABNORMAL HIGH (ref 0.0–1.0)

## 2014-12-17 LAB — CBC
HCT: 30.4 % — ABNORMAL LOW (ref 36.0–46.0)
Hemoglobin: 9.7 g/dL — ABNORMAL LOW (ref 12.0–15.0)
MCH: 23.9 pg — AB (ref 26.0–34.0)
MCHC: 31.9 g/dL (ref 30.0–36.0)
MCV: 74.9 fL — AB (ref 78.0–100.0)
Platelets: 316 10*3/uL (ref 150–400)
RBC: 4.06 MIL/uL (ref 3.87–5.11)
RDW: 19.7 % — ABNORMAL HIGH (ref 11.5–15.5)
WBC: 9.6 10*3/uL (ref 4.0–10.5)

## 2014-12-17 LAB — URINE MICROSCOPIC-ADD ON

## 2014-12-17 MED ORDER — PRENATAL MULTIVITAMIN CH
1.0000 | ORAL_TABLET | Freq: Every day | ORAL | Status: DC
  Administered 2014-12-18: 1 via ORAL
  Filled 2014-12-17: qty 1

## 2014-12-17 MED ORDER — OXYCODONE-ACETAMINOPHEN 5-325 MG PO TABS
2.0000 | ORAL_TABLET | Freq: Once | ORAL | Status: AC
  Administered 2014-12-17: 2 via ORAL
  Filled 2014-12-17: qty 2

## 2014-12-17 MED ORDER — CALCIUM CARBONATE ANTACID 500 MG PO CHEW: 2.0000 | CHEWABLE_TABLET | ORAL | Status: DC | PRN

## 2014-12-17 MED ORDER — HYDROCODONE-ACETAMINOPHEN 5-325 MG PO TABS: 1.0000 | ORAL_TABLET | Freq: Four times a day (QID) | ORAL | Status: DC | PRN

## 2014-12-17 MED ORDER — ACETAMINOPHEN 325 MG PO TABS: 650.0000 mg | ORAL_TABLET | ORAL | Status: DC | PRN

## 2014-12-17 MED ORDER — DOCUSATE SODIUM 100 MG PO CAPS
100.0000 mg | ORAL_CAPSULE | Freq: Every day | ORAL | Status: DC
  Administered 2014-12-18: 100 mg via ORAL
  Filled 2014-12-17: qty 1

## 2014-12-17 MED ORDER — ZOLPIDEM TARTRATE 5 MG PO TABS: 5.0000 mg | ORAL_TABLET | Freq: Every evening | ORAL | Status: DC | PRN
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
## 2014-12-17 NOTE — Progress Notes (Signed)
SANE nurse still at bedside with patient
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
## 2014-12-17 NOTE — Progress Notes (Signed)
SANE nurse in room with patient. 
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
## 2014-12-18 LAB — TYPE AND SCREEN
ABO/RH(D): A POS
ANTIBODY SCREEN: NEGATIVE

## 2014-12-18 NOTE — Progress Notes (Signed)
Patient is on the phone talking and laughing during my whole assessment.

## 2014-12-18 NOTE — Progress Notes (Signed)
Patient still talking on phone, she barely acknowledges me in the room.  I told her to get dressed and ready for discharge.
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
## 2014-12-18 NOTE — Discharge Instructions (Signed)
Discharge instructions   You can wash your hair  Shower  Eat what you want  Drink what you want  See me in 6 weeks  Your ankles are going to swell more in the next 2 weeks than when pregnant  No sex for 6 weeks   MARSHALL,BERNARD A, MD 12/18/2014   Assault, General Assault includes any behavior, whether intentional or reckless, which results in bodily injury to another person and/or damage to property. Included in this would be any behavior, intentional or reckless, that by its nature would be understood (interpreted) by a reasonable person as intent to harm another person or to damage his/her property. Threats may be oral or written. They may be communicated through regular mail, computer, fax, or phone. These threats may be direct or implied. FORMS OF ASSAULT INCLUDE:  Physically assaulting a person. This includes physical threats to inflict physical harm as well as:  Slapping.  Hitting.  Poking.  Kicking.  Punching.  Pushing.  Arson.  Sabotage.  Equipment vandalism.  Damaging or destroying property.  Throwing or hitting objects.  Displaying a weapon or an object that appears to be a weapon in a threatening manner.  Carrying a firearm of any kind.  Using a weapon to harm someone.  Using greater physical size/strength to intimidate another.  Making intimidating or threatening gestures.  Bullying.  Hazing.  Intimidating, threatening, hostile, or abusive language directed toward another person.  It communicates the intention to engage in violence against that person. And it leads a reasonable person to expect that violent behavior may occur.  Stalking another person. IF IT HAPPENS AGAIN: 7. Immediately call for emergency help (911 in [place] 8. If someone poses clear and immediate danger to you, seek legal authorities to have a protective or restraining order put in place. 9. Less threatening assaults can at least be reported to authorities. STEPS  TO TAKE IF A SEXUAL ASSAULT HAS HAPPENED  Go to an area of safety. This may include a shelter or staying with a friend. Stay away from the area where you have been attacked. A large percentage of sexual assaults are caused by a friend, relative or associate.  If medications were given by your caregiver, take them as directed for the full length of time prescribed.  Only take over-the-counter or prescription medicines for pain, discomfort, or fever as directed by your caregiver.  If you have come in contact with a sexual disease, find out if you are to be tested again. If your caregiver is concerned about the HIV/AIDS virus, he/she may require you to have continued testing for several months.  For the protection of your privacy, test results can not be given over the phone. Make sure you receive the results of your test. If your test results are not back during your visit, make an appointment with your caregiver to find out the results. Do not assume everything is normal if you have not heard from your caregiver or the medical facility. It is important for you to follow up on all of your test results.  File appropriate papers with authorities. This is important in all assaults, even if it has occurred in a family or by a friend. SEEK MEDICAL CARE IF:  You have new problems because of your injuries.  You have problems that may be because of the medicine you are taking, such as:  Rash.  Itching.  Swelling.  Trouble breathing.  You develop belly (abdominal) pain, feel sick to your stomach (nausea)  or are vomiting.  You begin to run a temperature.  You need supportive care or referral to a rape crisis center. These are centers with trained personnel who can help you get through this ordeal. SEEK IMMEDIATE MEDICAL CARE IF:  You are afraid of being threatened, beaten, or abused. In [place] call 911.  You receive new injuries related to abuse.  You develop severe pain in any area injured  in the assault or have any change in your condition that concerns you.  You faint or lose consciousness.  You develop chest pain or shortness of breath.   Preterm Labor Information Preterm labor is when labor starts at less than 37 weeks of pregnancy. The normal length of a pregnancy is 39 to 41 weeks. CAUSES Often, there is no identifiable underlying cause as to why a woman goes into preterm labor. One of the most common known causes of preterm labor is infection. Infections of the uterus, cervix, vagina, amniotic sac, bladder, kidney, or even the lungs (pneumonia) can cause labor to start. Other suspected causes of preterm labor include:   Urogenital infections, such as yeast infections and bacterial vaginosis.   Uterine abnormalities (uterine shape, uterine septum, fibroids, or bleeding from the placenta).   A cervix that has been operated on (it may fail to stay closed).   Malformations in the fetus.   Multiple gestations (twins, triplets, and so on).   Breakage of the amniotic sac.  RISK FACTORS 10. Having a previous history of preterm labor.  11. Having premature rupture of membranes (PROM).  12. Having a placenta that covers the opening of the cervix (placenta previa).  13. Having a placenta that separates from the uterus (placental abruption).  14. Having a cervix that is too weak to hold the fetus in the uterus (incompetent cervix).  15. Having too much fluid in the amniotic sac (polyhydramnios).  16. Taking illegal drugs or smoking while pregnant.  17. Not gaining enough weight while pregnant.  55. Being younger than 38 and older than 24 years old.  19. Having a low socioeconomic status.  20. Being African American. SYMPTOMS Signs and symptoms of preterm labor include:   Menstrual-like cramps, abdominal pain, or back pain.  Uterine contractions that are regular, as frequent as six in an hour, regardless of their intensity (may be mild or  painful).  Contractions that start on the top of the uterus and spread down to the lower abdomen and back.   A sense of increased pelvic pressure.   A watery or bloody mucus discharge that comes from the vagina.  TREATMENT Depending on the length of the pregnancy and other circumstances, your health care provider may suggest bed rest. If necessary, there are medicines that can be given to stop contractions and to mature the fetal lungs. If labor happens before 34 weeks of pregnancy, a prolonged hospital stay may be recommended. Treatment depends on the condition of both you and the fetus.  WHAT SHOULD YOU DO IF YOU THINK YOU ARE IN PRETERM LABOR? Call your health care provider right away. You will need to go to the hospital to get checked immediately. HOW CAN YOU PREVENT PRETERM LABOR IN FUTURE PREGNANCIES? You should:   Stop smoking if you smoke.  Maintain healthy weight gain and avoid chemicals and drugs that are not necessary.  Be watchful for any type of infection.  Inform your health care provider if you have a known history of preterm labor.  Fetal Movement Counts Patient Name:  __________________________________________________ Patient Due Date: ____________________ Performing a fetal movement count is highly recommended in high-risk pregnancies, but it is good for every pregnant woman to do. Your health care provider may ask you to start counting fetal movements at 28 weeks of the pregnancy. Fetal movements often increase:  After eating a full meal.  After physical activity.  After eating or drinking something sweet or cold.  At rest. Pay attention to when you feel the baby is most active. This will help you notice a pattern of your baby's sleep and wake cycles and what factors contribute to an increase in fetal movement. It is important to perform a fetal movement count at the same time each day when your baby is normally most active.  HOW TO COUNT FETAL  MOVEMENTS 21. Find a quiet and comfortable area to sit or lie down on your left side. Lying on your left side provides the best blood and oxygen circulation to your baby. 22. Write down the day and time on a sheet of paper or in a journal. 23. Start counting kicks, flutters, swishes, rolls, or jabs in a 2-hour period. You should feel at least 10 movements within 2 hours. 24. If you do not feel 10 movements in 2 hours, wait 2-3 hours and count again. Look for a change in the pattern or not enough counts in 2 hours. SEEK MEDICAL CARE IF:  You feel less than 10 counts in 2 hours, tried twice.  There is no movement in over an hour.  The pattern is changing or taking longer each day to reach 10 counts in 2 hours.  You feel the baby is not moving as he or she usually does. Date: ____________ Movements: ____________ Start time: ____________ Anita Allen time: ____________  Date: ____________ Movements: ____________ Start time: ____________ Anita Allen time: ____________ Date: ____________ Movements: ____________ Start time: ____________ Anita Allen time: ____________ Date: ____________ Movements: ____________ Start time: ____________ Anita Allen time: ____________ Date: ____________ Movements: ____________ Start time: ____________ Anita Allen time: ____________ Date: ____________ Movements: ____________ Start time: ____________ Anita Allen time: ____________ Date: ____________ Movements: ____________ Start time: ____________ Anita Allen time: ____________ Date: ____________ Movements: ____________ Start time: ____________ Anita Allen time: ____________  Date: ____________ Movements: ____________ Start time: ____________ Anita Allen time: ____________ Date: ____________ Movements: ____________ Start time: ____________ Anita Allen time: ____________ Date: ____________ Movements: ____________ Start time: ____________ Anita Allen time: ____________ Date: ____________ Movements: ____________ Start time: ____________ Anita Allen time: ____________ Date:  ____________ Movements: ____________ Start time: ____________ Anita Allen time: ____________ Date: ____________ Movements: ____________ Start time: ____________ Anita Allen time: ____________ Date: ____________ Movements: ____________ Start time: ____________ Anita Allen time: ____________  Date: ____________ Movements: ____________ Start time: ____________ Anita Allen time: ____________ Date: ____________ Movements: ____________ Start time: ____________ Anita Allen time: ____________ Date: ____________ Movements: ____________ Start time: ____________ Anita Allen time: ____________ Date: ____________ Movements: ____________ Start time: ____________ Anita Allen time: ____________ Date: ____________ Movements: ____________ Start time: ____________ Anita Allen time: ____________ Date: ____________ Movements: ____________ Start time: ____________ Anita Allen time: ____________ Date: ____________ Movements: ____________ Start time: ____________ Anita Allen time: ____________  Date: ____________ Movements: ____________ Start time: ____________ Anita Allen time: ____________ Date: ____________ Movements: ____________ Start time: ____________ Anita Allen time: ____________ Date: ____________ Movements: ____________ Start time: ____________ Anita Allen time: ____________ Date: ____________ Movements: ____________ Start time: ____________ Anita Allen time: ____________ Date: ____________ Movements: ____________ Start time: ____________ Anita Allen time: ____________ Date: ____________ Movements: ____________ Start time: ____________ Anita Allen time: ____________ Date: ____________ Movements: ____________ Start time: ____________ Anita Allen time: ____________  Date: ____________ Movements: ____________ Start time: ____________ Anita Allen time: ____________ Date: ____________ Movements:  ____________ Start time: ____________ Anita Allen time: ____________ Date: ____________ Movements: ____________ Start time: ____________ Anita Allen time: ____________ Date: ____________ Movements: ____________ Start  time: ____________ Anita Allen time: ____________ Date: ____________ Movements: ____________ Start time: ____________ Anita Allen time: ____________ Date: ____________ Movements: ____________ Start time: ____________ Anita Allen time: ____________ Date: ____________ Movements: ____________ Start time: ____________ Anita Allen time: ____________  Date: ____________ Movements: ____________ Start time: ____________ Anita Allen time: ____________ Date: ____________ Movements: ____________ Start time: ____________ Anita Allen time: ____________ Date: ____________ Movements: ____________ Start time: ____________ Anita Allen time: ____________ Date: ____________ Movements: ____________ Start time: ____________ Anita Allen time: ____________ Date: ____________ Movements: ____________ Start time: ____________ Anita Allen time: ____________ Date: ____________ Movements: ____________ Start time: ____________ Anita Allen time: ____________ Date: ____________ Movements: ____________ Start time: ____________ Anita Allen time: ____________  Date: ____________ Movements: ____________ Start time: ____________ Anita Allen time: ____________ Date: ____________ Movements: ____________ Start time: ____________ Anita Allen time: ____________ Date: ____________ Movements: ____________ Start time: ____________ Anita Allen time: ____________ Date: ____________ Movements: ____________ Start time: ____________ Anita Allen time: ____________ Date: ____________ Movements: ____________ Start time: ____________ Anita Allen time: ____________ Date: ____________ Movements: ____________ Start time: ____________ Anita Allen time: ____________ Date: ____________ Movements: ____________ Start time: ____________ Anita Allen time: ____________  Date: ____________ Movements: ____________ Start time: ____________ Anita Allen time: ____________ Date: ____________ Movements: ____________ Start time: ____________ Anita Allen time: ____________ Date: ____________ Movements: ____________ Start time: ____________ Anita Allen time:  ____________ Date: ____________ Movements: ____________ Start time: ____________ Anita Allen time: ____________ Date: ____________ Movements: ____________ Start time: ____________ Anita Allen time: ____________ Date: ____________ Movements: ____________ Start time: ____________ Anita Allen time: ____________

## 2014-12-26 ENCOUNTER — Inpatient Hospital Stay (HOSPITAL_COMMUNITY)
Admission: AD | Admit: 2014-12-26 | Discharge: 2014-12-26 | Disposition: A | Discharge status: Home / Self Care | Payer: Medicaid Other | Source: Ambulatory Visit | Attending: Obstetrics | Admitting: Obstetrics

## 2014-12-26 ENCOUNTER — Encounter (HOSPITAL_COMMUNITY): Payer: Self-pay | Admitting: *Deleted

## 2014-12-26 DIAGNOSIS — O23593 Infection of other part of genital tract in pregnancy, third trimester: Secondary | ICD-10-CM | POA: Insufficient documentation

## 2014-12-26 DIAGNOSIS — A499 Bacterial infection, unspecified: Secondary | ICD-10-CM

## 2014-12-26 DIAGNOSIS — B9689 Other specified bacterial agents as the cause of diseases classified elsewhere: Secondary | ICD-10-CM

## 2014-12-26 DIAGNOSIS — N760 Acute vaginitis: Secondary | ICD-10-CM | POA: Diagnosis not present

## 2014-12-26 DIAGNOSIS — Z3A35 35 weeks gestation of pregnancy: Secondary | ICD-10-CM | POA: Diagnosis not present

## 2014-12-26 DIAGNOSIS — N898 Other specified noninflammatory disorders of vagina: Secondary | ICD-10-CM | POA: Diagnosis present

## 2014-12-26 LAB — URINE MICROSCOPIC-ADD ON

## 2014-12-26 LAB — WET PREP, GENITAL
CLUE CELLS WET PREP: NONE SEEN
Trich, Wet Prep: NONE SEEN
Yeast Wet Prep HPF POC: NONE SEEN

## 2014-12-26 LAB — URINALYSIS, ROUTINE W REFLEX MICROSCOPIC
Bilirubin Urine: NEGATIVE
Glucose, UA: 100 mg/dL — AB
Ketones, ur: NEGATIVE mg/dL
Leukocytes, UA: NEGATIVE
Nitrite: NEGATIVE
Protein, ur: NEGATIVE mg/dL
SPECIFIC GRAVITY, URINE: 1.025 (ref 1.005–1.030)
Urobilinogen, UA: 1 mg/dL (ref 0.0–1.0)
pH: 6.5 (ref 5.0–8.0)

## 2014-12-26 MED ORDER — METRONIDAZOLE 500 MG PO TABS: 500.0000 mg | ORAL_TABLET | Freq: Two times a day (BID) | ORAL | Status: DC

## 2014-12-26 NOTE — MAU Note (Addendum)
Abdominal pain and cramping since this morning. Also having a strong odor and discharge. No bleeding or leaking. No other problems with her pregnancy.

## 2014-12-26 NOTE — MAU Provider Note (Signed)
History     CSN: [id number]  Arrival date and time: 12/26/14 2038   First Provider Initiated Contact with Patient 12/26/14 2235      Chief Complaint  Patient presents with  . Abdominal Pain  . Vaginal Discharge   HPI Comments: Anita Allen is a 24 y.o.  [phone] at [redacted]w[redacted]d who presents today with vaginal discharge, and irritation along perineum. She feels that she may have yeast and BV, due to the odor and itching present. She denies any VB or LOF, and states that the fetus has been active. She has an appointment with Dr. Gaynell Face tomorrow.   Vaginal Discharge The patient's primary symptoms include a genital rash and vaginal discharge. This is a new problem. The current episode started yesterday. The problem occurs constantly. The problem has been unchanged. The pain is mild (some cramping ). She is pregnant. Associated symptoms include abdominal pain. Pertinent negatives include no chills, constipation, diarrhea, dysuria, fever, frequency, nausea, urgency or vomiting. The vaginal discharge was brown, malodorous and thick. There has been no bleeding. She has not been passing clots. She has not been passing tissue. Nothing aggravates the symptoms. She has tried nothing for the symptoms.     Past Medical History  Diagnosis Date  . No pertinent past medical history   . Gonorrhea contact, treated   . Vaginal delivery 2012, 2013  . Molar pregnancy 12-2013    Past Surgical History  Procedure Laterality Date  . No past surgeries    . Dilation and evacuation N/A 12/20/2013    Procedure: DILATATION AND EVACUATION;  Surgeon: Adam Phenix, MD;  Location: [facility];  Service: Gynecology;  Laterality: N/A;    Family History  Problem Relation Age of Onset  . Heart disease Father     Social History  Substance Use Topics  . Smoking status: Never Smoker   . Smokeless tobacco: Never Used  . Alcohol Use: No    Allergies:  Allergies  Allergen Reactions  . Amoxicillin-Pot Clavulanate  Hives    Childhood allergy  . Latex Rash    Vaginal irritation    No prescriptions prior to admission    Review of Systems  Constitutional: Negative for fever and chills.  Gastrointestinal: Positive for abdominal pain. Negative for nausea, vomiting, diarrhea and constipation.  Genitourinary: Positive for vaginal discharge. Negative for dysuria, urgency and frequency.   Physical Exam   Blood pressure 113/61, pulse 87, temperature 98.5 F (36.9 C), temperature source Oral, resp. rate 18, height 5\' 1"  (1.549 m), weight 77.111 kg (170 lb), last menstrual period 04/24/2014, unknown if currently breastfeeding.  Physical Exam  Nursing note and vitals reviewed. Constitutional: She is oriented to person, place, and time. She appears well-developed and well-nourished.  HENT:  Head: Normocephalic.  Cardiovascular: Normal rate.   Respiratory: Effort normal.  GI: Soft. There is no tenderness. There is no rebound.  Neurological: She is alert and oriented to person, place, and time.  Skin: Skin is warm and dry.  Psychiatric: She has a normal mood and affect.   Results for orders placed or performed during the hospital encounter of 12/26/14 (from the past 24 hour(s))  Urinalysis, Routine w reflex microscopic (not at [facility])     Status: Abnormal   Collection Time: 12/26/14  8:47 PM  Result Value Ref Range   Color, Urine YELLOW YELLOW   APPearance CLEAR CLEAR   Specific Gravity, Urine 1.025 1.005 - 1.030   pH 6.5 5.0 - 8.0   Glucose, UA  100 (A) NEGATIVE mg/dL   Hgb urine dipstick TRACE (A) NEGATIVE   Bilirubin Urine NEGATIVE NEGATIVE   Ketones, ur NEGATIVE NEGATIVE mg/dL   Protein, ur NEGATIVE NEGATIVE mg/dL   Urobilinogen, UA 1.0 0.0 - 1.0 mg/dL   Nitrite NEGATIVE NEGATIVE   Leukocytes, UA NEGATIVE NEGATIVE  Urine microscopic-add on     Status: Abnormal   Collection Time: 12/26/14  8:47 PM  Result Value Ref Range   Squamous Epithelial / LPF FEW (A) RARE   WBC, UA 0-2 <3 WBC/hpf   RBC  / HPF 0-2 <3 RBC/hpf   Bacteria, UA FEW (A) RARE   Urine-Other MUCOUS PRESENT   Wet prep, genital     Status: Abnormal   Collection Time: 12/26/14  9:55 PM  Result Value Ref Range   Yeast Wet Prep HPF POC NONE SEEN NONE SEEN   Trich, Wet Prep NONE SEEN NONE SEEN   Clue Cells Wet Prep HPF POC NONE SEEN NONE SEEN   WBC, Wet Prep HPF POC FEW (A) NONE SEEN   FHT: 135, moderate with 15x15 accels, no decels Toco: no UCs  MAU Course  Procedures  MDM   Assessment and Plan   1. Bacterial vaginal infection   2. [redacted] weeks gestation of pregnancy    DC home Comfort measures reviewed  PTL precautions  Fetal kick counts RX: flagyl  #14 Return to MAU as needed FU with OB as planned  Follow-up Information    Follow up with Kathreen Cosier, MD.   Specialty:  Obstetrics and Gynecology   Why:  As scheduled   Contact information:   [address] [place] [address] [phone]         Tawnya Crook 12/26/2014, 10:36 PM

## 2014-12-26 NOTE — Discharge Instructions (Signed)
Bacterial Vaginosis Bacterial vaginosis is a vaginal infection that occurs when the normal balance of bacteria in the vagina is disrupted. It results from an overgrowth of certain bacteria. This is the most common vaginal infection in women of childbearing age. Treatment is important to prevent complications, especially in pregnant women, as it can cause a premature delivery. CAUSES  Bacterial vaginosis is caused by an increase in harmful bacteria that are normally present in smaller amounts in the vagina. Several different kinds of bacteria can cause bacterial vaginosis. However, the reason that the condition develops is not fully understood. RISK FACTORS Certain activities or behaviors can put you at an increased risk of developing bacterial vaginosis, including:  Having a new sex partner or multiple sex partners.  Douching.  Using an intrauterine device (IUD) for contraception. Women do not get bacterial vaginosis from toilet seats, bedding, swimming pools, or contact with objects around them. SIGNS AND SYMPTOMS  Some women with bacterial vaginosis have no signs or symptoms. Common symptoms include:  Grey vaginal discharge.  A fishlike odor with discharge, especially after sexual intercourse.  Itching or burning of the vagina and vulva.  Burning or pain with urination. DIAGNOSIS  Your health care provider will take a medical history and examine the vagina for signs of bacterial vaginosis. A sample of vaginal fluid may be taken. Your health care provider will look at this sample under a microscope to check for bacteria and abnormal cells. A vaginal pH test may also be done.  TREATMENT  Bacterial vaginosis may be treated with antibiotic medicines. These may be given in the form of a pill or a vaginal cream. A second round of antibiotics may be prescribed if the condition comes back after treatment.  HOME CARE INSTRUCTIONS   Only take over-the-counter or prescription medicines as  directed by your health care provider.  If antibiotic medicine was prescribed, take it as directed. Make sure you finish it even if you start to feel better.  Do not have sex until treatment is completed.  Tell all sexual partners that you have a vaginal infection. They should see their health care provider and be treated if they have problems, such as a mild rash or itching.  Practice safe sex by using condoms and only having one sex partner. SEEK MEDICAL CARE IF:   Your symptoms are not improving after 3 days of treatment.  You have increased discharge or pain.  You have a fever. MAKE SURE YOU:   Understand these instructions.  Will watch your condition.  Will get help right away if you are not doing well or get worse. FOR MORE INFORMATION  Centers for Disease Control and Prevention, Division of STD Prevention: www.cdc.gov/std American Sexual Health Association (ASHA): www.ashastd.org  Document Released: 04/27/2005 Document Revised: 02/15/2013 Document Reviewed: 12/07/2012 ExitCare Patient Information 2015 [employer]. This information is not intended to replace advice given to you by your health care provider. Make sure you discuss any questions you have with your health care provider.  

## 2014-12-27 LAB — GC/CHLAMYDIA PROBE AMP (~~LOC~~) NOT AT ARMC
Chlamydia: NEGATIVE
Neisseria Gonorrhea: NEGATIVE

## 2015-01-08 ENCOUNTER — Encounter (HOSPITAL_COMMUNITY): Payer: Self-pay | Admitting: *Deleted

## 2015-01-08 ENCOUNTER — Inpatient Hospital Stay (HOSPITAL_COMMUNITY)
Admission: AD | Admit: 2015-01-08 | Discharge: 2015-01-08 | Disposition: A | Discharge status: Home / Self Care | Payer: Medicaid Other | Source: Ambulatory Visit | Attending: Obstetrics | Admitting: Obstetrics

## 2015-01-08 DIAGNOSIS — O26893 Other specified pregnancy related conditions, third trimester: Secondary | ICD-10-CM | POA: Insufficient documentation

## 2015-01-08 DIAGNOSIS — M549 Dorsalgia, unspecified: Secondary | ICD-10-CM | POA: Insufficient documentation

## 2015-01-08 DIAGNOSIS — M25551 Pain in right hip: Secondary | ICD-10-CM | POA: Insufficient documentation

## 2015-01-08 DIAGNOSIS — O99891 Other specified diseases and conditions complicating pregnancy: Secondary | ICD-10-CM

## 2015-01-08 DIAGNOSIS — Z3A38 38 weeks gestation of pregnancy: Secondary | ICD-10-CM | POA: Insufficient documentation

## 2015-01-08 DIAGNOSIS — O9989 Other specified diseases and conditions complicating pregnancy, childbirth and the puerperium: Secondary | ICD-10-CM

## 2015-01-08 LAB — URINALYSIS, ROUTINE W REFLEX MICROSCOPIC
Bilirubin Urine: NEGATIVE
Glucose, UA: NEGATIVE mg/dL
Ketones, ur: NEGATIVE mg/dL
Nitrite: NEGATIVE
PROTEIN: NEGATIVE mg/dL
Specific Gravity, Urine: 1.015 (ref 1.005–1.030)
UROBILINOGEN UA: 2 mg/dL — AB (ref 0.0–1.0)
pH: 7 (ref 5.0–8.0)

## 2015-01-08 LAB — URINE MICROSCOPIC-ADD ON

## 2015-01-08 MED ORDER — CYCLOBENZAPRINE HCL 10 MG PO TABS
10.0000 mg | ORAL_TABLET | Freq: Once | ORAL | Status: AC
  Administered 2015-01-08: 10 mg via ORAL
  Filled 2015-01-08: qty 1

## 2015-01-08 MED ORDER — CYCLOBENZAPRINE HCL 5 MG PO TABS: 5.0000 mg | ORAL_TABLET | Freq: Three times a day (TID) | ORAL | Status: DC | PRN

## 2015-01-08 NOTE — Discharge Instructions (Signed)

## 2015-01-08 NOTE — MAU Note (Signed)
C/o  back pain  That has been throut the pregnancy but has increased this week; c/o abdominal pain that started today; c/o R leg pain for past 2 days (shooting pain down the whole R leg); c/o increased nausea and dizziness and headache today;

## 2015-01-08 NOTE — MAU Provider Note (Signed)
  History     CSN: [id number]  Arrival date and time: 01/08/15 1737   First Provider Initiated Contact with Patient 01/08/15 1912      Chief Complaint  Patient presents with  . Back Pain  . Sciatica  . Abdominal Pain  . Nausea  . Dizziness   HPI  Anita Allen 24 y.o. [id number] [redacted]w[redacted]d presents to MAU stating that she has been having sharp right sided hip pain for 3 days. She denies contractions, LOF, vaginal bleeding, reports positive fetal movement.   Past Medical History  Diagnosis Date  . No pertinent past medical history   . Gonorrhea contact, treated   . Vaginal delivery 2012, 2013  . Molar pregnancy 12-2013    Past Surgical History  Procedure Laterality Date  . No past surgeries    . Dilation and evacuation N/A 12/20/2013    Procedure: DILATATION AND EVACUATION;  Surgeon: Adam Phenix, MD;  Location: [facility];  Service: Gynecology;  Laterality: N/A;    Family History  Problem Relation Age of Onset  . Heart disease Father     Social History  Substance Use Topics  . Smoking status: Never Smoker   . Smokeless tobacco: Never Used  . Alcohol Use: No    Allergies:  Allergies  Allergen Reactions  . Amoxicillin-Pot Clavulanate Hives    Childhood allergy  . Latex Rash    Vaginal irritation    Prescriptions prior to admission  Medication Sig Dispense Refill Last Dose  . metroNIDAZOLE (FLAGYL) 500 MG tablet Take 1 tablet (500 mg total) by mouth 2 (two) times daily. (Patient not taking: Reported on 01/08/2015) 14 tablet 0 Not Taking at Unknown time    Review of Systems  Constitutional: Negative for fever.  Musculoskeletal: Positive for back pain.  All other systems reviewed and are negative.  Physical Exam   Blood pressure 114/62, pulse 93, temperature 98.8 F (37.1 C), temperature source Oral, resp. rate 18, last menstrual period 04/24/2014, unknown if currently breastfeeding.  Physical Exam  Nursing note and vitals reviewed. Constitutional: She is  oriented to person, place, and time. She appears well-developed and well-nourished. No distress.  HENT:  Head: Normocephalic and atraumatic.  Cardiovascular: Normal rate.   Respiratory: Effort normal and breath sounds normal. No respiratory distress.  GI: Soft.  Genitourinary: Vagina normal.  Musculoskeletal: She exhibits no tenderness.  Neurological: She is alert and oriented to person, place, and time.  Skin: Skin is warm and dry.  Psychiatric: She has a normal mood and affect. Her behavior is normal. Judgment and thought content normal.    MAU Course  Procedures  MDM Dilation: Closed (External 2 cm) Exam by:: Illene Bolus CNM Pt has reactive strip and likely has a irritated muscle /nerve related to the position of the baby. Will give flexeril and follow up with Dr Gaynell Face  Assessment and Plan  Back Pain  Flexeril 5 mg #15 Discharge to home  [facility] [place] 01/08/2015, 7:51 PM

## 2015-01-10 ENCOUNTER — Encounter (HOSPITAL_COMMUNITY): Payer: Self-pay | Admitting: *Deleted

## 2015-01-10 ENCOUNTER — Emergency Department (HOSPITAL_COMMUNITY): Payer: Medicaid Other

## 2015-01-10 ENCOUNTER — Inpatient Hospital Stay (HOSPITAL_COMMUNITY)
Admission: AD | Admit: 2015-01-10 | Discharge: 2015-01-10 | Disposition: A | Discharge status: Home / Self Care | Payer: Medicaid Other | Source: Ambulatory Visit | Attending: Obstetrics | Admitting: Obstetrics

## 2015-01-10 DIAGNOSIS — R202 Paresthesia of skin: Secondary | ICD-10-CM | POA: Diagnosis not present

## 2015-01-10 DIAGNOSIS — M542 Cervicalgia: Secondary | ICD-10-CM | POA: Diagnosis not present

## 2015-01-10 DIAGNOSIS — R200 Anesthesia of skin: Secondary | ICD-10-CM | POA: Insufficient documentation

## 2015-01-10 DIAGNOSIS — Z3493 Encounter for supervision of normal pregnancy, unspecified, third trimester: Secondary | ICD-10-CM

## 2015-01-10 DIAGNOSIS — O26893 Other specified pregnancy related conditions, third trimester: Secondary | ICD-10-CM | POA: Insufficient documentation

## 2015-01-10 DIAGNOSIS — Z3A38 38 weeks gestation of pregnancy: Secondary | ICD-10-CM | POA: Insufficient documentation

## 2015-01-10 DIAGNOSIS — IMO0002 Reserved for concepts with insufficient information to code with codable children: Secondary | ICD-10-CM

## 2015-01-10 HISTORY — DX: Other specified postprocedural states: Z98.890

## 2015-01-10 LAB — BASIC METABOLIC PANEL
ANION GAP: 7 (ref 5–15)
BUN: <5 mg/dL — ABNORMAL LOW (ref 6–20)
CALCIUM: 9.7 mg/dL (ref 8.9–10.3)
CO2: 19 mmol/L — ABNORMAL LOW (ref 22–32)
CREATININE: 0.41 mg/dL — AB (ref 0.44–1.00)
Chloride: 109 mmol/L (ref 101–111)
Glucose, Bld: 94 mg/dL (ref 65–99)
Potassium: 2.9 mmol/L — ABNORMAL LOW (ref 3.5–5.1)
SODIUM: 135 mmol/L (ref 135–145)

## 2015-01-10 LAB — CBC WITH DIFFERENTIAL/PLATELET
BASOS ABS: 0 10*3/uL (ref 0.0–0.1)
BASOS PCT: 0 % (ref 0–1)
EOS ABS: 0 10*3/uL (ref 0.0–0.7)
Eosinophils Relative: 0 % (ref 0–5)
HCT: 31.8 % — ABNORMAL LOW (ref 36.0–46.0)
HEMOGLOBIN: 10.2 g/dL — AB (ref 12.0–15.0)
Lymphocytes Relative: 12 % (ref 12–46)
Lymphs Abs: 1.8 10*3/uL (ref 0.7–4.0)
MCH: 23.5 pg — ABNORMAL LOW (ref 26.0–34.0)
MCHC: 32.1 g/dL (ref 30.0–36.0)
MCV: 73.3 fL — ABNORMAL LOW (ref 78.0–100.0)
MONOS PCT: 7 % (ref 3–12)
Monocytes Absolute: 1.1 10*3/uL — ABNORMAL HIGH (ref 0.1–1.0)
NEUTROS PCT: 81 % — AB (ref 43–77)
Neutro Abs: 12.4 10*3/uL — ABNORMAL HIGH (ref 1.7–7.7)
Platelets: 279 10*3/uL (ref 150–400)
RBC: 4.34 MIL/uL (ref 3.87–5.11)
RDW: 19.2 % — ABNORMAL HIGH (ref 11.5–15.5)
WBC: 15.4 10*3/uL — AB (ref 4.0–10.5)

## 2015-01-10 MED ORDER — POTASSIUM CHLORIDE CRYS ER 20 MEQ PO TBCR
40.0000 meq | EXTENDED_RELEASE_TABLET | Freq: Once | ORAL | Status: AC
  Administered 2015-01-10: 40 meq via ORAL
  Filled 2015-01-10: qty 2
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
## 2015-01-10 NOTE — Progress Notes (Signed)
[facility] at bedside to transfer to [facility]
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
## 2015-01-10 NOTE — Discharge Instructions (Signed)
Third Trimester of Pregnancy The third trimester is from week 29 through week 42, months 7 through 9. The third trimester is a time when the fetus is growing rapidly. At the end of the ninth month, the fetus is about 20 inches in length and weighs 6-10 pounds.  BODY CHANGES Your body goes through many changes during pregnancy. The changes vary from woman to woman.   Your weight will continue to increase. You can expect to gain 25-35 pounds (11-16 kg) by the end of the pregnancy.  You may begin to get stretch marks on your hips, abdomen, and breasts.  You may urinate more often because the fetus is moving lower into your pelvis and pressing on your bladder.  You may develop or continue to have heartburn as a result of your pregnancy.  You may develop constipation because certain hormones are causing the muscles that push waste through your intestines to slow down.  You may develop hemorrhoids or swollen, bulging veins (varicose veins).  You may have pelvic pain because of the weight gain and pregnancy hormones relaxing your joints between the bones in your pelvis. Backaches may result from overexertion of the muscles supporting your posture.  You may have changes in your hair. These can include thickening of your hair, rapid growth, and changes in texture. Some women also have hair loss during or after pregnancy, or hair that feels dry or thin. Your hair will most likely return to normal after your baby is born.  Your breasts will continue to grow and be tender. A yellow discharge may leak from your breasts called colostrum.  Your belly button may stick out.  You may feel short of breath because of your expanding uterus.  You may notice the fetus "dropping," or moving lower in your abdomen.  You may have a bloody mucus discharge. This usually occurs a few days to a week before labor begins.  Your cervix becomes thin and soft (effaced) near your due date. WHAT TO EXPECT AT YOUR PRENATAL  EXAMS  You will have prenatal exams every 2 weeks until week 36. Then, you will have weekly prenatal exams. During a routine prenatal visit:  You will be weighed to make sure you and the fetus are growing normally.  Your blood pressure is taken.  Your abdomen will be measured to track your baby's growth.  The fetal heartbeat will be listened to.  Any test results from the previous visit will be discussed.  You may have a cervical check near your due date to see if you have effaced. At around 36 weeks, your caregiver will check your cervix. At the same time, your caregiver will also perform a test on the secretions of the vaginal tissue. This test is to determine if a type of bacteria, Group B streptococcus, is present. Your caregiver will explain this further. Your caregiver may ask you:  What your birth plan is.  How you are feeling.  If you are feeling the baby move.  If you have had any abnormal symptoms, such as leaking fluid, bleeding, severe headaches, or abdominal cramping.  If you have any questions. Other tests or screenings that may be performed during your third trimester include:  Blood tests that check for low iron levels (anemia).  Fetal testing to check the health, activity level, and growth of the fetus. Testing is done if you have certain medical conditions or if there are problems during the pregnancy. FALSE LABOR You may feel small, irregular contractions that   eventually go away. These are called Braxton Hicks contractions, or false labor. Contractions may last for hours, days, or even weeks before true labor sets in. If contractions come at regular intervals, intensify, or become painful, it is best to be seen by your caregiver.  SIGNS OF LABOR   Menstrual-like cramps.  Contractions that are 5 minutes apart or less.  Contractions that start on the top of the uterus and spread down to the lower abdomen and back.  A sense of increased pelvic pressure or back  pain.  A watery or bloody mucus discharge that comes from the vagina. If you have any of these signs before the 37th week of pregnancy, call your caregiver right away. You need to go to the hospital to get checked immediately. HOME CARE INSTRUCTIONS   Avoid all smoking, herbs, alcohol, and unprescribed drugs. These chemicals affect the formation and growth of the baby.  Follow your caregiver's instructions regarding medicine use. There are medicines that are either safe or unsafe to take during pregnancy.  Exercise only as directed by your caregiver. Experiencing uterine cramps is a good sign to stop exercising.  Continue to eat regular, healthy meals.  Wear a good support bra for breast tenderness.  Do not use hot tubs, steam rooms, or saunas.  Wear your seat belt at all times when driving.  Avoid raw meat, uncooked cheese, cat litter boxes, and soil used by cats. These carry germs that can cause birth defects in the baby.  Take your prenatal vitamins.  Try taking a stool softener (if your caregiver approves) if you develop constipation. Eat more high-fiber foods, such as fresh vegetables or fruit and whole grains. Drink plenty of fluids to keep your urine clear or pale yellow.  Take warm sitz baths to soothe any pain or discomfort caused by hemorrhoids. Use hemorrhoid cream if your caregiver approves.  If you develop varicose veins, wear support hose. Elevate your feet for 15 minutes, 3-4 times a day. Limit salt in your diet.  Avoid heavy lifting, wear low heal shoes, and practice good posture.  Rest a lot with your legs elevated if you have leg cramps or low back pain.  Visit your dentist if you have not gone during your pregnancy. Use a soft toothbrush to brush your teeth and be gentle when you floss.  A sexual relationship may be continued unless your caregiver directs you otherwise.  Do not travel far distances unless it is absolutely necessary and only with the approval  of your caregiver.  Take prenatal classes to understand, practice, and ask questions about the labor and delivery.  Make a trial run to the hospital.  Pack your hospital bag.  Prepare the baby's nursery.  Continue to go to all your prenatal visits as directed by your caregiver. SEEK MEDICAL CARE IF:  You are unsure if you are in labor or if your water has broken.  You have dizziness.  You have mild pelvic cramps, pelvic pressure, or nagging pain in your abdominal area.  You have persistent nausea, vomiting, or diarrhea.  You have a bad smelling vaginal discharge.  You have pain with urination. SEEK IMMEDIATE MEDICAL CARE IF:   You have a fever.  You are leaking fluid from your vagina.  You have spotting or bleeding from your vagina.  You have severe abdominal cramping or pain.  You have rapid weight loss or gain.  You have shortness of breath with chest pain.  You notice sudden or extreme swelling   of your face, hands, ankles, feet, or legs.  You have not felt your baby move in over an hour.  You have severe headaches that do not go away with medicine.  You have vision changes. Document Released: 04/21/2001 Document Revised: 05/02/2013 Document Reviewed: 06/28/2012 ExitCare Patient Information 2015 [employer]. This information is not intended to replace advice given to you by your health care provider. Make sure you discuss any questions you have with your health care provider.  
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
## 2015-01-10 NOTE — Progress Notes (Signed)
Due to prolonged decel patient will be transferred to MAU for further monitoring at this time

## 2015-01-11 ENCOUNTER — Encounter (HOSPITAL_COMMUNITY): Payer: Self-pay | Admitting: *Deleted

## 2015-01-11 ENCOUNTER — Encounter (HOSPITAL_COMMUNITY): Payer: Self-pay

## 2015-01-15 ENCOUNTER — Telehealth (HOSPITAL_COMMUNITY): Payer: Self-pay | Admitting: *Deleted

## 2015-01-15 ENCOUNTER — Encounter (HOSPITAL_COMMUNITY): Payer: Self-pay | Admitting: *Deleted

## 2015-01-15 NOTE — Telephone Encounter (Signed)
Preadmission screen  

## 2015-01-16 ENCOUNTER — Telehealth (HOSPITAL_COMMUNITY): Payer: Self-pay | Admitting: Emergency Medicine

## 2015-01-17 ENCOUNTER — Inpatient Hospital Stay (HOSPITAL_COMMUNITY): Payer: Medicaid Other | Admitting: Anesthesiology

## 2015-01-17 ENCOUNTER — Inpatient Hospital Stay (HOSPITAL_COMMUNITY)
Admission: RE | Admit: 2015-01-17 | Discharge: 2015-01-19 | DRG: 775 | Disposition: A | Discharge status: Home / Self Care | Payer: Medicaid Other | Source: Ambulatory Visit | Attending: Obstetrics | Admitting: Obstetrics

## 2015-01-17 ENCOUNTER — Encounter (HOSPITAL_COMMUNITY): Payer: Self-pay

## 2015-01-17 DIAGNOSIS — Z3A39 39 weeks gestation of pregnancy: Secondary | ICD-10-CM | POA: Diagnosis present

## 2015-01-17 DIAGNOSIS — O99824 Streptococcus B carrier state complicating childbirth: Secondary | ICD-10-CM | POA: Diagnosis present

## 2015-01-17 DIAGNOSIS — O26893 Other specified pregnancy related conditions, third trimester: Secondary | ICD-10-CM | POA: Diagnosis present

## 2015-01-17 LAB — CBC
HEMATOCRIT: 31.1 % — AB (ref 36.0–46.0)
HEMOGLOBIN: 10.1 g/dL — AB (ref 12.0–15.0)
MCH: 23.5 pg — ABNORMAL LOW (ref 26.0–34.0)
MCHC: 32.5 g/dL (ref 30.0–36.0)
MCV: 72.3 fL — AB (ref 78.0–100.0)
Platelets: 331 10*3/uL (ref 150–400)
RBC: 4.3 MIL/uL (ref 3.87–5.11)
RDW: 19.7 % — ABNORMAL HIGH (ref 11.5–15.5)
WBC: 9.2 10*3/uL (ref 4.0–10.5)

## 2015-01-17 LAB — TYPE AND SCREEN
ABO/RH(D): A POS
ANTIBODY SCREEN: NEGATIVE

## 2015-01-17 LAB — RPR: RPR Ser Ql: NONREACTIVE

## 2015-01-17 MED ORDER — LIDOCAINE HCL (PF) 1 % IJ SOLN
INTRAMUSCULAR | Status: DC | PRN
  Administered 2015-01-17: 5 mL via EPIDURAL
  Administered 2015-01-17: 4 mL
  Administered 2015-01-17: 6 mL via EPIDURAL
  Administered 2015-01-17: 3 mL

## 2015-01-17 MED ORDER — BENZOCAINE-MENTHOL 20-0.5 % EX AERO: 1.0000 "application " | INHALATION_SPRAY | CUTANEOUS | Status: DC | PRN

## 2015-01-17 MED ORDER — CEFAZOLIN SODIUM-DEXTROSE 2-3 GM-% IV SOLR: 2.0000 g | Freq: Three times a day (TID) | INTRAVENOUS | Status: DC

## 2015-01-17 MED ORDER — OXYTOCIN 40 UNITS IN LACTATED RINGERS INFUSION - SIMPLE MED: 62.5000 mL/h | INTRAVENOUS | Status: DC

## 2015-01-17 MED ORDER — TETANUS-DIPHTH-ACELL PERTUSSIS 5-2.5-18.5 LF-MCG/0.5 IM SUSP
0.5000 mL | Freq: Once | INTRAMUSCULAR | Status: AC
  Administered 2015-01-19: 0.5 mL via INTRAMUSCULAR

## 2015-01-17 MED ORDER — ACETAMINOPHEN 325 MG PO TABS: 650.0000 mg | ORAL_TABLET | ORAL | Status: DC | PRN

## 2015-01-17 MED ORDER — FERROUS SULFATE 325 (65 FE) MG PO TABS
325.0000 mg | ORAL_TABLET | Freq: Two times a day (BID) | ORAL | Status: DC
  Administered 2015-01-18 – 2015-01-19 (×3): 325 mg via ORAL
  Filled 2015-01-17 (×3): qty 1

## 2015-01-17 MED ORDER — OXYCODONE-ACETAMINOPHEN 5-325 MG PO TABS: 1.0000 | ORAL_TABLET | ORAL | Status: DC | PRN

## 2015-01-17 MED ORDER — FENTANYL 2.5 MCG/ML BUPIVACAINE 1/10 % EPIDURAL INFUSION (WH - ANES)
14.0000 mL/h | INTRAMUSCULAR | Status: DC | PRN
Start: 2015-01-17 — End: 2015-01-17
  Administered 2015-01-17: 14 mL/h via EPIDURAL
  Filled 2015-01-17: qty 125

## 2015-01-17 MED ORDER — DIPHENHYDRAMINE HCL 50 MG/ML IJ SOLN
12.5000 mg | INTRAMUSCULAR | Status: DC | PRN
  Administered 2015-01-17: 12.5 mg via INTRAVENOUS
  Filled 2015-01-17: qty 1

## 2015-01-17 MED ORDER — SENNOSIDES-DOCUSATE SODIUM 8.6-50 MG PO TABS
2.0000 | ORAL_TABLET | ORAL | Status: DC
  Administered 2015-01-19: 2 via ORAL
  Filled 2015-01-17: qty 2

## 2015-01-17 MED ORDER — ONDANSETRON HCL 4 MG PO TABS: 4.0000 mg | ORAL_TABLET | ORAL | Status: DC | PRN

## 2015-01-17 MED ORDER — METHYLERGONOVINE MALEATE 0.2 MG PO TABS
0.2000 mg | ORAL_TABLET | ORAL | Status: AC
  Administered 2015-01-18 – 2015-01-19 (×6): 0.2 mg via ORAL
  Filled 2015-01-17 (×8): qty 1

## 2015-01-17 MED ORDER — LANOLIN HYDROUS EX OINT: TOPICAL_OINTMENT | CUTANEOUS | Status: DC | PRN

## 2015-01-17 MED ORDER — EPHEDRINE 5 MG/ML INJ
10.0000 mg | INTRAVENOUS | Status: DC | PRN
Start: 2015-01-17 — End: 2015-01-17
  Filled 2015-01-17: qty 2

## 2015-01-17 MED ORDER — PHENYLEPHRINE 40 MCG/ML (10ML) SYRINGE FOR IV PUSH (FOR BLOOD PRESSURE SUPPORT)
80.0000 ug | PREFILLED_SYRINGE | INTRAVENOUS | Status: DC | PRN
  Filled 2015-01-17: qty 20
  Filled 2015-01-17: qty 2

## 2015-01-17 MED ORDER — BUTORPHANOL TARTRATE 1 MG/ML IJ SOLN: 1.0000 mg | INTRAMUSCULAR | Status: DC | PRN

## 2015-01-17 MED ORDER — ONDANSETRON HCL 4 MG/2ML IJ SOLN: 4.0000 mg | INTRAMUSCULAR | Status: DC | PRN

## 2015-01-17 MED ORDER — OXYCODONE-ACETAMINOPHEN 5-325 MG PO TABS: 2.0000 | ORAL_TABLET | ORAL | Status: DC | PRN

## 2015-01-17 MED ORDER — LIDOCAINE-EPINEPHRINE (PF) 2 %-1:200000 IJ SOLN
INTRAMUSCULAR | Status: DC | PRN
  Administered 2015-01-17: 5 mL via EPIDURAL

## 2015-01-17 MED ORDER — OXYTOCIN 40 UNITS IN LACTATED RINGERS INFUSION - SIMPLE MED
1.0000 m[IU]/min | INTRAVENOUS | Status: DC
  Administered 2015-01-17: 2 m[IU]/min via INTRAVENOUS
  Filled 2015-01-17: qty 1000

## 2015-01-17 MED ORDER — ZOLPIDEM TARTRATE 5 MG PO TABS: 5.0000 mg | ORAL_TABLET | Freq: Every evening | ORAL | Status: DC | PRN

## 2015-01-17 MED ORDER — OXYCODONE-ACETAMINOPHEN 5-325 MG PO TABS
2.0000 | ORAL_TABLET | ORAL | Status: DC | PRN
Start: 2015-01-17 — End: 2015-01-19
  Administered 2015-01-18 – 2015-01-19 (×3): 2 via ORAL
  Filled 2015-01-17 (×3): qty 2

## 2015-01-17 MED ORDER — CITRIC ACID-SODIUM CITRATE 334-500 MG/5ML PO SOLN
30.0000 mL | ORAL | Status: DC | PRN
Start: 2015-01-17 — End: 2015-01-17

## 2015-01-17 MED ORDER — FENTANYL 2.5 MCG/ML BUPIVACAINE 1/10 % EPIDURAL INFUSION (WH - ANES): 14.0000 mL/h | INTRAMUSCULAR | Status: DC | PRN

## 2015-01-17 MED ORDER — CEFAZOLIN SODIUM 1-5 GM-% IV SOLN
1.0000 g | Freq: Three times a day (TID) | INTRAVENOUS | Status: DC
  Administered 2015-01-17: 1 g via INTRAVENOUS
  Filled 2015-01-17 (×4): qty 50

## 2015-01-17 MED ORDER — WITCH HAZEL-GLYCERIN EX PADS: 1.0000 "application " | MEDICATED_PAD | CUTANEOUS | Status: DC | PRN

## 2015-01-17 MED ORDER — CLINDAMYCIN PHOSPHATE 900 MG/50ML IV SOLN: 900.0000 mg | Freq: Three times a day (TID) | INTRAVENOUS | Status: DC

## 2015-01-17 MED ORDER — FLEET ENEMA 7-19 GM/118ML RE ENEM: 1.0000 | ENEMA | RECTAL | Status: DC | PRN

## 2015-01-17 MED ORDER — LACTATED RINGERS IV SOLN
INTRAVENOUS | Status: DC
  Administered 2015-01-17: 08:00:00 via INTRAVENOUS

## 2015-01-17 MED ORDER — PRENATAL MULTIVITAMIN CH
1.0000 | ORAL_TABLET | Freq: Every day | ORAL | Status: DC
  Administered 2015-01-18 – 2015-01-19 (×2): 1 via ORAL
  Filled 2015-01-17 (×2): qty 1

## 2015-01-17 MED ORDER — IBUPROFEN 600 MG PO TABS
600.0000 mg | ORAL_TABLET | Freq: Four times a day (QID) | ORAL | Status: DC
  Administered 2015-01-18 – 2015-01-19 (×6): 600 mg via ORAL
  Filled 2015-01-17 (×6): qty 1

## 2015-01-17 MED ORDER — ONDANSETRON HCL 4 MG/2ML IJ SOLN: 4.0000 mg | Freq: Four times a day (QID) | INTRAMUSCULAR | Status: DC | PRN

## 2015-01-17 MED ORDER — LACTATED RINGERS IV SOLN: 500.0000 mL | INTRAVENOUS | Status: DC | PRN

## 2015-01-17 MED ORDER — TERBUTALINE SULFATE 1 MG/ML IJ SOLN
0.2500 mg | Freq: Once | INTRAMUSCULAR | Status: DC | PRN
  Filled 2015-01-17: qty 1

## 2015-01-17 MED ORDER — SIMETHICONE 80 MG PO CHEW: 80.0000 mg | CHEWABLE_TABLET | ORAL | Status: DC | PRN

## 2015-01-17 MED ORDER — METHYLERGONOVINE MALEATE 0.2 MG/ML IJ SOLN
0.2000 mg | Freq: Once | INTRAMUSCULAR | Status: AC
  Administered 2015-01-17: 0.2 mg via INTRAMUSCULAR

## 2015-01-17 MED ORDER — DIPHENHYDRAMINE HCL 25 MG PO CAPS: 25.0000 mg | ORAL_CAPSULE | Freq: Four times a day (QID) | ORAL | Status: DC | PRN

## 2015-01-17 MED ORDER — DIBUCAINE 1 % RE OINT: 1.0000 "application " | TOPICAL_OINTMENT | RECTAL | Status: DC | PRN

## 2015-01-17 MED ORDER — LIDOCAINE HCL (PF) 1 % IJ SOLN
30.0000 mL | INTRAMUSCULAR | Status: DC | PRN
Start: 2015-01-17 — End: 2015-01-17
  Filled 2015-01-17: qty 30

## 2015-01-17 MED ORDER — CEFAZOLIN SODIUM-DEXTROSE 2-3 GM-% IV SOLR
2.0000 g | Freq: Once | INTRAVENOUS | Status: AC
  Administered 2015-01-17: 2 g via INTRAVENOUS
  Filled 2015-01-17: qty 50

## 2015-01-17 MED ORDER — OXYTOCIN BOLUS FROM INFUSION
500.0000 mL | INTRAVENOUS | Status: DC
  Administered 2015-01-17: 500 mL via INTRAVENOUS
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
## 2015-01-17 NOTE — Anesthesia Procedure Notes (Addendum)
Epidural Patient location during procedure: OB  Preanesthetic Checklist Completed: patient identified, site marked, surgical consent, pre-op evaluation, timeout performed, IV checked, risks and benefits discussed and monitors and equipment checked  Epidural Patient position: sitting Prep: site prepped and draped and DuraPrep Patient monitoring: continuous pulse ox and blood pressure Approach: midline Location: L4-L5 Injection technique: LOR air  Needle:  Needle type: Tuohy  Needle gauge: 17 G Needle length: 9 cm and 9 Needle insertion depth: 6 cm Catheter type: closed end flexible Catheter size: 19 Gauge Catheter at skin depth: 12 cm Test dose: negative  Assessment Events: blood not aspirated, injection not painful, no injection resistance, negative IV test and no paresthesia  Additional Notes Dosing of Epidural:  1st dose, through catheter ............................................Marland Kitchen  Xylocaine 40 mg  2nd dose, through catheter, after waiting 3 minutes........Marland KitchenXylocaine 60 mg    As each dose occurred, patient was free of IV sx; and patient exhibited no evidence of SA injection.   Please see RN's note for documentation of vital signs,and FHR which are stable.  Patient reminded not to try to ambulate with numb legs, and that an RN must be present when she attempts to get up.      Epidural Patient location during procedure: OB  Preanesthetic Checklist Completed: patient identified, site marked, surgical consent, pre-op evaluation, timeout performed, IV checked, risks and benefits discussed and monitors and equipment checked  Epidural Patient position: sitting Prep: site prepped and draped and DuraPrep Patient monitoring: continuous pulse ox and blood pressure Approach: midline Location: L2-L3 Injection technique: LOR air  Needle:  Needle type: Tuohy  Needle gauge: 17 G Needle length: 9 cm and 9 Needle insertion depth: 5 cm cm Catheter type: closed end  flexible Catheter size: 19 Gauge Catheter at skin depth: 10 cm Test dose: negative  Assessment Events: blood not aspirated, injection not painful, no injection resistance, negative IV test and no paresthesia  Additional Notes Dosing of Epidural:  1st dose, through catheter ............................................Marland Kitchen Xylocaine 30 mg  2nd dose, through catheter, after waiting 3 minutes.......Marland KitchenXylocaine 50 mg     As each dose occurred, patient was free of IV sx; and patient exhibited no evidence of SA injection.  Patient is more comfortable after epidural dosed. Please see RN's note for documentation of vital signs,and FHR which are stable.  Patient reminded not to try to ambulate with numb legs, and that an RN must be present the 1st time she attempts to get up.

## 2015-01-17 NOTE — Plan of Care (Signed)
Problem: Consults Goal: Orientation to unit: Other (Specify with a note) Outcome: Completed/Met Date Met:  01/17/15 Set up password "[email or web address]" to ask everyone to leave room if needed.

## 2015-01-17 NOTE — Anesthesia Preprocedure Evaluation (Signed)
Anesthesia Evaluation  Patient identified by MRN, date of birth, ID band Patient awake    Reviewed: Allergy & Precautions, H&P , Patient's Chart, lab work & pertinent test results  Airway Mallampati: II  TM Distance: >3 FB Neck ROM: full    Dental  (+) Teeth Intact   Pulmonary    breath sounds clear to auscultation       Cardiovascular  Rhythm:regular Rate:Normal     Neuro/Psych    GI/Hepatic   Endo/Other    Renal/GU      Musculoskeletal   Abdominal   Peds  Hematology   Anesthesia Other Findings  Recent trauma; (-) head CT & C Spine CT Low K+    Reproductive/Obstetrics (+) Pregnancy                             Anesthesia Physical Anesthesia Plan  ASA: II  Anesthesia Plan: Epidural   Post-op Pain Management:    Induction:   Airway Management Planned:   Additional Equipment:   Intra-op Plan:   Post-operative Plan:   Informed Consent: I have reviewed the patients History and Physical, chart, labs and discussed the procedure including the risks, benefits and alternatives for the proposed anesthesia with the patient or authorized representative who has indicated his/her understanding and acceptance.   Dental Advisory Given  Plan Discussed with:   Anesthesia Plan Comments: (Labs checked- platelets confirmed with RN in room. Fetal heart tracing, per RN, reported to be stable enough for sitting procedure. Discussed epidural, and patient consents to the procedure:  included risk of possible headache,backache, failed block, allergic reaction, and nerve injury. This patient was asked if she had any questions or concerns before the procedure started.)        Anesthesia Quick Evaluation

## 2015-01-18 DIAGNOSIS — IMO0002 Reserved for concepts with insufficient information to code with codable children: Secondary | ICD-10-CM

## 2015-01-18 LAB — CBC
HEMATOCRIT: 33.6 % — AB (ref 36.0–46.0)
HEMOGLOBIN: 10.8 g/dL — AB (ref 12.0–15.0)
MCH: 23.2 pg — AB (ref 26.0–34.0)
MCHC: 32.1 g/dL (ref 30.0–36.0)
MCV: 72.3 fL — AB (ref 78.0–100.0)
Platelets: 310 10*3/uL (ref 150–400)
RBC: 4.65 MIL/uL (ref 3.87–5.11)
RDW: 19.8 % — ABNORMAL HIGH (ref 11.5–15.5)
WBC: 17.4 10*3/uL — ABNORMAL HIGH (ref 4.0–10.5)

## 2015-01-18 NOTE — Progress Notes (Signed)
UR chart review completed.  

## 2015-01-18 NOTE — Progress Notes (Signed)
Patient ID: Anita Allen, female   DOB: August 22, 1990, 24 y.o.   MRN: [id number] Postpartum day one Blood pressure 106/85 pulse 77 respiration 18 Fundus firm Lochia moderate Legs negative Doing well

## 2015-01-18 NOTE — Anesthesia Postprocedure Evaluation (Signed)
  Anesthesia Post-op Note  Patient: Anita Allen  Procedure(s) Performed: * No procedures listed *  Patient Location: Mother/Baby  Anesthesia Type:Epidural  Level of Consciousness: awake and alert   Airway and Oxygen Therapy: Patient Spontanous Breathing  Post-op Pain: mild  Post-op Assessment: Post-op Vital signs reviewed, Patient's Cardiovascular Status Stable, Respiratory Function Stable, No signs of Nausea or vomiting, Pain level controlled, No headache, Spinal receding and Patient able to bend at knees              Post-op Vital Signs: Reviewed  Last Vitals:  Filed Vitals:   01/18/15 0515  BP: 106/85  Pulse: 77  Temp: 37 C  Resp: 18    Complications: No apparent anesthesia complications
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
## 2015-01-18 NOTE — Progress Notes (Signed)
Anita Allen has been caring for her [age]-year-old daughter in her room all day. During the twenty minute period while I was in the room performing a postpartum assessment, Anita Allen became aggravated with her [age]-year-old daughter playful behavior. The [age] year old had gotten up off the couch to interact with me, dancing in the room along side the bed. Anita Allen told her daughter to "shut up" and "sit her ass down" on the couch. The child got back up on the couch but was still verbalizing and playing, typical of a four-year old child. In a louder, demanding voice, Anita Allen then told the child "You come here now!" The child hesitated and started crying after repeated demands by Anita Allen to come to her. Anita Allen pulled the child onto the bed and spanked her on her legs 4-5 times, after which time the child returned to the couch crying.

## 2015-01-19 MED ORDER — OXYCODONE-ACETAMINOPHEN 5-325 MG PO TABS: 1.0000 | ORAL_TABLET | ORAL | Status: DC | PRN

## 2015-01-19 MED ORDER — IBUPROFEN 600 MG PO TABS: 600.0000 mg | ORAL_TABLET | Freq: Four times a day (QID) | ORAL | Status: DC | PRN

## 2015-01-19 NOTE — Lactation Note (Signed)
This note was copied from the chart of Anita Allen. Lactation Consultation Note  Baby sleepy.  Suggest mother undress to diaper.  Attempted latching but baby too sleepy. Encouraged STS.  Reviewed engorgement care and monitoring voids/stools. Suggest mother call LC if he continues not to latch after STS.  Patient Name: Anita Lucina Allen [id number] Date: 01/19/2015 Reason for consult: Follow-up assessment   Maternal Data    Feeding Feeding Type:  (enc mother to feed)  LATCH Score/Interventions                      Lactation Tools Discussed/Used     Consult Status Consult Status: Complete    Hardie Pulley 01/19/2015, 11:51 AM

## 2015-01-19 NOTE — Progress Notes (Signed)
Post Partum Day 2 Subjective: no complaints  Objective: Blood pressure 110/73, pulse 65, temperature 98.1 F (36.7 C), temperature source Oral, resp. rate 18, height  (1.549 m), weight 169 lb (76.658 kg), last menstrual period 04/24/2014, SpO2 99 %, unknown if currently breastfeeding.  Physical Exam:  General: alert and no distress Lochia: appropriate Uterine Fundus: firm Incision: none DVT Evaluation: No evidence of DVT seen on physical exam.   Recent Labs  01/17/15 0750 01/18/15 0600  HGB 10.1* 10.8*  HCT 31.1* 33.6*    Assessment/Plan: Discharge home   LOS: 2 days   Anita Allen A 01/19/2015, 7:22 AM

## 2015-01-19 NOTE — Discharge Summary (Signed)
Obstetric Discharge Summary Reason for Admission: induction of labor Prenatal Procedures: ultrasound Intrapartum Procedures: spontaneous vaginal delivery Postpartum Procedures: none Complications-Operative and Postpartum: none HEMOGLOBIN  Date Value Ref Range Status  01/18/2015 10.8* 12.0 - 15.0 g/dL Final   HCT  Date Value Ref Range Status  01/18/2015 33.6* 36.0 - 46.0 % Final    Physical Exam:  General: alert and no distress Lochia: appropriate Uterine Fundus: firm Incision: none DVT Evaluation: No evidence of DVT seen on physical exam.  Discharge Diagnoses: Term Pregnancy-delivered  Discharge Information: Date: 01/19/2015 Activity: pelvic rest Diet: routine Medications: None, Ibuprofen, Colace and Percocet Condition: stable Instructions: refer to practice specific booklet Discharge to: home Follow-up Information    Follow up with MARSHALL,BERNARD A, MD In 6 weeks.   Specialty:  Obstetrics and Gynecology   Contact information:   [address] [place] [address] [phone]       Newborn Data: Live born female  Birth Weight: 6 lb 14.9 oz (3145 g) APGAR: 8, 9  Home with mother.  Darely Becknell A 01/19/2015, 4:10 PM

## 2015-05-01 ENCOUNTER — Encounter (HOSPITAL_COMMUNITY): Payer: Self-pay | Admitting: Emergency Medicine

## 2015-05-01 ENCOUNTER — Emergency Department (HOSPITAL_COMMUNITY)
Admission: EM | Admit: 2015-05-01 | Discharge: 2015-05-01 | Disposition: A | Discharge status: Home / Self Care | Payer: Medicaid Other | Attending: Emergency Medicine | Admitting: Emergency Medicine

## 2015-05-01 DIAGNOSIS — R519 Headache, unspecified: Secondary | ICD-10-CM

## 2015-05-01 DIAGNOSIS — R110 Nausea: Secondary | ICD-10-CM | POA: Insufficient documentation

## 2015-05-01 DIAGNOSIS — R51 Headache: Secondary | ICD-10-CM | POA: Insufficient documentation

## 2015-05-01 DIAGNOSIS — Z91040 Latex allergy status: Secondary | ICD-10-CM | POA: Insufficient documentation

## 2015-05-01 DIAGNOSIS — H53149 Visual discomfort, unspecified: Secondary | ICD-10-CM | POA: Insufficient documentation

## 2015-05-01 MED ORDER — METOCLOPRAMIDE HCL 10 MG PO TABS
10.0000 mg | ORAL_TABLET | Freq: Once | ORAL | Status: AC
Start: 2015-05-01 — End: 2015-05-01
  Administered 2015-05-01: 10 mg via ORAL
  Filled 2015-05-01: qty 1

## 2015-05-01 MED ORDER — KETOROLAC TROMETHAMINE 60 MG/2ML IM SOLN
60.0000 mg | Freq: Once | INTRAMUSCULAR | Status: AC
  Administered 2015-05-01: 60 mg via INTRAMUSCULAR
  Filled 2015-05-01: qty 2

## 2015-05-01 MED ORDER — DIPHENHYDRAMINE HCL 25 MG PO CAPS
50.0000 mg | ORAL_CAPSULE | Freq: Once | ORAL | Status: AC
  Administered 2015-05-01: 50 mg via ORAL
  Filled 2015-05-01: qty 2

## 2015-05-01 MED ORDER — METOCLOPRAMIDE HCL 10 MG PO TABS: 10.0000 mg | ORAL_TABLET | Freq: Four times a day (QID) | ORAL | Status: DC

## 2015-05-01 MED ORDER — KETOROLAC TROMETHAMINE 10 MG PO TABS: 10.0000 mg | ORAL_TABLET | Freq: Three times a day (TID) | ORAL | Status: DC | PRN

## 2015-05-01 NOTE — ED Notes (Addendum)
Pt ambulatory with steady gait to the lobby with family.

## 2015-05-01 NOTE — ED Provider Notes (Signed)
Patient seen/examined in the Emergency Department in conjunction with Midlevel Provider [place] Patient reports HA.  She reports it is similar to prior Exam : awake/alert, no distress, no facial droop, no arm drift Plan: stable for d/c home We discussed strict ER return precautions    Zadie Rhineonald Myracle Febres, MD 05/01/15 [phone]

## 2015-05-01 NOTE — Discharge Instructions (Signed)
Follow up with your doctor as needed. Take medications as prescribed. Return to ED for any new or worsening symptoms.

## 2015-05-01 NOTE — ED Notes (Signed)
Patient verbalized understanding of discharge instructions and denies any further needs or questions at this time. VS stable, patient ambulatory with steady gait. 

## 2015-05-01 NOTE — ED Provider Notes (Signed)
CSN: [id number]     Arrival date & time 05/01/15  0111 History   First MD Initiated Contact with Patient 05/01/15 0124     Chief Complaint  Patient presents with  . Headache     (Consider location/radiation/quality/duration/timing/severity/associated sxs/prior Treatment) HPI Anita Allen is a 24 y.o. female  Supervising portion of headache. Reports she has had a headache over the past 4 days.   Characterized as throbbing sensation in her left head. She has tried Tylenol with some relief. During the first 2 days, but is not as effective recently. Also ineffective treatments include Motrin and Excedrin.  She reports mild nausea, phonophobia and photophobia. She denies any fevers, chills, numbness or weakness, vision changes, vomiting, neck pain or stiffness , rash , recent illness. Denies any risk of pregnancy.  Past Medical History  Diagnosis Date  . No pertinent past medical history   . Gonorrhea contact, treated   . Vaginal delivery 2012, 2013  . Molar pregnancy 12-2013  . H/O dilation and curettage    Past Surgical History  Procedure Laterality Date  . No past surgeries    . Dilation and evacuation N/A 12/20/2013    Procedure: DILATATION AND EVACUATION;  Surgeon: Adam PhenixJames G Arnold, MD;  Location: [facility];  Service: Gynecology;  Laterality: N/A;  . Dilation and curettage of uterus     Family History  Problem Relation Age of Onset  . Heart disease Father    Social History  Substance Use Topics  . Smoking status: Never Smoker   . Smokeless tobacco: Never Used  . Alcohol Use: No   OB History    Gravida Para Term Preterm AB TAB SAB Ectopic Multiple Living   5 3 3  0 2 1 1  0 0 3     Review of Systems A 10 point review of systems was completed and was negative except for pertinent positives and negatives as mentioned in the history of present illness     Allergies  Augmentin and Latex  Home Medications   Prior to Admission medications   Medication Sig Start Date End Date  Taking? Authorizing Provider  acetaminophen (TYLENOL) 325 MG tablet Take 650 mg by mouth every 6 (six) hours as needed for headache.   Yes Historical Provider, MD  aspirin-acetaminophen-caffeine (EXCEDRIN MIGRAINE) [phone] MG tablet Take 1 tablet by mouth every 6 (six) hours as needed for headache.   Yes Historical Provider, MD  ibuprofen (ADVIL,MOTRIN) 200 MG tablet Take 200 mg by mouth every 6 (six) hours as needed for headache.   Yes Historical Provider, MD  ibuprofen (ADVIL,MOTRIN) 600 MG tablet Take 1 tablet (600 mg total) by mouth every 6 (six) hours as needed for mild pain. Patient not taking: Reported on 05/01/2015 01/19/15   Brock Badharles A Harper, MD  ketorolac (TORADOL) 10 MG tablet Take 1 tablet (10 mg total) by mouth every 8 (eight) hours as needed. 05/01/15   Joycie PeekBenjamin Breken Nazari, PA-C  metoCLOPramide (REGLAN) 10 MG tablet Take 1 tablet (10 mg total) by mouth every 6 (six) hours. 05/01/15   Joycie PeekBenjamin Texanna Hilburn, PA-C  oxyCODONE-acetaminophen (PERCOCET/ROXICET) 5-325 MG per tablet Take 1-2 tablets by mouth every 4 (four) hours as needed for moderate pain or severe pain (for pain scale greater than 7). Patient not taking: Reported on 05/01/2015 01/19/15   Brock Badharles A Harper, MD   BP 117/78 mmHg  Pulse 66  Temp(Src) 98.1 F (36.7 C) (Oral)  Resp 16  SpO2 99%  LMP 04/24/2015 (Within Days)  Breastfeeding? No Physical  Exam  Constitutional: She is oriented to person, place, and time. She appears well-developed and well-nourished. No distress.  HENT:  Head: Normocephalic and atraumatic.  Mouth/Throat: Oropharynx is clear and moist.  Eyes: Conjunctivae and EOM are normal. Pupils are equal, round, and reactive to light.  Neck: Normal range of motion.  No meningismus or nuchal rigidity  Cardiovascular: Normal rate, regular rhythm and normal heart sounds.   Pulmonary/Chest: Effort normal and breath sounds normal.  Abdominal: Soft. There is no tenderness.  Musculoskeletal: Normal range of motion. She  exhibits no edema or tenderness.  Neurological: She is alert and oriented to person, place, and time.  Cranial nerves III through XII grossly intact. Motor strength is 5/5 in all 4 extremities and sensation is intact to light touch. Completes finger to nose coordination movements without difficulty. No nystagmus. Gait is baseline without ataxia.  Skin: She is not diaphoretic.    ED Course  Procedures (including critical care time) Labs Review Labs Reviewed - No data to display  Imaging Review No results found. I have personally reviewed and evaluated these images and lab results as part of my medical decision-making.   EKG Interpretation None     Meds given in ED:  Medications  ketorolac (TORADOL) injection 60 mg (60 mg Intramuscular Given 05/01/15 0152)  metoCLOPramide (REGLAN) tablet 10 mg (10 mg Oral Given 05/01/15 0153)  diphenhydrAMINE (BENADRYL) capsule 50 mg (50 mg Oral Given 05/01/15 0153)    Discharge Medication List as of 05/01/2015  2:35 AM    START taking these medications   Details  ketorolac (TORADOL) 10 MG tablet Take 1 tablet (10 mg total) by mouth every 8 (eight) hours as needed., Starting 05/01/2015, Until Discontinued, Print    metoCLOPramide (REGLAN) 10 MG tablet Take 1 tablet (10 mg total) by mouth every 6 (six) hours., Starting 05/01/2015, Until Discontinued, Print       Filed Vitals:   05/01/15 0230 05/01/15 0245 05/01/15 0300 05/01/15 0309  BP: 119/82 113/87 117/78 117/78  Pulse: 66 64 62 66  Temp:    98.1 F (36.7 C)  TempSrc:    Oral  Resp:    16  SpO2: 100% 99% 100% 99%    MDM  Vitals stable, afebrile Feels better after analgesia in ED. Normal neurological exam. No dizziness. Gait baseline. HA gradual in onset, progressively worsening. Similar to previous HA No hx of traumatic injury No hx of clotting disorder, peripartum or postpartum state, Immunocompromise Doubt Meningitis, CVA/SAH/ICH, Dural Venous Thrombosis, Seizure, Central  Lesion or Tumor, Giant Cell Arteritis, Cervical/Carotid or other vascular dissection. I have personally reviewed all labs, imaging, nursing/prvious notes during the patient's evaluation in the ED today. No evidence of other acute or emergent pathology that requires immediate intervention at this time. Pt stable, in good condition and is appropriate for discharge. DC with Toradol and Reglan and instructions to follow up with PCP within 48 hrs for further evaluation and management of symptoms.   Final diagnoses:  Nonintractable headache, unspecified chronicity pattern, unspecified headache type        Joycie Peek, PA-C 05/01/15 1610  Zadie Rhine, MD 05/01/15 [phone]

## 2015-05-01 NOTE — ED Notes (Signed)
Pt states that she started having a migraine 4 days ago. Pt states that she has not had migraines since she was a child, when they were severe. Pt reports OTC medications working the first 2 days, but not helping these last 2 days.

## 2015-07-20 ENCOUNTER — Other Ambulatory Visit (HOSPITAL_COMMUNITY)
Admission: RE | Admit: 2015-07-20 | Discharge: 2015-07-20 | Disposition: A | Discharge status: Home / Self Care | Payer: Medicaid Other | Source: Ambulatory Visit | Attending: Emergency Medicine | Admitting: Emergency Medicine

## 2015-07-20 ENCOUNTER — Emergency Department (INDEPENDENT_AMBULATORY_CARE_PROVIDER_SITE_OTHER)
Admission: EM | Admit: 2015-07-20 | Discharge: 2015-07-20 | Disposition: A | Discharge status: Home / Self Care | Payer: Self-pay | Source: Home / Self Care | Attending: Emergency Medicine | Admitting: Emergency Medicine

## 2015-07-20 ENCOUNTER — Encounter (HOSPITAL_COMMUNITY): Payer: Self-pay | Admitting: Emergency Medicine

## 2015-07-20 DIAGNOSIS — J069 Acute upper respiratory infection, unspecified: Secondary | ICD-10-CM | POA: Diagnosis not present

## 2015-07-20 LAB — POCT RAPID STREP A: Streptococcus, Group A Screen (Direct): NEGATIVE

## 2015-07-20 NOTE — Discharge Instructions (Signed)
Upper Respiratory Infection, Adult May take the over-the-counter medicine of your choice such as Alka-Seltzer cold and flu medication. Also recommend taking ibuprofen 600 mg every 6-8 hours as needed for discomfort. Drink fluids and stay well-hydrated. Most upper respiratory infections (URIs) are a viral infection of the air passages leading to the lungs. A URI affects the nose, throat, and upper air passages. The most common type of URI is nasopharyngitis and is typically referred to as "the common cold." URIs run their course and usually go away on their own. Most of the time, a URI does not require medical attention, but sometimes a bacterial infection in the upper airways can follow a viral infection. This is called a secondary infection. Sinus and middle ear infections are common types of secondary upper respiratory infections. Bacterial pneumonia can also complicate a URI. A URI can worsen asthma and chronic obstructive pulmonary disease (COPD). Sometimes, these complications can require emergency medical care and may be life threatening.  CAUSES Almost all URIs are caused by viruses. A virus is a type of germ and can spread from one person to another.  RISKS FACTORS You may be at risk for a URI if:   You smoke.   You have chronic heart or lung disease.  You have a weakened defense (immune) system.   You are very young or very old.   You have nasal allergies or asthma.  You work in crowded or poorly ventilated areas.  You work in health care facilities or schools. SIGNS AND SYMPTOMS  Symptoms typically develop 2-3 days after you come in contact with a cold virus. Most viral URIs last 7-10 days. However, viral URIs from the influenza virus (flu virus) can last 14-18 days and are typically more severe. Symptoms may include:   Runny or stuffy (congested) nose.   Sneezing.   Cough.   Sore throat.   Headache.   Fatigue.   Fever.   Loss of appetite.   Pain in  your forehead, behind your eyes, and over your cheekbones (sinus pain).  Muscle aches.  DIAGNOSIS  Your health care provider may diagnose a URI by:  Physical exam.  Tests to check that your symptoms are not due to another condition such as:  Strep throat.  Sinusitis.  Pneumonia.  Asthma. TREATMENT  A URI goes away on its own with time. It cannot be cured with medicines, but medicines may be prescribed or recommended to relieve symptoms. Medicines may help:  Reduce your fever.  Reduce your cough.  Relieve nasal congestion. HOME CARE INSTRUCTIONS   Take medicines only as directed by your health care provider.   Gargle warm saltwater or take cough drops to comfort your throat as directed by your health care provider.  Use a warm mist humidifier or inhale steam from a shower to increase air moisture. This may make it easier to breathe.  Drink enough fluid to keep your urine clear or pale yellow.   Eat soups and other clear broths and maintain good nutrition.   Rest as needed.   Return to work when your temperature has returned to normal or as your health care provider advises. You may need to stay home longer to avoid infecting others. You can also use a face mask and careful hand washing to prevent spread of the virus.  Increase the usage of your inhaler if you have asthma.   Do not use any tobacco products, including cigarettes, chewing tobacco, or electronic cigarettes. If you need help quitting,  ask your health care provider. PREVENTION  The best way to protect yourself from getting a cold is to practice good hygiene.   Avoid oral or hand contact with people with cold symptoms.   Wash your hands often if contact occurs.  There is no clear evidence that vitamin C, vitamin E, echinacea, or exercise reduces the chance of developing a cold. However, it is always recommended to get plenty of rest, exercise, and practice good nutrition.  SEEK MEDICAL CARE IF:    You are getting worse rather than better.   Your symptoms are not controlled by medicine.   You have chills.  You have worsening shortness of breath.  You have brown or red mucus.  You have yellow or brown nasal discharge.  You have pain in your face, especially when you bend forward.  You have a fever.  You have swollen neck glands.  You have pain while swallowing.  You have white areas in the back of your throat. SEEK IMMEDIATE MEDICAL CARE IF:   You have severe or persistent:  Headache.  Ear pain.  Sinus pain.  Chest pain.  You have chronic lung disease and any of the following:  Wheezing.  Prolonged cough.  Coughing up blood.  A change in your usual mucus.  You have a stiff neck.  You have changes in your:  Vision.  Hearing.  Thinking.  Mood. MAKE SURE YOU:   Understand these instructions.  Will watch your condition.  Will get help right away if you are not doing well or get worse.   This information is not intended to replace advice given to you by your health care provider. Make sure you discuss any questions you have with your health care provider.   Document Released: 10/21/2000 Document Revised: 09/11/2014 Document Reviewed: 08/02/2013 Elsevier Interactive Patient Education [employer]2016 [employer]

## 2015-07-20 NOTE — ED Notes (Signed)
C/o cold sx onset x3 days associated w/ST, cough, fevers, bilateral ear pain, runny nose Taking OTC cold meds w/no relief.  A&O x4... No acute distress.

## 2015-07-20 NOTE — ED Provider Notes (Signed)
CSN: [id number]     Arrival date & time 07/20/15  1956 History   First MD Initiated Contact with Patient 07/20/15 2036     Chief Complaint  Patient presents with  . URI   (Consider location/radiation/quality/duration/timing/severity/associated sxs/prior Treatment) HPI Comments: 25 year old female complaining of sore throat, questionable fever, cough, runny nose, intermittent chills and right earache for 3 days. She is taking no medication.  Patient is a 25 y.o. female presenting with URI.  URI Presenting symptoms: congestion, cough and rhinorrhea   Presenting symptoms: no fatigue, no fever and no sore throat   Associated symptoms: no neck pain     Past Medical History  Diagnosis Date  . No pertinent past medical history   . Gonorrhea contact, treated   . Vaginal delivery 2012, 2013  . Molar pregnancy 12-2013  . H/O dilation and curettage    Past Surgical History  Procedure Laterality Date  . No past surgeries    . Dilation and evacuation N/A 12/20/2013    Procedure: DILATATION AND EVACUATION;  Surgeon: Adam PhenixJames G Arnold, MD;  Location: [facility];  Service: Gynecology;  Laterality: N/A;  . Dilation and curettage of uterus     Family History  Problem Relation Age of Onset  . Heart disease Father    Social History  Substance Use Topics  . Smoking status: Never Smoker   . Smokeless tobacco: Never Used  . Alcohol Use: No   OB History    Gravida Para Term Preterm AB TAB SAB Ectopic Multiple Living   5 3 3  0 2 1 1  0 0 3     Review of Systems  Constitutional: Positive for chills. Negative for fever, activity change, appetite change and fatigue.  HENT: Positive for congestion, postnasal drip and rhinorrhea. Negative for facial swelling and sore throat.   Eyes: Negative.   Respiratory: Positive for cough.   Cardiovascular: Negative.   Musculoskeletal: Negative for neck pain and neck stiffness.  Skin: Negative for pallor and rash.  Neurological: Negative.     Allergies   Augmentin and Latex  Home Medications   Prior to Admission medications   Medication Sig Start Date End Date Taking? Authorizing Provider  acetaminophen (TYLENOL) 325 MG tablet Take 650 mg by mouth every 6 (six) hours as needed for headache.    Historical Provider, MD  aspirin-acetaminophen-caffeine (EXCEDRIN MIGRAINE) [phone] MG tablet Take 1 tablet by mouth every 6 (six) hours as needed for headache.    Historical Provider, MD  ibuprofen (ADVIL,MOTRIN) 200 MG tablet Take 200 mg by mouth every 6 (six) hours as needed for headache.    Historical Provider, MD  ibuprofen (ADVIL,MOTRIN) 600 MG tablet Take 1 tablet (600 mg total) by mouth every 6 (six) hours as needed for mild pain. Patient not taking: Reported on 05/01/2015 01/19/15   Brock Badharles A Harper, MD  ketorolac (TORADOL) 10 MG tablet Take 1 tablet (10 mg total) by mouth every 8 (eight) hours as needed. 05/01/15   Joycie PeekBenjamin Cartner, PA-C  metoCLOPramide (REGLAN) 10 MG tablet Take 1 tablet (10 mg total) by mouth every 6 (six) hours. 05/01/15   Joycie PeekBenjamin Cartner, PA-C  oxyCODONE-acetaminophen (PERCOCET/ROXICET) 5-325 MG per tablet Take 1-2 tablets by mouth every 4 (four) hours as needed for moderate pain or severe pain (for pain scale greater than 7). Patient not taking: Reported on 05/01/2015 01/19/15   Brock Badharles A Harper, MD   Meds Ordered and Administered this Visit  Medications - No data to display  BP 113/72 mmHg  Pulse 90  Temp(Src) 98 F (36.7 C) (Oral)  Resp 16  SpO2 98%  LMP 07/09/2015  Breastfeeding? No No data found.   Physical Exam  Constitutional: She is oriented to person, place, and time. She appears well-developed and well-nourished. No distress.  HENT:  Mouth/Throat: No oropharyngeal exudate.  Bilateral TMs mildly retracted. Minor erythema to the left.  Oropharynx with no erythema. Minor cobblestoning and clear PND.  Eyes: Conjunctivae and EOM are normal.  Neck: Normal range of motion. Neck supple.   Cardiovascular: Normal rate, regular rhythm and normal heart sounds.   Pulmonary/Chest: Effort normal and breath sounds normal. No respiratory distress. She has no wheezes.  Musculoskeletal: Normal range of motion. She exhibits no edema.  Lymphadenopathy:    She has no cervical adenopathy.  Neurological: She is alert and oriented to person, place, and time.  Skin: Skin is warm and dry. No rash noted.  Psychiatric: She has a normal mood and affect.  Nursing note and vitals reviewed.   ED Course  Procedures (including critical care time)  Labs Review Labs Reviewed  POCT RAPID STREP A    Imaging Review No results found.   Visual Acuity Review  Right Eye Distance:   Left Eye Distance:   Bilateral Distance:    Right Eye Near:   Left Eye Near:    Bilateral Near:         MDM   1. URI (upper respiratory infection)    May take the over-the-counter medicine of your choice such as Alka-Seltzer cold and flu medication. Also recommend taking ibuprofen 600 mg every 6-8 hours as needed for discomfort. Drink fluids and stay well-hydrated.     Hayden Rasmussen, NP 07/20/15 2106

## 2015-07-24 LAB — CULTURE, GROUP A STREP (THRC)

## 2015-09-06 ENCOUNTER — Encounter (HOSPITAL_COMMUNITY): Payer: Self-pay

## 2015-09-06 ENCOUNTER — Emergency Department (HOSPITAL_COMMUNITY)
Admission: EM | Admit: 2015-09-06 | Discharge: 2015-09-06 | Disposition: A | Discharge status: Home / Self Care | Payer: Medicaid Other | Attending: Emergency Medicine | Admitting: Emergency Medicine

## 2015-09-06 DIAGNOSIS — L509 Urticaria, unspecified: Secondary | ICD-10-CM | POA: Insufficient documentation

## 2015-09-06 DIAGNOSIS — Z91040 Latex allergy status: Secondary | ICD-10-CM | POA: Insufficient documentation

## 2015-09-06 DIAGNOSIS — R21 Rash and other nonspecific skin eruption: Secondary | ICD-10-CM

## 2015-09-06 MED ORDER — HYDROXYZINE HCL 25 MG PO TABS: 25.0000 mg | ORAL_TABLET | Freq: Four times a day (QID) | ORAL | Status: DC | PRN

## 2015-09-06 MED ORDER — HYDROCORTISONE 2.5 % EX CREA: TOPICAL_CREAM | Freq: Two times a day (BID) | CUTANEOUS | Status: DC | PRN

## 2015-09-06 NOTE — ED Notes (Signed)
Pt's jacket found in room - with [employer] card.  Called and spoke with pt.  She will pick it up at front desk.

## 2015-09-06 NOTE — Discharge Instructions (Signed)
Take hydroxyzine as needed for itching and use hydrocortisone cream as prescribed for itching/rash. Continue your usual home medications. Get plenty of rest and drink plenty of fluids. Avoid any known triggers. Please followup with your primary doctor for discussion of your diagnoses and further evaluation after today's visit. Return to the ER for changes or worsening symptoms     Hives Hives are itchy, red, swollen areas of the skin. They can vary in size and location on your body. Hives can come and go for hours or several days (acute hives) or for several weeks (chronic hives). Hives do not spread from person to person (noncontagious). They may get worse with scratching, exercise, and emotional stress. CAUSES   Allergic reaction to food, additives, or drugs.  Infections, including the common cold.  Illness, such as vasculitis, lupus, or thyroid disease.  Exposure to sunlight, heat, or cold.  Exercise.  Stress.  Contact with chemicals. SYMPTOMS   Red or white swollen patches on the skin. The patches may change size, shape, and location quickly and repeatedly.  Itching.  Swelling of the hands, feet, and face. This may occur if hives develop deeper in the skin. DIAGNOSIS  Your caregiver can usually tell what is wrong by performing a physical exam. Skin or blood tests may also be done to determine the cause of your hives. In some cases, the cause cannot be determined. TREATMENT  Mild cases usually get better with medicines such as antihistamines. Severe cases may require an emergency epinephrine injection. If the cause of your hives is known, treatment includes avoiding that trigger.  HOME CARE INSTRUCTIONS   Avoid causes that trigger your hives.  Take antihistamines as directed by your caregiver to reduce the severity of your hives. Non-sedating or low-sedating antihistamines are usually recommended. Do not drive while taking an antihistamine.  Take any other medicines  prescribed for itching as directed by your caregiver.  Wear loose-fitting clothing.  Keep all follow-up appointments as directed by your caregiver. SEEK MEDICAL CARE IF:   You have persistent or severe itching that is not relieved with medicine.  You have painful or swollen joints. SEEK IMMEDIATE MEDICAL CARE IF:   You have a fever.  Your tongue or lips are swollen.  You have trouble breathing or swallowing.  You feel tightness in the throat or chest.  You have abdominal pain. These problems may be the first sign of a life-threatening allergic reaction. Call your local emergency services (911 in [place] MAKE SURE YOU:   Understand these instructions.  Will watch your condition.  Will get help right away if you are not doing well or get worse.   This information is not intended to replace advice given to you by your health care provider. Make sure you discuss any questions you have with your health care provider.   Document Released: 04/27/2005 Document Revised: 05/02/2013 Document Reviewed: 07/21/2011 Elsevier Interactive Patient Education [employer]  Allergies An allergy is when your body reacts to a substance in a way that is not normal. An allergic reaction can happen after you:  Eat something.  Breathe in something.  Touch something. WHAT KINDS OF ALLERGIES ARE THERE? You can be allergic to:  Things that are only around during certain seasons, like molds and pollens.  Foods.  Drugs.  Insects.  Animal dander. WHAT ARE SYMPTOMS OF ALLERGIES?  Puffiness (swelling). This may happen on the lips, face, tongue, mouth, or throat.  Sneezing.  Coughing.  Breathing loudly (wheezing).  Stuffy  nose.  Tingling in the mouth.  A rash.  Itching.  Itchy, red, puffy areas of skin (hives).  Watery eyes.  Throwing up (vomiting).  Watery poop (diarrhea).  Dizziness.  Feeling faint or fainting.  Trouble breathing or swallowing.  A tight  feeling in the chest.  A fast heartbeat. HOW ARE ALLERGIES DIAGNOSED? Allergies can be diagnosed with:  A medical and family history.  Skin tests.  Blood tests.  A food diary. A food diary is a record of all the foods, drinks, and symptoms you have each day.  The results of an elimination diet. This diet involves making sure not to eat certain foods and then seeing what happens when you start eating them again. HOW ARE ALLERGIES TREATED? There is no cure for allergies, but allergic reactions can be treated with medicine. Severe reactions usually need to be treated at a hospital.  HOW CAN REACTIONS BE PREVENTED? The best way to prevent an allergic reaction is to avoid the thing you are allergic to. Allergy shots and medicines can also help prevent reactions in some cases.   This information is not intended to replace advice given to you by your health care provider. Make sure you discuss any questions you have with your health care provider.   Document Released: 08/22/2012 Document Revised: 05/18/2014 Document Reviewed: 02/06/2014 Elsevier Interactive Patient Education [employer] Inc[employer]

## 2015-09-06 NOTE — ED Notes (Addendum)
Pt here with c/o red, itchy rash to forearms and back that she noticed this morning. No facial or oral swelling. She does reports possible accidental rat feces ingestion two days ago.

## 2015-09-06 NOTE — ED Provider Notes (Signed)
CSN: [id number]     Arrival date & time 09/06/15  1752 History  By signing my name below, I, Tanda Rockers, attest that this documentation has been prepared under the direction and in the presence of [id number] Lorik Guo, [employer]. Electronically Signed: Tanda Rockers, ED Scribe. 09/06/2015. 6:16 PM.   Chief Complaint  Patient presents with  . Rash   Patient is a 25 y.o. female presenting with rash. The history is provided by the patient. No language interpreter was used.  Rash Location:  Torso and shoulder/arm Shoulder/arm rash location:  R upper arm and L upper arm Torso rash location:  Upper back and lower back Quality: itchiness and redness   Quality: not draining, not painful and not weeping   Severity:  Moderate Onset quality:  Gradual Duration:  1 hour Timing:  Constant Progression:  Improving Chronicity:  New Context: not animal contact, not chemical exposure, not exposure to similar rash, not insect bite/sting, not medications, not new detergent/soap, not plant contact and not sick contacts   Relieved by:  None tried Worsened by:  Nothing tried Ineffective treatments:  None tried Associated symptoms: no abdominal pain, no diarrhea, no fever, no induration, no joint pain, no myalgias, no nausea, no periorbital edema, no shortness of breath, no throat swelling, no tongue swelling and not vomiting      HPI Comments: Anita Allen is a 25 y.o. female who presents to the Emergency Department complaining of gradual onset, constant, itchy red rash to torso and bilateral upper extremities that began this morning. Pt also complains of swelling to her upper eyelids that has been gradually improved and is now resolved. There are no exacerbating factors to the rash. She has not taken anything for the rash. No recent changes in soaps, detergents, lotions, or cream. No exposure to animals, plants, or similar rash. No exposure to outdoors or tick bites. Pt did start taking Flagyl 2 weeks ago for BV  but denies any other recent change in medication. Pt does mention that she noticed possible mouse droppings on her kitchen counter 2 days ago and believes she may have eaten something that could have come into contact with a mouse or the droppings, but she's not sure. She also complains of a tingling sensation to the left 2nd and 3rd digits, although she denies numbness or weakness. Denies tongue swelling, facial swelling, throat swelling, difficulty swallowing/breathing, chest pain, shortness of breath, fever, chills, induration, drainage, red streaking, abdominal pain, nausea, vomiting, diarrhea, constipation, dysuria, hematuria, weakness, numbness, or any other associated symptoms. No recent changes in environment.   Past Medical History  Diagnosis Date  . No pertinent past medical history   . Gonorrhea contact, treated   . Vaginal delivery 2012, 2013  . Molar pregnancy 12-2013  . H/O dilation and curettage    Past Surgical History  Procedure Laterality Date  . No past surgeries    . Dilation and evacuation N/A 12/20/2013    Procedure: DILATATION AND EVACUATION;  Surgeon: Adam Phenix, MD;  Location: [facility];  Service: Gynecology;  Laterality: N/A;  . Dilation and curettage of uterus     Family History  Problem Relation Age of Onset  . Heart disease Father    Social History  Substance Use Topics  . Smoking status: Never Smoker   . Smokeless tobacco: Never Used  . Alcohol Use: No   OB History    Gravida Para Term Preterm AB TAB SAB Ectopic Multiple Living   0  0 0 3     Review of Systems  Constitutional: Negative for fever and chills.  HENT: Negative for drooling, facial swelling and trouble swallowing.        Negative for tongue swelling or throat swelling  Eyes:       + Upper eyelid swelling bilaterally  Respiratory: Negative for shortness of breath.   Cardiovascular: Negative for chest pain.  Gastrointestinal: Negative for nausea, vomiting, abdominal pain,  diarrhea and constipation.  Genitourinary: Negative for dysuria and hematuria.  Musculoskeletal: Negative for myalgias and arthralgias.  Skin: Positive for rash.       + Itchy red rash to torso and arms  Negative for drainage or induration  Allergic/Immunologic: Negative for immunocompromised state.  Neurological: Negative for weakness and numbness.       + Tingling to left 2nd and 3rd digits  Psychiatric/Behavioral: Negative for confusion.   A complete 10 system review of systems was obtained and all systems are negative except as noted in the HPI and PMH.   Allergies  Augmentin and Latex  Home Medications   Prior to Admission medications   Medication Sig Start Date End Date Taking? Authorizing Provider  acetaminophen (TYLENOL) 325 MG tablet Take 650 mg by mouth every 6 (six) hours as needed for headache.    Historical Provider, MD  aspirin-acetaminophen-caffeine (EXCEDRIN MIGRAINE) [phone] MG tablet Take 1 tablet by mouth every 6 (six) hours as needed for headache.    Historical Provider, MD  ibuprofen (ADVIL,MOTRIN) 200 MG tablet Take 200 mg by mouth every 6 (six) hours as needed for headache.    Historical Provider, MD  ibuprofen (ADVIL,MOTRIN) 600 MG tablet Take 1 tablet (600 mg total) by mouth every 6 (six) hours as needed for mild pain. Patient not taking: Reported on 05/01/2015 01/19/15   Brock Bad, MD  ketorolac (TORADOL) 10 MG tablet Take 1 tablet (10 mg total) by mouth every 8 (eight) hours as needed. 05/01/15   Joycie Peek, PA-C  metoCLOPramide (REGLAN) 10 MG tablet Take 1 tablet (10 mg total) by mouth every 6 (six) hours. 05/01/15   Joycie Peek, PA-C  oxyCODONE-acetaminophen (PERCOCET/ROXICET) 5-325 MG per tablet Take 1-2 tablets by mouth every 4 (four) hours as needed for moderate pain or severe pain (for pain scale greater than 7). Patient not taking: Reported on 05/01/2015 01/19/15   Brock Bad, MD   BP 123/67 mmHg  Pulse 88  Temp(Src) 98.6 F  (37 C) (Oral)  Resp 20  SpO2 99%  LMP 08/04/2015   Physical Exam  Constitutional: She is oriented to person, place, and time. Vital signs are normal. She appears well-developed and well-nourished.  Non-toxic appearance. No distress.  Afebrile, nontoxic, NAD  HENT:  Head: Normocephalic and atraumatic.  Mouth/Throat: Mucous membranes are normal.  Airway patent with no angioedema or facial swelling  Eyes: Conjunctivae and EOM are normal. Right eye exhibits no discharge. Left eye exhibits no discharge.  Neck: Normal range of motion. Neck supple.  Cardiovascular: Normal rate.   Pulmonary/Chest: Effort normal. No respiratory distress.  Abdominal: Normal appearance. She exhibits no distension.  Musculoskeletal: Normal range of motion.  MAE x4 Strength and sensation grossly intact Distal pulses intact Gait steady Left grip strength intact with sensation to distal fingertips intact although pt reports tingling  Neurological: She is alert and oriented to person, place, and time. She has normal strength. No sensory deficit.  Skin: Skin is warm and dry. Rash noted. Rash is urticarial.  Urticarial  rash to the lower back which is mildly erythematous, non TTP without fluctuance or induration, no evidence of cellulitis, no burrowing, no interdigital web space involvement. Excoriation marks to the upper arms bilaterally.   Psychiatric: She has a normal mood and affect. Her behavior is normal.  Nursing note and vitals reviewed.   ED Course  Procedures (including critical care time)  DIAGNOSTIC STUDIES: Oxygen Saturation is 99% on RA, normal by my interpretation.    COORDINATION OF CARE: 6:14 PM-Discussed treatment plan with pt at bedside and pt agreed to plan.   Labs Review Labs Reviewed - No data to display  Imaging Review No results found.   EKG Interpretation None      MDM   Final diagnoses:  Urticaria  Rash    25 y.o. female here with rash/hives that began this morning and  have improved. Unknown etiology but states she thinks she could have come into contact with a mouse's droppings. No burrowing or evidence of scabies. No signs of bacterial or yeast infection. Likely just hives. Discussed use of hydrocortisone cream and hydroxyzine for symptoms, f/up with PCP in 1wk for recheck. Airway patent, doubt anaphylactic reaction. Doubt need for rabies vaccines. I explained the diagnosis and have given explicit precautions to return to the ER including for any other new or worsening symptoms. The patient understands and accepts the medical plan as it's been dictated and I have answered their questions. Discharge instructions concerning home care and prescriptions have been given. The patient is STABLE and is discharged to home in good condition.   I personally performed the services described in this documentation, which was scribed in my presence. The recorded information has been reviewed and is accurate.  BP 123/67 mmHg  Pulse 88  Temp(Src) 98.6 F (37 C) (Oral)  Resp 20  SpO2 99%  LMP 08/04/2015  Meds ordered this encounter  Medications  . hydrocortisone 2.5 % cream    Sig: Apply topically 2 (two) times daily as needed (itching).    Dispense:  30 g    Refill:  0    Order Specific Question:  Supervising Provider    Answer:  MILLER, BRIAN [[id number]]  . hydrOXYzine (ATARAX/VISTARIL) 25 MG tablet    Sig: Take 1 tablet (25 mg total) by mouth every 6 (six) hours as needed for itching.    Dispense:  28 tablet    Refill:  0    Order Specific Question:  Supervising Provider    Answer:  Eber HongMILLER, BRIAN [[id number]]       Ahlijah Raia Camprubi-Soms, PA-C 09/06/15 1824  Blane OharaJoshua Zavitz, MD 09/07/15 [id number]

## 2015-09-27 ENCOUNTER — Inpatient Hospital Stay (HOSPITAL_COMMUNITY)
Admission: AD | Admit: 2015-09-27 | Discharge: 2015-09-27 | Disposition: A | Discharge status: Home / Self Care | Payer: Medicaid Other | Source: Ambulatory Visit | Attending: Obstetrics and Gynecology | Admitting: Obstetrics and Gynecology

## 2015-09-27 DIAGNOSIS — N912 Amenorrhea, unspecified: Secondary | ICD-10-CM

## 2015-09-27 NOTE — MAU Note (Signed)
Judeth HornErin Lawrence NP in Triage to see pt and discuss plan of care. Pt then d/c home from Triage

## 2015-09-27 NOTE — MAU Provider Note (Signed)
Ms.Lasheena Reita ClicheJ White is a 25 y.o. [id number] at Unknown who presents to MAU today for pregnancy verification. The patient denies abdominal pain or vaginal bleeding today. LMP 08/03/15. Took HPT 2 weeks ago that was negative.  BP 132/75 mmHg  Pulse 91  Temp(Src) 98.2 F (36.8 C)  Resp 20  Ht 5' (1.524 m)  Wt 178 lb 12.8 oz (81.103 kg)  BMI 34.92 kg/m2  LMP 08/03/2015  CONSTITUTIONAL: Well-developed, well-nourished female in no acute distress.  CARDIOVASCULAR: Regular heart rate RESPIRATORY: Normal effort NEUROLOGICAL: Alert and oriented to person, place, and time.  SKIN: Skin is warm and dry. No rash noted. Not diaphoretic. No erythema. No pallor. PSYCH: Normal mood and affect. Normal behavior. Normal judgment and thought content.  MDM Medical Screen Exam Complete  A: Amenorrhea  P: Discharge from MAU Patient advised to follow-up with [facility] for pregnancy confirmation  Monday-Thursday 8am-4pm or Friday 8am-11am Patient may return to MAU as needed or if her condition were to change or worsen   Judeth HornErin Trennon Torbeck, NP  09/27/2015 8:36 PM

## 2015-09-27 NOTE — MAU Note (Signed)
I have not had a period since March 25. Took UPT 2 wks ago and was negative. Just want to know if I am pregnant. Denies pain or vag d/c. Just wants to know if is preg and if not, why her period has not started

## 2015-09-30 ENCOUNTER — Ambulatory Visit (INDEPENDENT_AMBULATORY_CARE_PROVIDER_SITE_OTHER): Payer: Medicaid Other

## 2015-09-30 DIAGNOSIS — Z3201 Encounter for pregnancy test, result positive: Secondary | ICD-10-CM

## 2015-09-30 DIAGNOSIS — Z3200 Encounter for pregnancy test, result unknown: Secondary | ICD-10-CM

## 2015-09-30 LAB — POCT PREGNANCY, URINE: Preg Test, Ur: POSITIVE — AB

## 2015-09-30 NOTE — Progress Notes (Signed)
Patient ID: Anita Allen, female   DOB: 1991/01/22, 25 y.o.   MRN: [id number] [facility]. The test resulted POSITIVE. pt states her last LMP was 08/03/15. Pt denies pain and bleeding and plans to come here([facility]) for her prenatal care.    According to LMP: EDD:05/09/16 [id number]

## 2015-10-31 ENCOUNTER — Encounter: Payer: Self-pay | Admitting: Obstetrics & Gynecology

## 2016-01-31 ENCOUNTER — Emergency Department (HOSPITAL_COMMUNITY)
Admission: EM | Admit: 2016-01-31 | Discharge: 2016-01-31 | Disposition: A | Discharge status: Home / Self Care | Payer: Self-pay | Attending: Emergency Medicine | Admitting: Emergency Medicine

## 2016-01-31 ENCOUNTER — Encounter (HOSPITAL_COMMUNITY): Payer: Self-pay

## 2016-01-31 DIAGNOSIS — J020 Streptococcal pharyngitis: Secondary | ICD-10-CM | POA: Insufficient documentation

## 2016-01-31 DIAGNOSIS — Z7982 Long term (current) use of aspirin: Secondary | ICD-10-CM | POA: Insufficient documentation

## 2016-01-31 LAB — RAPID STREP SCREEN (MED CTR MEBANE ONLY): STREPTOCOCCUS, GROUP A SCREEN (DIRECT): POSITIVE — AB

## 2016-01-31 MED ORDER — BENZONATATE 100 MG PO CAPS: 200.0000 mg | ORAL_CAPSULE | Freq: Two times a day (BID) | ORAL | 0 refills | Status: DC | PRN

## 2016-01-31 MED ORDER — ACETAMINOPHEN 325 MG PO TABS
650.0000 mg | ORAL_TABLET | Freq: Once | ORAL | Status: AC | PRN
  Administered 2016-01-31: 650 mg via ORAL

## 2016-01-31 MED ORDER — ACETAMINOPHEN 325 MG PO TABS
ORAL_TABLET | ORAL | Status: AC
  Filled 2016-01-31: qty 2

## 2016-01-31 MED ORDER — CLINDAMYCIN HCL 150 MG PO CAPS: 300.0000 mg | ORAL_CAPSULE | Freq: Three times a day (TID) | ORAL | 0 refills | Status: AC

## 2016-01-31 MED ORDER — OXYMETAZOLINE HCL 0.05 % NA SOLN: 1.0000 | Freq: Two times a day (BID) | NASAL | 0 refills | Status: DC
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
## 2016-01-31 NOTE — ED Triage Notes (Signed)
Pt reports sore throat, bilateral ear aches, generalized body aches, congestion, dry cough, headache and fever that started this morning.

## 2016-01-31 NOTE — Discharge Instructions (Signed)
Take your medications as prescribed. Continue drinking fluids at home to remain hydrated. Continue taking tylenol and/or ibuprofen as prescribed over the counter as needed for pain relief. You are still contagious for the next 24 hours after starting your antibiotic. Follow up with your primary care provider in 7 days if your symptoms have not improved. Please return to the Emergency Department if symptoms worsen or new onset of fever, neck swelling, difficulty breathing, drooling, difficulty swallowing.

## 2016-05-06 ENCOUNTER — Emergency Department (HOSPITAL_COMMUNITY): Payer: Self-pay

## 2016-05-06 ENCOUNTER — Encounter (HOSPITAL_COMMUNITY): Payer: Self-pay | Admitting: Emergency Medicine

## 2016-05-06 ENCOUNTER — Emergency Department (HOSPITAL_COMMUNITY)
Admission: EM | Admit: 2016-05-06 | Discharge: 2016-05-06 | Disposition: A | Discharge status: Home / Self Care | Payer: Self-pay | Attending: Emergency Medicine | Admitting: Emergency Medicine

## 2016-05-06 DIAGNOSIS — T07XXXA Unspecified multiple injuries, initial encounter: Secondary | ICD-10-CM

## 2016-05-06 DIAGNOSIS — S60222A Contusion of left hand, initial encounter: Secondary | ICD-10-CM | POA: Insufficient documentation

## 2016-05-06 DIAGNOSIS — Z794 Long term (current) use of insulin: Secondary | ICD-10-CM | POA: Insufficient documentation

## 2016-05-06 DIAGNOSIS — Y929 Unspecified place or not applicable: Secondary | ICD-10-CM | POA: Insufficient documentation

## 2016-05-06 DIAGNOSIS — Y999 Unspecified external cause status: Secondary | ICD-10-CM | POA: Insufficient documentation

## 2016-05-06 DIAGNOSIS — R0781 Pleurodynia: Secondary | ICD-10-CM | POA: Insufficient documentation

## 2016-05-06 DIAGNOSIS — Y939 Activity, unspecified: Secondary | ICD-10-CM | POA: Insufficient documentation

## 2016-05-06 DIAGNOSIS — S0083XA Contusion of other part of head, initial encounter: Secondary | ICD-10-CM | POA: Insufficient documentation

## 2016-05-06 DIAGNOSIS — S60221A Contusion of right hand, initial encounter: Secondary | ICD-10-CM | POA: Insufficient documentation

## 2016-05-06 MED ORDER — CYCLOBENZAPRINE HCL 10 MG PO TABS: 10.0000 mg | ORAL_TABLET | Freq: Two times a day (BID) | ORAL | 0 refills | Status: DC | PRN

## 2016-05-06 MED ORDER — IBUPROFEN 200 MG PO TABS
600.0000 mg | ORAL_TABLET | Freq: Once | ORAL | Status: AC
  Administered 2016-05-06: 600 mg via ORAL
  Filled 2016-05-06: qty 1

## 2016-05-06 MED ORDER — NAPROXEN 500 MG PO TABS: 500.0000 mg | ORAL_TABLET | Freq: Two times a day (BID) | ORAL | 0 refills | Status: DC

## 2016-05-06 MED ORDER — ACETAMINOPHEN 500 MG PO TABS
1000.0000 mg | ORAL_TABLET | Freq: Once | ORAL | Status: AC
  Administered 2016-05-06: 1000 mg via ORAL
  Filled 2016-05-06: qty 2
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
## 2016-06-05 ENCOUNTER — Emergency Department (HOSPITAL_COMMUNITY)
Admission: EM | Admit: 2016-06-05 | Discharge: 2016-06-06 | Disposition: A | Discharge status: Home / Self Care | Payer: Self-pay | Attending: Emergency Medicine | Admitting: Emergency Medicine

## 2016-06-05 DIAGNOSIS — R509 Fever, unspecified: Secondary | ICD-10-CM | POA: Insufficient documentation

## 2016-06-05 DIAGNOSIS — Z7982 Long term (current) use of aspirin: Secondary | ICD-10-CM | POA: Insufficient documentation

## 2016-06-05 DIAGNOSIS — J029 Acute pharyngitis, unspecified: Secondary | ICD-10-CM | POA: Insufficient documentation

## 2016-06-05 DIAGNOSIS — Z79899 Other long term (current) drug therapy: Secondary | ICD-10-CM | POA: Insufficient documentation

## 2016-06-05 DIAGNOSIS — R6889 Other general symptoms and signs: Secondary | ICD-10-CM

## 2016-06-05 DIAGNOSIS — Z91040 Latex allergy status: Secondary | ICD-10-CM | POA: Insufficient documentation

## 2016-06-05 MED ORDER — ACETAMINOPHEN 325 MG PO TABS
650.0000 mg | ORAL_TABLET | Freq: Once | ORAL | Status: AC | PRN
  Administered 2016-06-05: 650 mg via ORAL
  Filled 2016-06-05: qty 2

## 2016-06-05 NOTE — ED Triage Notes (Signed)
Pt here for uri, headache, sorethroat, runny nose. Has taken multiple OTC meds with no relief. 2 children had flu last week.

## 2016-06-06 LAB — RAPID STREP SCREEN (MED CTR MEBANE ONLY): Streptococcus, Group A Screen (Direct): NEGATIVE

## 2016-06-06 NOTE — ED Notes (Signed)
Pt verbalized understanding discharge instructions and denies any further needs or questions at this time. VS stable, ambulatory and steady gait.   

## 2016-06-08 LAB — CULTURE, GROUP A STREP (THRC)
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
## 2016-08-26 ENCOUNTER — Encounter (HOSPITAL_COMMUNITY): Payer: Self-pay | Admitting: *Deleted

## 2016-08-26 DIAGNOSIS — Z79899 Other long term (current) drug therapy: Secondary | ICD-10-CM | POA: Insufficient documentation

## 2016-08-26 DIAGNOSIS — Z91040 Latex allergy status: Secondary | ICD-10-CM | POA: Insufficient documentation

## 2016-08-26 DIAGNOSIS — J029 Acute pharyngitis, unspecified: Secondary | ICD-10-CM | POA: Insufficient documentation

## 2016-08-26 DIAGNOSIS — Z7982 Long term (current) use of aspirin: Secondary | ICD-10-CM | POA: Insufficient documentation

## 2016-08-26 LAB — RAPID STREP SCREEN (MED CTR MEBANE ONLY): STREPTOCOCCUS, GROUP A SCREEN (DIRECT): NEGATIVE

## 2016-08-26 MED ORDER — ACETAMINOPHEN 325 MG PO TABS
650.0000 mg | ORAL_TABLET | Freq: Once | ORAL | Status: AC
  Administered 2016-08-26: 650 mg via ORAL
  Filled 2016-08-26: qty 2

## 2016-08-26 NOTE — ED Triage Notes (Signed)
The pt is c/o total body aches headache sore throat chills unknown temp   lmp birth control  Her symptoms started this am  No fever reducer taken

## 2016-08-27 ENCOUNTER — Emergency Department (HOSPITAL_COMMUNITY)
Admission: EM | Admit: 2016-08-27 | Discharge: 2016-08-27 | Disposition: A | Discharge status: Home / Self Care | Payer: Self-pay | Attending: Emergency Medicine | Admitting: Emergency Medicine

## 2016-08-27 DIAGNOSIS — R6889 Other general symptoms and signs: Secondary | ICD-10-CM

## 2016-08-27 DIAGNOSIS — J029 Acute pharyngitis, unspecified: Secondary | ICD-10-CM

## 2016-08-27 LAB — BASIC METABOLIC PANEL
Anion gap: 9 (ref 5–15)
BUN: 6 mg/dL (ref 6–20)
CO2: 21 mmol/L — ABNORMAL LOW (ref 22–32)
CREATININE: 0.8 mg/dL (ref 0.44–1.00)
Calcium: 10 mg/dL (ref 8.9–10.3)
Chloride: 102 mmol/L (ref 101–111)
GFR calc Af Amer: >60 mL/min (ref >60)
GLUCOSE: 93 mg/dL (ref 65–99)
Potassium: 3.8 mmol/L (ref 3.5–5.1)
SODIUM: 132 mmol/L — AB (ref 135–145)

## 2016-08-27 LAB — CBC WITH DIFFERENTIAL/PLATELET
BASOS ABS: 0.1 10*3/uL (ref 0.0–0.1)
Basophils Relative: 0 %
EOS ABS: 0 10*3/uL (ref 0.0–0.7)
EOS PCT: 0 %
HCT: 42.7 % (ref 36.0–46.0)
Hemoglobin: 14 g/dL (ref 12.0–15.0)
LYMPHS ABS: 1.8 10*3/uL (ref 0.7–4.0)
Lymphocytes Relative: 9 %
MCH: 28.1 pg (ref 26.0–34.0)
MCHC: 32.8 g/dL (ref 30.0–36.0)
MCV: 85.7 fL (ref 78.0–100.0)
MONO ABS: 1.2 10*3/uL — AB (ref 0.1–1.0)
Monocytes Relative: 6 %
Neutro Abs: 17.2 10*3/uL — ABNORMAL HIGH (ref 1.7–7.7)
Neutrophils Relative %: 85 %
PLATELETS: 329 10*3/uL (ref 150–400)
RBC: 4.98 MIL/uL (ref 3.87–5.11)
RDW: 13.7 % (ref 11.5–15.5)
WBC: 20.2 10*3/uL — AB (ref 4.0–10.5)

## 2016-08-27 MED ORDER — AZITHROMYCIN 500 MG PO TABS: 500.0000 mg | ORAL_TABLET | Freq: Every day | ORAL | 0 refills | Status: DC

## 2016-08-27 MED ORDER — DEXAMETHASONE SODIUM PHOSPHATE 10 MG/ML IJ SOLN
10.0000 mg | Freq: Once | INTRAMUSCULAR | Status: AC
  Administered 2016-08-27: 10 mg via INTRAMUSCULAR
  Filled 2016-08-27: qty 1

## 2016-08-27 MED ORDER — SODIUM CHLORIDE 0.9 % IV BOLUS (SEPSIS): 1000.0000 mL | Freq: Once | INTRAVENOUS | Status: DC

## 2016-08-27 MED ORDER — AZITHROMYCIN 250 MG PO TABS
500.0000 mg | ORAL_TABLET | Freq: Once | ORAL | Status: DC
  Filled 2016-08-27: qty 2

## 2016-08-27 MED ORDER — KETOROLAC TROMETHAMINE 30 MG/ML IJ SOLN
30.0000 mg | Freq: Once | INTRAMUSCULAR | Status: AC
  Administered 2016-08-27: 30 mg via INTRAMUSCULAR
  Filled 2016-08-27: qty 1

## 2016-08-27 NOTE — Discharge Instructions (Signed)
Your strep test was negative. However, I am going to give you antibiotics to cover for strep. You may also have the flu. Make sure you are staying hydrated with plenty of fluids. May take motrin and tylenol for pain. Follow up with a primary care doctor. Return to the ED if your symptoms worsen.

## 2016-08-27 NOTE — ED Notes (Signed)
Pt continues to be unable to be found.

## 2016-08-27 NOTE — ED Notes (Signed)
Pt not in room at this time

## 2016-08-28 ENCOUNTER — Emergency Department (HOSPITAL_COMMUNITY)
Admission: EM | Admit: 2016-08-28 | Discharge: 2016-08-28 | Disposition: A | Discharge status: Home / Self Care | Payer: Self-pay | Attending: Physician Assistant | Admitting: Physician Assistant

## 2016-08-28 ENCOUNTER — Encounter (HOSPITAL_COMMUNITY): Payer: Self-pay | Admitting: Vascular Surgery

## 2016-08-28 DIAGNOSIS — L03111 Cellulitis of right axilla: Secondary | ICD-10-CM | POA: Insufficient documentation

## 2016-08-28 DIAGNOSIS — Z79899 Other long term (current) drug therapy: Secondary | ICD-10-CM | POA: Insufficient documentation

## 2016-08-28 DIAGNOSIS — Z7982 Long term (current) use of aspirin: Secondary | ICD-10-CM | POA: Insufficient documentation

## 2016-08-28 DIAGNOSIS — Z91040 Latex allergy status: Secondary | ICD-10-CM | POA: Insufficient documentation

## 2016-08-28 DIAGNOSIS — Z09 Encounter for follow-up examination after completed treatment for conditions other than malignant neoplasm: Secondary | ICD-10-CM

## 2016-08-28 LAB — CULTURE, GROUP A STREP (THRC)

## 2016-08-28 MED ORDER — CEPHALEXIN 500 MG PO CAPS: 500.0000 mg | ORAL_CAPSULE | Freq: Four times a day (QID) | ORAL | 0 refills | Status: DC

## 2016-08-28 NOTE — ED Triage Notes (Signed)
Pt reports to the ED for her test results she states that she came up here 2 days ago and she had a strep test and had blood drawn. She also got a steroid and Toradol shot. She states that she had to leave before she was able to get her test results. She states that she looked at her mychart and she could not see her results she and has gone to medical records but they said it would be a 30 minute. Her symptoms have improved.

## 2016-08-28 NOTE — Discharge Instructions (Signed)
Medications: Keflex  Treatment: Take Keflex as prescribed. Make sure to finish all this medication. Use warm compresses 3-4 times daily on your armpit.   Follow-up: Please return to the emergency department if you develop any new or worsening symptoms including fever, increasing pain, redness, swelling, red streaking from your armpit. Please establish care with a primary care provider by calling the number circled on your discharge paperwork.
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
## 2016-08-29 ENCOUNTER — Telehealth: Payer: Self-pay
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
## 2016-08-29 NOTE — Telephone Encounter (Signed)
Post ED Visit - Positive Culture Follow-up  Culture report reviewed by antimicrobial stewardship pharmacist:   Enzo Bi, Pharm.D.  Celedonio Miyamoto, Pharm.D., BCPS AQ-ID  Garvin Fila, Pharm.D., BCPS  Georgina Pillion, Pharm.D., BCPS  [place], [address].D., BCPS, AAHIVP  Estella Husk, Pharm.D., BCPS, AAHIVP  Lysle Pearl, PharmD, BCPS  Casilda Carls, PharmD, BCPS  Pollyann Samples, PharmD, BCPS  Positive strep culture Treated with Azithromycin, organism sensitive to the same and no further patient follow-up is required at this time.  Jerry Caras 08/29/2016, 10:08 AM

## 2016-10-05 ENCOUNTER — Ambulatory Visit (HOSPITAL_COMMUNITY)
Admission: EM | Admit: 2016-10-05 | Discharge: 2016-10-05 | Disposition: A | Discharge status: Home / Self Care | Payer: Self-pay | Attending: Family Medicine | Admitting: Family Medicine

## 2016-10-05 ENCOUNTER — Encounter (HOSPITAL_COMMUNITY): Payer: Self-pay | Admitting: *Deleted

## 2016-10-05 DIAGNOSIS — Z79899 Other long term (current) drug therapy: Secondary | ICD-10-CM | POA: Insufficient documentation

## 2016-10-05 DIAGNOSIS — B3731 Acute candidiasis of vulva and vagina: Secondary | ICD-10-CM

## 2016-10-05 DIAGNOSIS — Z7982 Long term (current) use of aspirin: Secondary | ICD-10-CM | POA: Insufficient documentation

## 2016-10-05 DIAGNOSIS — B373 Candidiasis of vulva and vagina: Secondary | ICD-10-CM | POA: Insufficient documentation

## 2016-10-05 DIAGNOSIS — Z880 Allergy status to penicillin: Secondary | ICD-10-CM | POA: Insufficient documentation

## 2016-10-05 HISTORY — DX: Other specified bacterial agents as the cause of diseases classified elsewhere: N76.0

## 2016-10-05 HISTORY — DX: Candidiasis, unspecified: B37.9

## 2016-10-05 HISTORY — DX: Acute vaginitis: N76.0

## 2016-10-05 HISTORY — DX: Other specified bacterial agents as the cause of diseases classified elsewhere: B96.89

## 2016-10-05 LAB — POCT PREGNANCY, URINE: Preg Test, Ur: NEGATIVE

## 2016-10-05 MED ORDER — FLUCONAZOLE 150 MG PO TABS: 150.0000 mg | ORAL_TABLET | Freq: Every day | ORAL | 0 refills | Status: AC

## 2016-10-05 NOTE — ED Triage Notes (Signed)
Pt most recently in Apr 2018 was taking meds for suppression of frequent yeast infections and BV.  States this feels like her normal yeast infection.

## 2016-10-05 NOTE — ED Provider Notes (Signed)
CSN: [id number]     Arrival date & time 10/05/16  1031 History   First MD Initiated Contact with Patient 10/05/16 1134     Chief Complaint  Patient presents with  . Vaginal Itching  . Insect Bite   (Consider location/radiation/quality/duration/timing/severity/associated sxs/prior Treatment) Patient is a 26 y.o. Female, here for possible yeast infection. She reports 2-day duration of vaginal itchiness accompany by burning and irritation secondary to excessive scratching. She denies discharge. She denies vaginal odor. She denies urinary symptoms. She is sexually active with 1 partner. She denies pelvic pain, abd pain or nausea or fever.        Past Medical History:  Diagnosis Date  . BV (bacterial vaginosis)   . Gonorrhea contact, treated   . H/O dilation and curettage   . Molar pregnancy 12-2013  . No pertinent past medical history   . Vaginal delivery 2012, 2013  . Yeast infection    Past Surgical History:  Procedure Laterality Date  . DILATION AND CURETTAGE OF UTERUS    . DILATION AND EVACUATION N/A 12/20/2013   Procedure: DILATATION AND EVACUATION;  Surgeon: Adam PhenixJames G Arnold, MD;  Location: [facility];  Service: Gynecology;  Laterality: N/A;   Family History  Problem Relation Age of Onset  . Heart disease Father    Social History  Substance Use Topics  . Smoking status: Never Smoker  . Smokeless tobacco: Never Used  . Alcohol use No   OB History    Gravida Para Term Preterm AB Living   5 3 3  0 2 3   SAB TAB Ectopic Multiple Live Births   1 1 0 0 3     Review of Systems  Constitutional:       As stated in the history of present illness    Allergies  Augmentin [amoxicillin-pot clavulanate] and Latex  Home Medications   Prior to Admission medications   Medication Sig Start Date End Date Taking? Authorizing Provider  acetaminophen (TYLENOL) 325 MG tablet Take 650 mg by mouth every 6 (six) hours as needed for headache. Reported on 09/30/2015    [provider]  aspirin-acetaminophen-caffeine (EXCEDRIN MIGRAINE) [phone] MG tablet Take 1 tablet by mouth every 6 (six) hours as needed for headache. Reported on 09/30/2015    [provider]  azithromycin (ZITHROMAX) 500 MG tablet Take 1 tablet (500 mg total) by mouth daily. 08/27/16   Rise MuLeaphart, Kenneth T, PA-C  benzonatate (TESSALON) 100 MG capsule Take 2 capsules (200 mg total) by mouth 2 (two) times daily as needed for cough. 01/31/16   Barrett HenleNadeau, Nicole Elizabeth, PA-C  cephALEXin (KEFLEX) 500 MG capsule Take 1 capsule (500 mg total) by mouth 4 (four) times daily. 08/28/16   Law, Waylan BogaAlexandra M, PA-C  cyclobenzaprine (FLEXERIL) 10 MG tablet Take 1 tablet (10 mg total) by mouth 2 (two) times daily as needed for muscle spasms. 05/06/16   Gwyneth SproutPlunkett, Whitney, MD  fluconazole (DIFLUCAN) 150 MG tablet Take 1 tablet (150 mg total) by mouth daily. Take 1 now; repeat in 72 hrs 10/05/16 10/07/16  Lucia EstelleZheng, Elaynah Virginia, NP  hydrocortisone 2.5 % cream Apply topically 2 (two) times daily as needed (itching). Patient not taking: Reported on 09/30/2015 09/06/15   Street, RobinhoodMercedes, PA-C  hydrOXYzine (ATARAX/VISTARIL) 25 MG tablet Take 1 tablet (25 mg total) by mouth every 6 (six) hours as needed for itching. Patient not taking: Reported on 09/30/2015 09/06/15   Street, HattonMercedes, PA-C  ibuprofen (ADVIL,MOTRIN) 200 MG tablet Take 200 mg by mouth every 6 (  six) hours as needed for headache. Reported on 09/30/2015    [provider]  ibuprofen (ADVIL,MOTRIN) 600 MG tablet Take 1 tablet (600 mg total) by mouth every 6 (six) hours as needed for mild pain. Patient not taking: Reported on 05/01/2015 01/19/15   Brock Bad, MD  ketorolac (TORADOL) 10 MG tablet Take 1 tablet (10 mg total) by mouth every 8 (eight) hours as needed. Patient not taking: Reported on 09/30/2015 05/01/15   Joycie Peek, PA-C  metoCLOPramide (REGLAN) 10 MG tablet Take 1 tablet (10 mg total) by mouth every 6 (six) hours. Patient not taking: Reported on  09/30/2015 05/01/15   Joycie Peek, PA-C  naproxen (NAPROSYN) 500 MG tablet Take 1 tablet (500 mg total) by mouth 2 (two) times daily with a meal. 05/06/16   [name], MD  oxyCODONE-acetaminophen (PERCOCET/ROXICET) 5-325 MG per tablet Take 1-2 tablets by mouth every 4 (four) hours as needed for moderate pain or severe pain (for pain scale greater than 7). Patient not taking: Reported on 05/01/2015 01/19/15   Brock Bad, MD  oxymetazoline Vickie Epley NASAL SPRAY) 0.05 % nasal spray Place 1 spray into both nostrils 2 (two) times daily. Do not take longer than 3 days to prevent rebound rhinorrhea 01/31/16   Barrett Henle, PA-C   Meds Ordered and Administered this Visit  Medications - No data to display  BP 110/81   Pulse 81   Temp 98.3 F (36.8 C) (Oral)   Resp 14   SpO2 98%   Breastfeeding? No Comment: States no menses since going off BCPs 1.5 mo ago No data found.   Physical Exam  Constitutional: She is oriented to person, place, and time. She appears well-developed and well-nourished.  Cardiovascular: Normal rate, regular rhythm and normal heart sounds.   Pulmonary/Chest: Effort normal and breath sounds normal. She has no wheezes.  Abdominal: Soft. Bowel sounds are normal. There is no tenderness.  Genitourinary:  Genitourinary Comments: Labia majora and minor symmetrical with no lesion. Small amt of white cottage cheese discharge noted at the vaginal entry. More of similar discharge noted in the vaginal canal. Canal otherwise with no lesion. -CMT, -adnexal mass or tenderness  Neurological: She is alert and oriented to person, place, and time.  Skin: Skin is warm and dry.  Nursing note and vitals reviewed.   Urgent Care Course     Procedures (including critical care time)  Labs Review Labs Reviewed  POCT PREGNANCY, URINE  CERVICOVAGINAL ANCILLARY ONLY    Imaging Review No results found.  MDM   1. Vaginal yeast infection    Vaginal discharge  consistent with yeast infection. Cytology pending for GC/CT, trich, BV and yeast. Will treat presumptively today for yeast infection with diflucan now; repeat in 72 hrs. Instructed to f/u with PCP for no improvement.     Lucia Estelle, NP 10/05/16 [phone]

## 2016-10-05 NOTE — ED Triage Notes (Signed)
C/O starting 2 days ago with slight vaginal discharge "only when I wipe" and significant vaginal itching and irritation, now with external swelling.  Also c/o 3 small lesions to LUE she believes to be spider bite.

## 2016-10-07 LAB — CERVICOVAGINAL ANCILLARY ONLY
BACTERIAL VAGINITIS: NEGATIVE
CANDIDA VAGINITIS: POSITIVE — AB
CHLAMYDIA, DNA PROBE: NEGATIVE
Neisseria Gonorrhea: NEGATIVE
Trichomonas: NEGATIVE

## 2016-11-15 ENCOUNTER — Emergency Department (HOSPITAL_COMMUNITY)
Admission: EM | Admit: 2016-11-15 | Discharge: 2016-11-15 | Disposition: A | Discharge status: Home / Self Care | Payer: Self-pay | Attending: Emergency Medicine | Admitting: Emergency Medicine

## 2016-11-15 ENCOUNTER — Encounter (HOSPITAL_COMMUNITY): Payer: Self-pay | Admitting: Emergency Medicine

## 2016-11-15 DIAGNOSIS — L02413 Cutaneous abscess of right upper limb: Secondary | ICD-10-CM | POA: Insufficient documentation

## 2016-11-15 DIAGNOSIS — Z5321 Procedure and treatment not carried out due to patient leaving prior to being seen by health care provider: Secondary | ICD-10-CM | POA: Insufficient documentation

## 2016-11-15 NOTE — ED Notes (Signed)
Called patient to room and no answer. 

## 2016-11-15 NOTE — ED Notes (Signed)
Bed: [facility]1 Expected date:  Expected time:  Means of arrival:  Comments: 

## 2016-11-15 NOTE — ED Triage Notes (Signed)
Patient complaining of an abscess under right arm pit. Patient stated it started a week ago. Patient states it is very painful.

## 2016-11-15 NOTE — ED Notes (Signed)
Bed: WTR8 Expected date:  Expected time:  Means of arrival:  Comments: 

## 2016-11-15 NOTE — ED Notes (Signed)
Registration stated patient left. 

## 2016-11-16 ENCOUNTER — Emergency Department (HOSPITAL_COMMUNITY)
Admission: EM | Admit: 2016-11-16 | Discharge: 2016-11-16 | Disposition: A | Discharge status: Home / Self Care | Payer: Self-pay | Attending: Emergency Medicine | Admitting: Emergency Medicine

## 2016-11-16 ENCOUNTER — Encounter (HOSPITAL_COMMUNITY): Payer: Self-pay | Admitting: Emergency Medicine

## 2016-11-16 DIAGNOSIS — L02411 Cutaneous abscess of right axilla: Secondary | ICD-10-CM | POA: Insufficient documentation

## 2016-11-16 DIAGNOSIS — Z79899 Other long term (current) drug therapy: Secondary | ICD-10-CM | POA: Insufficient documentation

## 2016-11-16 DIAGNOSIS — Z880 Allergy status to penicillin: Secondary | ICD-10-CM | POA: Insufficient documentation

## 2016-11-16 DIAGNOSIS — Z91040 Latex allergy status: Secondary | ICD-10-CM | POA: Insufficient documentation

## 2016-11-16 MED ORDER — LIDOCAINE-EPINEPHRINE (PF) 2 %-1:200000 IJ SOLN
10.0000 mL | Freq: Once | INTRAMUSCULAR | Status: AC
  Administered 2016-11-16: 10 mL
  Filled 2016-11-16: qty 20

## 2016-11-16 MED ORDER — HYDROCODONE-ACETAMINOPHEN 5-325 MG PO TABS: 1.0000 | ORAL_TABLET | ORAL | 0 refills | Status: DC | PRN

## 2016-11-16 MED ORDER — OXYCODONE-ACETAMINOPHEN 5-325 MG PO TABS
1.0000 | ORAL_TABLET | Freq: Once | ORAL | Status: AC
  Administered 2016-11-16: 1 via ORAL
  Filled 2016-11-16: qty 1

## 2016-11-16 NOTE — ED Triage Notes (Signed)
Patient reports skin abscess with no drainage at right axilla for several days .
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
## 2017-02-27 ENCOUNTER — Inpatient Hospital Stay (HOSPITAL_COMMUNITY)
Admission: AD | Admit: 2017-02-27 | Discharge: 2017-02-27 | Disposition: A | Discharge status: Home / Self Care | Payer: Self-pay | Source: Ambulatory Visit | Attending: Obstetrics and Gynecology | Admitting: Obstetrics and Gynecology

## 2017-02-27 ENCOUNTER — Encounter (HOSPITAL_COMMUNITY): Payer: Self-pay | Admitting: Advanced Practice Midwife

## 2017-02-27 DIAGNOSIS — Z7982 Long term (current) use of aspirin: Secondary | ICD-10-CM | POA: Insufficient documentation

## 2017-02-27 DIAGNOSIS — Z880 Allergy status to penicillin: Secondary | ICD-10-CM | POA: Insufficient documentation

## 2017-02-27 DIAGNOSIS — N926 Irregular menstruation, unspecified: Secondary | ICD-10-CM | POA: Insufficient documentation

## 2017-02-27 DIAGNOSIS — N946 Dysmenorrhea, unspecified: Secondary | ICD-10-CM | POA: Insufficient documentation

## 2017-02-27 DIAGNOSIS — E282 Polycystic ovarian syndrome: Secondary | ICD-10-CM | POA: Insufficient documentation

## 2017-02-27 LAB — URINALYSIS, MICROSCOPIC (REFLEX)

## 2017-02-27 LAB — URINALYSIS, ROUTINE W REFLEX MICROSCOPIC
BILIRUBIN URINE: NEGATIVE
Glucose, UA: NEGATIVE mg/dL
KETONES UR: NEGATIVE mg/dL
NITRITE: NEGATIVE
PROTEIN: 30 mg/dL — AB
Specific Gravity, Urine: >1.03 — ABNORMAL HIGH (ref 1.005–1.030)
pH: 6 (ref 5.0–8.0)

## 2017-02-27 LAB — POCT PREGNANCY, URINE: PREG TEST UR: NEGATIVE

## 2017-02-27 MED ORDER — IBUPROFEN 600 MG PO TABS: 600.0000 mg | ORAL_TABLET | Freq: Four times a day (QID) | ORAL | 1 refills | Status: DC | PRN

## 2017-02-27 NOTE — Discharge Instructions (Signed)
Abnormal Uterine Bleeding Abnormal uterine bleeding means bleeding more than usual from your uterus. It can include:  Bleeding between periods.  Bleeding after sex.  Bleeding that is heavier than normal.  Periods that last longer than usual.  Bleeding after you have stopped having your period (menopause).  There are many problems that may cause this. You should see a doctor for any kind of bleeding that is not normal. Treatment depends on the cause of the bleeding. Follow these instructions at home:  Watch your condition for any changes.  Do not use tampons, douche, or have sex, if your doctor tells you not to.  Change your pads often.  Get regular well-woman exams. Make sure they include a pelvic exam and cervical cancer screening.  Keep all follow-up visits as told by your doctor. This is important. Contact a doctor if:  The bleeding lasts more than one week.  You feel dizzy at times.  You feel like you are going to throw up (nauseous).  You throw up. Get help right away if:  You pass out.  You have to change pads every hour.  You have belly (abdominal) pain.  You have a fever.  You get sweaty.  You get weak.  You passing large blood clots from your vagina. Summary  Abnormal uterine bleeding means bleeding more than usual from your uterus.  There are many problems that may cause this. You should see a doctor for any kind of bleeding that is not normal.  Treatment depends on the cause of the bleeding. This information is not intended to replace advice given to you by your health care provider. Make sure you discuss any questions you have with your health care provider. Document Released: 02/22/2009 Document Revised: 04/21/2016 Document Reviewed: 04/21/2016 Elsevier Interactive Patient Education  2017 Elsevier Inc.   Diet for Polycystic Ovarian Syndrome Polycystic ovary syndrome (PCOS) is a disorder of the chemical messengers (hormones) that regulate  menstruation. The condition causes important hormones to be out of balance. PCOS can:  Make your periods irregular or stop.  Cause cysts to develop on the ovaries.  Make it difficult to get pregnant.  Stop your body from responding to the effects of insulin (insulin resistance), which can lead to obesity and diabetes.  Changing what you eat can help manage PCOS and improve your health. It can help you lose weight and improve the way your body uses insulin. What is my plan?  Eat breakfast, lunch, and dinner plus two snacks every day.  Include protein in each meal and snack.  Choose whole grains instead of products made with refined flour.  Eat a variety of foods.  Exercise regularly as told by your health care provider. What do I need to know about this eating plan? If you are overweight or obese, pay attention to how many calories you eat. Cutting down on calories can help you lose weight. Work with your health care provider or dietitian to figure out how many calories you need each day. What foods can I eat? Grains Whole grains, such as whole wheat. Whole-grain breads, crackers, cereals, and pasta. Unsweetened oatmeal, bulgur, barley, quinoa, or brown rice. Corn or whole-wheat flour tortillas. Vegetables  Lettuce. Spinach. Peas. Beets. Cauliflower. Cabbage. Broccoli. Carrots. Tomatoes. Squash. Eggplant. Herbs. Peppers. Onions. Cucumbers. Brussels sprouts. Fruits Berries. Bananas. Apples. Oranges. Grapes. Papaya. Mango. Pomegranate. Kiwi. Grapefruit. Cherries. Meats and Other Protein Sources Lean proteins, such as fish, chicken, beans, eggs, and tofu. Dairy Low-fat dairy products, such as skim  milk, cheese sticks, and yogurt. Beverages Low-fat or fat-free drinks, such as water, low-fat milk, sugar-free drinks, and 100% fruit juice. Condiments Ketchup. Mustard. Barbecue sauce. Relish. Low-fat or fat-free mayonnaise. Fats and Oils Olive oil or canola oil. Walnuts and  almonds. The items listed above may not be a complete list of recommended foods or beverages. Contact your dietitian for more options. What foods are not recommended? Foods high in calories or fat. Fried foods. Sweets. Products made from refined white flour, including white bread, pastries, white rice, and pasta. The items listed above may not be a complete list of foods and beverages to avoid. Contact your dietitian for more information. This information is not intended to replace advice given to you by your health care provider. Make sure you discuss any questions you have with your health care provider. Document Released: 08/19/2015 Document Revised: 10/03/2015 Document Reviewed: 05/09/2014 Elsevier Interactive Patient Education  2018 [employer].  Prenatal Care Providers [facility]    [facility]  [phone]     Phone: [phone]          [facility]                      [facility]  @[place]     Phone: [phone]  Phone: [phone]         [facility]     Phone: [phone]  Phone: [phone]           [facility] [place]                hone: [phone]  Phone: [phone]         [facility] [address] Francoise Ceo      Phone: [phone]  Phone: [phone]         [facility]                Phone: [phone]  [facility]   [address] [place])          Phone: [phone] [facility]   Phone: [phone]

## 2017-02-27 NOTE — MAU Note (Signed)
Patient states, "I was bit by a brown recluse spider about one month ago, and I have been cramping for two months." Patient reports cramping has been intermittent every day for two months. Patient reports taking Tylenol for pain relief, but that it does not relieve the pain. Patient reports that her last three periods have been light brown and abnormal. LPM was 02/25/17. Patient denies using any birth control.

## 2017-02-27 NOTE — MAU Provider Note (Signed)
Chief Complaint:  Abdominal Pain and Vaginal Bleeding   First Provider Initiated Contact with Patient 02/27/17 1833       HPI: Anita Allen is a 26 y.o. [id number] who presents to maternity admissions reporting intermittent lower abdominal cramping.  Also has brown discharge with periods.  Worried that the spider bite she had a month ago is causing her abdominal pain.  Periods are irregular.  Has been told she has PCOS before but doesn't think it is why her periods are irregular.  She reports vaginal bleeding, but vaginal itching/burning, urinary symptoms, h/a, dizziness, n/v, or fever/chills.    Abdominal Pain  This is a recurrent problem. The current episode started in the past 7 days. The onset quality is gradual. The problem occurs intermittently. The problem has been unchanged. The pain is located in the suprapubic region, LLQ and RLQ. The quality of the pain is cramping. The abdominal pain does not radiate. Pertinent negatives include no constipation, diarrhea, dysuria, fever, frequency, headaches, myalgias, nausea or vomiting. Nothing aggravates the pain. The pain is relieved by nothing. She has tried nothing for the symptoms.  Vaginal Bleeding  The patient's primary symptoms include pelvic pain, vaginal bleeding and vaginal discharge. The patient's pertinent negatives include no genital itching, genital lesions or genital odor. This is a recurrent problem. The current episode started in the past 7 days. The problem occurs intermittently. The problem has been unchanged. The pain is mild. The problem affects both sides. She is not pregnant. Associated symptoms include abdominal pain. Pertinent negatives include no constipation, diarrhea, dysuria, fever, frequency, headaches, nausea or vomiting.    RN Note: Patient states, "I was bit by a brown recluse spider about one month ago, and I have been cramping for two months." Patient reports cramping has been intermittent every day for two months.  Patient reports taking Tylenol for pain relief, but that it does not relieve the pain. Patient reports that her last three periods have been light brown and abnormal. LPM was 02/25/17. Patient denies using any birth control.     Electronically signed by Monico Blitz, RN at 02/27/2017 6:29 PM      Past Medical History: Past Medical History:  Diagnosis Date  . BV (bacterial vaginosis)   . Gonorrhea contact, treated   . H/O dilation and curettage   . Molar pregnancy 12-2013  . No pertinent past medical history   . Vaginal delivery 2012, 2013  . Yeast infection     Past obstetric history: OB History  Gravida Para Term Preterm AB Living  5 3 3  0 2 3  SAB TAB Ectopic Multiple Live Births  1 1 0 0 3    # Outcome Date GA Lbr Len/2nd Weight Sex Delivery Anes PTL Lv  5 Term 01/17/15 [redacted]w[redacted]d 08:29 / 00:26 6 lb 14.9 oz (3.145 kg) M Vag-Spont EPI  LIV  4 SAB 2015             Birth Comments: molar pregnancy  3 Term 02/24/12 [redacted]w[redacted]d / 01:03  M Vag-Spont EPI  LIV  2 Term 10/16/10 [redacted]w[redacted]d   F Vag-Spont   LIV  1 TAB 2010 [redacted]w[redacted]d             Past Surgical History: Past Surgical History:  Procedure Laterality Date  . DILATION AND CURETTAGE OF UTERUS    . DILATION AND EVACUATION N/A 12/20/2013   Procedure: DILATATION AND EVACUATION;  Surgeon: Adam Phenix, MD;  Location: [facility];  Service: Gynecology;  Laterality: N/A;    Family History: Family History  Problem Relation Age of Onset  . Heart disease Father     Social History: Social History  Substance Use Topics  . Smoking status: Never Smoker  . Smokeless tobacco: Never Used  . Alcohol use No    Allergies:  Allergies  Allergen Reactions  . Augmentin [Amoxicillin-Pot Clavulanate] Hives and Other (See Comments)  . Latex Rash    Vaginal irritation    Meds:  Prescriptions Prior to Admission  Medication Sig Dispense Refill Last Dose  . acetaminophen (TYLENOL) 325 MG tablet Take 650 mg by mouth every 6 (six) hours as needed for  headache. Reported on 09/30/2015   Not Taking  . aspirin-acetaminophen-caffeine (EXCEDRIN MIGRAINE) 250-250-65 MG tablet Take 1 tablet by mouth every 6 (six) hours as needed for headache. Reported on 09/30/2015   Not Taking  . azithromycin (ZITHROMAX) 500 MG tablet Take 1 tablet (500 mg total) by mouth daily. 4 tablet 0   . benzonatate (TESSALON) 100 MG capsule Take 2 capsules (200 mg total) by mouth 2 (two) times daily as needed for cough. 20 capsule 0   . cephALEXin (KEFLEX) 500 MG capsule Take 1 capsule (500 mg total) by mouth 4 (four) times daily. 20 capsule 0   . cyclobenzaprine (FLEXERIL) 10 MG tablet Take 1 tablet (10 mg total) by mouth 2 (two) times daily as needed for muscle spasms. 20 tablet 0   . HYDROcodone-acetaminophen (NORCO/VICODIN) 5-325 MG tablet Take 1 tablet by mouth every 4 (four) hours as needed. 8 tablet 0   . hydrocortisone 2.5 % cream Apply topically 2 (two) times daily as needed (itching). (Patient not taking: Reported on 09/30/2015) 30 g 0 Not Taking  . hydrOXYzine (ATARAX/VISTARIL) 25 MG tablet Take 1 tablet (25 mg total) by mouth every 6 (six) hours as needed for itching. (Patient not taking: Reported on 09/30/2015) 28 tablet 0 Not Taking  . ibuprofen (ADVIL,MOTRIN) 200 MG tablet Take 200 mg by mouth every 6 (six) hours as needed for headache. Reported on 09/30/2015   Not Taking  . ibuprofen (ADVIL,MOTRIN) 600 MG tablet Take 1 tablet (600 mg total) by mouth every 6 (six) hours as needed for mild pain. (Patient not taking: Reported on 05/01/2015) 30 tablet 5 Not Taking  . ketorolac (TORADOL) 10 MG tablet Take 1 tablet (10 mg total) by mouth every 8 (eight) hours as needed. (Patient not taking: Reported on 09/30/2015) 10 tablet 0 Not Taking  . metoCLOPramide (REGLAN) 10 MG tablet Take 1 tablet (10 mg total) by mouth every 6 (six) hours. (Patient not taking: Reported on 09/30/2015) 30 tablet 0 Not Taking  . naproxen (NAPROSYN) 500 MG tablet Take 1 tablet (500 mg total) by mouth 2  (two) times daily with a meal. 20 tablet 0   . oxyCODONE-acetaminophen (PERCOCET/ROXICET) 5-325 MG per tablet Take 1-2 tablets by mouth every 4 (four) hours as needed for moderate pain or severe pain (for pain scale greater than 7). (Patient not taking: Reported on 05/01/2015) 40 tablet 0 Not Taking  . oxymetazoline (AFRIN NASAL SPRAY) 0.05 % nasal spray Place 1 spray into both nostrils 2 (two) times daily. Do not take longer than 3 days to prevent rebound rhinorrhea 30 mL 0     I have reviewed patient's Past Medical Hx, Surgical Hx, Family Hx, Social Hx, medications and allergies.  ROS:  Review of Systems  Constitutional: Negative for fever.  Gastrointestinal: Positive for abdominal pain. Negative for constipation, diarrhea, nausea and vomiting.  Genitourinary: Positive for pelvic pain, vaginal bleeding and vaginal discharge. Negative for dysuria and frequency.  Musculoskeletal: Negative for myalgias.  Neurological: Negative for headaches.   Other systems negative     Physical Exam  Patient Vitals for the past 24 hrs:  BP Temp Temp src Pulse Resp SpO2 Height Weight  02/27/17 1830 129/76 98.2 F (36.8 C) Oral 83 16 100 % 5\' 2"  (1.575 m) 201 lb 1.9 oz (91.2 kg)   Constitutional: Well-developed, well-nourished female in no acute distress.  Cardiovascular: normal rate and rhythm, no ectopy audible, S1 & S2 heard, no murmur Respiratory: normal effort, no distress. Lungs CTAB with no wheezes or crackles GI: Abd soft, non-tender.  Nondistended.  No rebound, No guarding.  Bowel Sounds audible  MS: Extremities nontender, no edema, normal ROM Neurologic: Alert and oriented x 4.   Grossly nonfocal. GU: Neg CVAT. Skin:  Warm and Dry Psych:  Affect appropriate.  PELVIC EXAM: Cervix pink, visually closed, without lesion, scant brown discharge, vaginal walls and external genitalia normal Bimanual exam: Cervix firm, anterior, neg CMT, uterus slightly tender, nonenlarged, adnexa without  tenderness, enlargement, or mass    Labs: Results for orders placed or performed during the hospital encounter of 02/27/17 (from the past 72 hour(s))  Urinalysis, Routine w reflex microscopic     Status: Abnormal   Collection Time: 02/27/17  6:05 PM  Result Value Ref Range   Color, Urine YELLOW YELLOW   APPearance CLOUDY (A) CLEAR   Specific Gravity, Urine >1.030 (H) 1.005 - 1.030   pH 6.0 5.0 - 8.0   Glucose, UA NEGATIVE NEGATIVE mg/dL   Hgb urine dipstick LARGE (A) NEGATIVE   Bilirubin Urine NEGATIVE NEGATIVE   Ketones, ur NEGATIVE NEGATIVE mg/dL   Protein, ur 30 (A) NEGATIVE mg/dL   Nitrite NEGATIVE NEGATIVE   Leukocytes, UA SMALL (A) NEGATIVE  Urinalysis, Microscopic (reflex)     Status: Abnormal   Collection Time: 02/27/17  6:05 PM  Result Value Ref Range   RBC / HPF 0-5 0 - 5 RBC/hpf   WBC, UA 6-30 0 - 5 WBC/hpf   Bacteria, UA MANY (A) NONE SEEN   Squamous Epithelial / LPF 6-30 (A) NONE SEEN  Pregnancy, urine POC     Status: None   Collection Time: 02/27/17  6:29 PM  Result Value Ref Range   Preg Test, Ur NEGATIVE NEGATIVE    Comment:        THE SENSITIVITY OF THIS METHODOLOGY IS >24 mIU/mL     Imaging:  No results found.  MAU Course/MDM: I have ordered labs as follows:UPT and UA.  Blood in UA is likely due to menses Imaging ordered: none Results reviewed. Discussed PCOS and dysmenorrhea.  Needs to go to GYN office for long term plan, either metformin or OCPs.   Treatments in MAU included none.   Pt stable at time of discharge.  Assessment: Irregular menses - Plan: Discharge patient  PCOS (polycystic ovarian syndrome) - Plan: Discharge patient  Dysmenorrhea - Plan: Discharge patient   Plan: Discharge home Recommend follow up with GYN office for long term plan Rx sent for ibuprofen for dysmenorrhea   Encouraged to return here or to other Urgent Care/ED if she develops worsening of symptoms, increase in pain, fever, or other concerning symptoms.    Wynelle BourgeoisMarie Nusaybah Ivie CNM, MSN Certified Nurse-Midwife 02/27/2017 6:34 PM

## 2017-06-02 ENCOUNTER — Encounter (HOSPITAL_COMMUNITY): Payer: Self-pay | Admitting: Emergency Medicine

## 2017-06-02 ENCOUNTER — Ambulatory Visit (HOSPITAL_COMMUNITY)
Admission: EM | Admit: 2017-06-02 | Discharge: 2017-06-02 | Disposition: A | Discharge status: Home / Self Care | Payer: Managed Care, Other (non HMO) | Attending: Family Medicine | Admitting: Family Medicine

## 2017-06-02 ENCOUNTER — Other Ambulatory Visit: Payer: Self-pay

## 2017-06-02 DIAGNOSIS — N898 Other specified noninflammatory disorders of vagina: Secondary | ICD-10-CM

## 2017-06-02 DIAGNOSIS — Z3202 Encounter for pregnancy test, result negative: Secondary | ICD-10-CM

## 2017-06-02 DIAGNOSIS — Z113 Encounter for screening for infections with a predominantly sexual mode of transmission: Secondary | ICD-10-CM | POA: Insufficient documentation

## 2017-06-02 LAB — POCT PREGNANCY, URINE: Preg Test, Ur: NEGATIVE

## 2017-06-02 MED ORDER — METRONIDAZOLE 500 MG PO TABS: 500.0000 mg | ORAL_TABLET | Freq: Two times a day (BID) | ORAL | 0 refills | Status: AC

## 2017-06-02 MED ORDER — FLUCONAZOLE 150 MG PO TABS: 150.0000 mg | ORAL_TABLET | Freq: Once | ORAL | 0 refills | Status: AC

## 2017-06-02 NOTE — ED Triage Notes (Addendum)
Pt reports recurrent BV.  She has been using a medicated douche and Monistat.  Pt reports a discharge, odor, blood in the discharge and possible yeast.  She was on a treatment regimen from the health department.  She tried to get in with them but they were booked too far out for her to get in.  [facility], but not an ob/gyn for further evaluation.
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
## 2017-06-02 NOTE — Discharge Instructions (Signed)
We are treating you today for BV and yeast, with metronidazole and diflucan. Please refrain from sexual intercourse for 7 days while medicines eliminating infection. Avoid alcohol while on metronidazole.  We are testing you for Gonorrhea, Chlamydia, Trichomonas, Yeast and Bacterial Vaginosis. We will call you if anything is positive and let you know if you require any further treatment. Please inform partners of any positive results.   Please return if symptoms not improving with treatment, development of fever, nausea, vomiting, abdominal pain.   Follow up with health department and/or OBGYN .

## 2017-06-03 LAB — CERVICOVAGINAL ANCILLARY ONLY
Bacterial vaginitis: NEGATIVE
CANDIDA VAGINITIS: NEGATIVE
CHLAMYDIA, DNA PROBE: NEGATIVE
NEISSERIA GONORRHEA: NEGATIVE
TRICH (WINDOWPATH): POSITIVE — AB

## 2017-06-03 LAB — URINE CULTURE

## 2017-08-16 ENCOUNTER — Ambulatory Visit (HOSPITAL_COMMUNITY)
Admission: EM | Admit: 2017-08-16 | Discharge: 2017-08-16 | Disposition: A | Discharge status: Home / Self Care | Payer: Managed Care, Other (non HMO) | Attending: Family Medicine | Admitting: Family Medicine

## 2017-08-16 ENCOUNTER — Encounter (HOSPITAL_COMMUNITY): Payer: Self-pay | Admitting: Emergency Medicine

## 2017-08-16 DIAGNOSIS — N898 Other specified noninflammatory disorders of vagina: Secondary | ICD-10-CM | POA: Diagnosis present

## 2017-08-16 DIAGNOSIS — N760 Acute vaginitis: Secondary | ICD-10-CM | POA: Diagnosis not present

## 2017-08-16 DIAGNOSIS — J069 Acute upper respiratory infection, unspecified: Secondary | ICD-10-CM

## 2017-08-16 LAB — POCT URINALYSIS DIP (DEVICE)
Bilirubin Urine: NEGATIVE
Glucose, UA: NEGATIVE mg/dL
Ketones, ur: NEGATIVE mg/dL
NITRITE: NEGATIVE
PROTEIN: 30 mg/dL — AB
Urobilinogen, UA: 0.2 mg/dL (ref 0.0–1.0)
pH: 6 (ref 5.0–8.0)

## 2017-08-16 LAB — POCT PREGNANCY, URINE: Preg Test, Ur: NEGATIVE

## 2017-08-16 MED ORDER — METRONIDAZOLE 500 MG PO TABS: 500.0000 mg | ORAL_TABLET | Freq: Two times a day (BID) | ORAL | 0 refills | Status: AC

## 2017-08-16 MED ORDER — FLUTICASONE PROPIONATE 50 MCG/ACT NA SUSP: 1.0000 | Freq: Every day | NASAL | 2 refills | Status: DC

## 2017-08-16 MED ORDER — CETIRIZINE-PSEUDOEPHEDRINE ER 5-120 MG PO TB12: 1.0000 | ORAL_TABLET | Freq: Every day | ORAL | 0 refills | Status: DC

## 2017-08-16 MED ORDER — BENZONATATE 100 MG PO CAPS: 200.0000 mg | ORAL_CAPSULE | Freq: Three times a day (TID) | ORAL | 0 refills | Status: DC

## 2017-08-16 NOTE — ED Triage Notes (Signed)
Pt sts URI sx and vaginal discharge
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
## 2017-08-16 NOTE — Discharge Instructions (Signed)
Push fluids to ensure adequate hydration and keep secretions thin.  Tylenol and/or ibuprofen as needed for pain or fevers.  Daily zyrtec d to help with congestion as well as daily flonase. Will start BV treatment, your test is pending.  Will notify your of any positive findings and if any changes to treatment are needed.   If symptoms worsen or do not improve in the next week to return to be seen or to follow up with your PCP.

## 2017-08-17 LAB — URINE CYTOLOGY ANCILLARY ONLY
CHLAMYDIA, DNA PROBE: NEGATIVE
Neisseria Gonorrhea: NEGATIVE
Trichomonas: NEGATIVE

## 2017-08-18 ENCOUNTER — Telehealth (HOSPITAL_COMMUNITY): Payer: Self-pay

## 2017-08-18 LAB — URINE CYTOLOGY ANCILLARY ONLY: Candida vaginitis: NEGATIVE

## 2017-08-18 NOTE — Telephone Encounter (Signed)
Pt contacted regarding results from recent visit being within normal range. Answered all questions. 

## 2017-08-19 ENCOUNTER — Telehealth (HOSPITAL_COMMUNITY): Payer: Self-pay

## 2017-08-19 NOTE — Telephone Encounter (Signed)
Attempted to contact pt regarding test for gardnerella (bacterial vaginosis) being positive.  Prescription for metronidazole was given at the urgent care visit. No answer and no voicemail set up

## 2017-09-08 ENCOUNTER — Inpatient Hospital Stay (HOSPITAL_COMMUNITY)
Admission: AD | Admit: 2017-09-08 | Discharge: 2017-09-09 | Discharge status: Against Medical Advice | Payer: Managed Care, Other (non HMO) | Source: Ambulatory Visit | Attending: Obstetrics & Gynecology | Admitting: Obstetrics & Gynecology

## 2017-09-08 DIAGNOSIS — N939 Abnormal uterine and vaginal bleeding, unspecified: Secondary | ICD-10-CM | POA: Diagnosis present

## 2017-09-08 DIAGNOSIS — Z5321 Procedure and treatment not carried out due to patient leaving prior to being seen by health care provider: Secondary | ICD-10-CM | POA: Diagnosis not present

## 2017-09-08 LAB — URINALYSIS, ROUTINE W REFLEX MICROSCOPIC
BILIRUBIN URINE: NEGATIVE
Bacteria, UA: NONE SEEN
GLUCOSE, UA: NEGATIVE mg/dL
KETONES UR: NEGATIVE mg/dL
NITRITE: NEGATIVE
PH: 5 (ref 5.0–8.0)
Protein, ur: 30 mg/dL — AB
SPECIFIC GRAVITY, URINE: 1.028 (ref 1.005–1.030)
WBC, UA: >50 WBC/hpf — ABNORMAL HIGH (ref 0–5)

## 2017-09-08 LAB — POCT PREGNANCY, URINE: Preg Test, Ur: NEGATIVE

## 2017-09-08 NOTE — MAU Note (Addendum)
Pt here with complaints of vaginal bleeding during intercourse. States it happens it every time. States it gets on the bed and covers the tissue when wiping but then it stops. Not currently bleeding. States she does currently have a yeast infection. Pt denies pain. Does not currently have an OBGYN.

## 2017-09-09 NOTE — MAU Note (Signed)
Pt not in lobby.  

## 2017-09-10 ENCOUNTER — Encounter (HOSPITAL_COMMUNITY): Payer: Self-pay

## 2017-09-10 ENCOUNTER — Inpatient Hospital Stay (HOSPITAL_COMMUNITY)
Admission: AD | Admit: 2017-09-10 | Discharge: 2017-09-10 | Disposition: A | Discharge status: Home / Self Care | Payer: Managed Care, Other (non HMO) | Source: Ambulatory Visit | Attending: Obstetrics & Gynecology | Admitting: Obstetrics & Gynecology

## 2017-09-10 DIAGNOSIS — Z3202 Encounter for pregnancy test, result negative: Secondary | ICD-10-CM | POA: Diagnosis not present

## 2017-09-10 DIAGNOSIS — N760 Acute vaginitis: Secondary | ICD-10-CM | POA: Insufficient documentation

## 2017-09-10 DIAGNOSIS — Z7982 Long term (current) use of aspirin: Secondary | ICD-10-CM | POA: Diagnosis not present

## 2017-09-10 DIAGNOSIS — Z880 Allergy status to penicillin: Secondary | ICD-10-CM | POA: Insufficient documentation

## 2017-09-10 DIAGNOSIS — N930 Postcoital and contact bleeding: Secondary | ICD-10-CM | POA: Diagnosis present

## 2017-09-10 DIAGNOSIS — N730 Acute parametritis and pelvic cellulitis: Secondary | ICD-10-CM

## 2017-09-10 HISTORY — DX: Personal history of other infectious and parasitic diseases: Z86.19

## 2017-09-10 HISTORY — DX: Polycystic ovarian syndrome: E28.2

## 2017-09-10 LAB — POCT PREGNANCY, URINE: PREG TEST UR: NEGATIVE

## 2017-09-10 LAB — WET PREP, GENITAL
Clue Cells Wet Prep HPF POC: NONE SEEN
Trich, Wet Prep: NONE SEEN
YEAST WET PREP: NONE SEEN

## 2017-09-10 LAB — URINALYSIS, ROUTINE W REFLEX MICROSCOPIC
BACTERIA UA: NONE SEEN
BILIRUBIN URINE: NEGATIVE
Glucose, UA: NEGATIVE mg/dL
KETONES UR: NEGATIVE mg/dL
Leukocytes, UA: NEGATIVE
Nitrite: NEGATIVE
PH: 5 (ref 5.0–8.0)
Protein, ur: NEGATIVE mg/dL
SPECIFIC GRAVITY, URINE: 1.031 — AB (ref 1.005–1.030)

## 2017-09-10 MED ORDER — CEFTRIAXONE SODIUM 250 MG IJ SOLR
250.0000 mg | Freq: Once | INTRAMUSCULAR | Status: AC
  Administered 2017-09-10: 250 mg via INTRAMUSCULAR
  Filled 2017-09-10: qty 250

## 2017-09-10 MED ORDER — FLUCONAZOLE 150 MG PO TABS: 150.0000 mg | ORAL_TABLET | Freq: Every day | ORAL | 1 refills | Status: DC

## 2017-09-10 MED ORDER — DOXYCYCLINE HYCLATE 100 MG PO CAPS: 100.0000 mg | ORAL_CAPSULE | Freq: Two times a day (BID) | ORAL | 0 refills | Status: DC

## 2017-09-10 NOTE — Discharge Instructions (Signed)
Pelvic Inflammatory Disease Pelvic inflammatory disease (PID) refers to an infection in some or all of the female organs. The infection can be in the uterus, ovaries, fallopian tubes, or the surrounding tissues in the pelvis. PID can cause abdominal or pelvic pain that comes on suddenly (acute pelvic pain). PID is a serious infection because it can lead to lasting (chronic) pelvic pain or the inability to have children (infertility). What are the causes? This condition is most often caused by an infection that is spread during sexual contact. However, the infection can also be caused by the normal bacteria that are found in the vaginal tissues if these bacteria travel upward into the reproductive organs. PID can also occur following:  The birth of a baby.  A miscarriage.  An abortion.  Major pelvic surgery.  The use of an intrauterine device (IUD).  A sexual assault.  What increases the risk? This condition is more likely to develop in women who:  Are younger than 27 years of age.  Are sexually active at [facility] age.  Use nonbarrier contraception.  Have multiple sexual partners.  Have sex with someone who has symptoms of an STD (sexually transmitted disease).  Use oral contraception.  At times, certain behaviors can also increase the possibility of getting PID, such as:  Using a vaginal douche.  Having an IUD in place.  What are the signs or symptoms? Symptoms of this condition include:  Abdominal or pelvic pain.  Fever.  Chills.  Abnormal vaginal discharge.  Abnormal uterine bleeding.  Unusual pain shortly after the end of a menstrual period.  Painful urination.  Pain with sexual intercourse.  Nausea and vomiting.  How is this diagnosed? To diagnose this condition, your health care provider will do a physical exam and take your medical history. A pelvic exam typically reveals great tenderness in the uterus and the surrounding pelvic tissues. You may also  have tests, such as:  Lab tests, including a pregnancy test, blood tests, and urine test.  Culture tests of the vagina and cervix to check for an STD.  Ultrasound.  A laparoscopic procedure to look inside the pelvis.  Examining vaginal secretions under a microscope.  How is this treated? Treatment for this condition may involve one or more approaches.  Antibiotic medicines may be prescribed to be taken by mouth.  Sexual partners may need to be treated if the infection is caused by an STD.  For more severe cases, hospitalization may be needed to give antibiotics directly into a vein through an IV tube.  Surgery may be needed if other treatments do not help, but this is rare.  It may take weeks until you are completely well. If you are diagnosed with PID, you should also be checked for human immunodeficiency virus (HIV). Your health care provider may test you for infection again 3 months after treatment. You should not have unprotected sex. Follow these instructions at home:  Take over-the-counter and prescription medicines only as told by your health care provider.  If you were prescribed an antibiotic medicine, take it as told by your health care provider. Do not stop taking the antibiotic even if you start to feel better.  Do not have sexual intercourse until treatment is completed or as told by your health care provider. If PID is confirmed, your recent sexual partners will need treatment, especially if you had unprotected sex.  Keep all follow-up visits as told by your health care provider. This is important. Contact a health care  provider if:  You have increased or abnormal vaginal discharge.  Your pain does not improve.  You vomit.  You have a fever.  You cannot tolerate your medicines.  Your partner has an STD.  You have pain when you urinate. Get help right away if:  You have increased abdominal or pelvic pain.  You have chills.  Your symptoms are not  better in 72 hours even with treatment. This information is not intended to replace advice given to you by your health care provider. Make sure you discuss any questions you have with your health care provider. Document Released: 04/27/2005 Document Revised: 10/03/2015 Document Reviewed: 06/04/2014 Elsevier Interactive Patient Education  2018 Elsevier Inc.       [place] [facility] Providers    [facility] [employer] [facility]       Phone: [phone]  [facility] [employer] [facility] [place] Phone: [phone]  [facility] [employer] at [place]  Phone: [phone]  [facility] at [employer]  Phone: [phone]  [facility] at [place]  Phone: [phone]  [facility]       Phone: [phone]  [facility]    Phone: [phone]   [facility])    Phone: [phone]  [facility]    Phone: [phone]  [facility]                                     Phone: [phone]  [facility]    Phone: [phone]  [facility]    Phone: [phone]  [facility]       Phone: [phone]   [facility]  Phone: [phone]  [facility]    Phone: [phone]  [facility]   Phone: [phone]  [facility]      Phone: [phone]  [facility]    Phone: [phone]

## 2017-09-10 NOTE — MAU Note (Signed)
Pt presents to MAU with complaints of vaginal bleeding with intercourse for months. Reports a yeast infection and has treated herself with monistat for the last 3 days. Denies any vaginal bleeding

## 2017-09-10 NOTE — MAU Provider Note (Signed)
History     CSN: [id number]  Arrival date and time: 09/10/17 1057   First Provider Initiated Contact with Patient 09/10/17 1224      Chief Complaint  Patient presents with  . Vaginitis  . Vaginal Bleeding   HPI  Anita Allen is a 27 y.o. non pregnant female who presents vaginal irritation & post coital bleeding. Has been with current partner x 2 months and uses condoms irregularly. Had negative STD testing last month. Reports postcoital bleeding with intercourse for the last month. Also reports vaginal irritation, swelling, and discharge for the last week. Has tried using monistat & metrogel without relief. Denies fever/chills.   Past Medical History:  Diagnosis Date  . BV (bacterial vaginosis)   . Gonorrhea contact, treated   . H/O dilation and curettage   . Hx of trichomoniasis   . Molar pregnancy 12-2013  . PCOS (polycystic ovarian syndrome)   . Vaginal delivery 2012, 2013  . Yeast infection     Past Surgical History:  Procedure Laterality Date  . DILATION AND CURETTAGE OF UTERUS    . DILATION AND EVACUATION N/A 12/20/2013   Procedure: DILATATION AND EVACUATION;  Surgeon: Adam Phenix, MD;  Location: [facility];  Service: Gynecology;  Laterality: N/A;    Family History  Problem Relation Age of Onset  . Heart disease Father     Social History   Tobacco Use  . Smoking status: Never Smoker  . Smokeless tobacco: Never Used  Substance Use Topics  . Alcohol use: No  . Drug use: No    Allergies:  Allergies  Allergen Reactions  . Augmentin [Amoxicillin-Pot Clavulanate] Hives and Other (See Comments)  . Latex Rash    Vaginal irritation    Medications Prior to Admission  Medication Sig Dispense Refill Last Dose  . acetaminophen (TYLENOL) 325 MG tablet Take 650 mg by mouth every 6 (six) hours as needed for headache. Reported on 09/30/2015   Not Taking  . aspirin-acetaminophen-caffeine (EXCEDRIN MIGRAINE) 250-250-65 MG tablet Take 1 tablet by mouth every 6 (six)  hours as needed for headache. Reported on 09/30/2015   Not Taking  . benzonatate (TESSALON) 100 MG capsule Take 2 capsules (200 mg total) by mouth every 8 (eight) hours. 21 capsule 0   . cetirizine-pseudoephedrine (ZYRTEC-D) 5-120 MG tablet Take 1 tablet by mouth daily. 30 tablet 0   . fluticasone (FLONASE) 50 MCG/ACT nasal spray Place 1 spray into both nostrils daily. 16 g 2   . naproxen (NAPROSYN) 500 MG tablet Take 1 tablet (500 mg total) by mouth 2 (two) times daily with a meal. 20 tablet 0     Review of Systems  Constitutional: Negative.   Gastrointestinal: Negative.   Genitourinary: Positive for vaginal bleeding, vaginal discharge and vaginal pain. Negative for dyspareunia, dysuria and genital sores.   Physical Exam   Blood pressure 138/78, pulse 91, temperature 97.8 F (36.6 C), resp. rate 18, weight 195 lb (88.5 kg), last menstrual period 08/27/2017.  Physical Exam  Nursing note and vitals reviewed. Constitutional: She is oriented to person, place, and time. She appears well-developed and well-nourished. No distress.  HENT:  Head: Normocephalic and atraumatic.  Eyes: Conjunctivae are normal. Right eye exhibits no discharge. Left eye exhibits no discharge. No scleral icterus.  Neck: Normal range of motion.  Respiratory: Effort normal. No respiratory distress.  GI: Soft. There is no tenderness.  Genitourinary: There is no lesion on the right labia. There is no lesion on the left labia. Cervix  exhibits motion tenderness and friability. There is erythema in the vagina. No bleeding in the vagina. Vaginal discharge found.  Genitourinary Comments: Bilateral labia minora swelling & erythema.   Neurological: She is alert and oriented to person, place, and time.  Skin: Skin is warm and dry. She is not diaphoretic.  Psychiatric: She has a normal mood and affect. Her behavior is normal. Judgment and thought content normal.    MAU Course  Procedures Results for orders placed or performed  during the hospital encounter of 09/10/17 (from the past 24 hour(s))  Urinalysis, Routine w reflex microscopic     Status: Abnormal   Collection Time: 09/10/17 11:30 AM  Result Value Ref Range   Color, Urine YELLOW YELLOW   APPearance HAZY (A) CLEAR   Specific Gravity, Urine 1.031 (H) 1.005 - 1.030   pH 5.0 5.0 - 8.0   Glucose, UA NEGATIVE NEGATIVE mg/dL   Hgb urine dipstick MODERATE (A) NEGATIVE   Bilirubin Urine NEGATIVE NEGATIVE   Ketones, ur NEGATIVE NEGATIVE mg/dL   Protein, ur NEGATIVE NEGATIVE mg/dL   Nitrite NEGATIVE NEGATIVE   Leukocytes, UA NEGATIVE NEGATIVE   RBC / HPF 11-20 0 - 5 RBC/hpf   WBC, UA 0-5 0 - 5 WBC/hpf   Bacteria, UA NONE SEEN NONE SEEN   Squamous Epithelial / LPF 0-5 0 - 5   Mucus PRESENT   Pregnancy, urine POC     Status: None   Collection Time: 09/10/17 11:43 AM  Result Value Ref Range   Preg Test, Ur NEGATIVE NEGATIVE  Wet prep, genital     Status: Abnormal   Collection Time: 09/10/17 12:52 PM  Result Value Ref Range   Yeast Wet Prep HPF POC NONE SEEN NONE SEEN   Trich, Wet Prep NONE SEEN NONE SEEN   Clue Cells Wet Prep HPF POC NONE SEEN NONE SEEN   WBC, Wet Prep HPF POC MANY (A) NONE SEEN   Sperm PRESENT     MDM UPT negative On exam labial swelling, discharge, cervical friability & CMT. Wet prep with many WBC. Will tx for PID.  Rocephin 250 mg IM given in MAU  Assessment and Plan  A: 1. Acute PID (pelvic inflammatory disease)   2. Acute vaginitis    P: Discharge home Rx doxycycline x 2 weeks GC/CT pending Rx diflucan No intercourse until tx completed  Judeth Horn 09/10/2017, 12:24 PM

## 2017-09-13 LAB — GC/CHLAMYDIA PROBE AMP (~~LOC~~) NOT AT ARMC
CHLAMYDIA, DNA PROBE: NEGATIVE
NEISSERIA GONORRHEA: NEGATIVE

## 2017-10-28 ENCOUNTER — Encounter (HOSPITAL_COMMUNITY): Payer: Self-pay | Admitting: *Deleted

## 2017-10-28 ENCOUNTER — Other Ambulatory Visit: Payer: Self-pay

## 2017-10-28 ENCOUNTER — Emergency Department (HOSPITAL_COMMUNITY)
Admission: EM | Admit: 2017-10-28 | Discharge: 2017-10-28 | Disposition: A | Discharge status: Home / Self Care | Payer: Managed Care, Other (non HMO) | Attending: Emergency Medicine | Admitting: Emergency Medicine

## 2017-10-28 DIAGNOSIS — Z91040 Latex allergy status: Secondary | ICD-10-CM | POA: Diagnosis not present

## 2017-10-28 DIAGNOSIS — L732 Hidradenitis suppurativa: Secondary | ICD-10-CM | POA: Insufficient documentation

## 2017-10-28 MED ORDER — TRAMADOL HCL 50 MG PO TABS
50.0000 mg | ORAL_TABLET | Freq: Once | ORAL | Status: AC
  Administered 2017-10-28: 50 mg via ORAL
  Filled 2017-10-28: qty 1

## 2017-10-28 MED ORDER — TRAMADOL HCL 50 MG PO TABS: 50.0000 mg | ORAL_TABLET | Freq: Four times a day (QID) | ORAL | 0 refills | Status: DC | PRN

## 2017-10-28 MED ORDER — CLINDAMYCIN HCL 150 MG PO CAPS
300.0000 mg | ORAL_CAPSULE | Freq: Once | ORAL | Status: AC
  Administered 2017-10-28: 300 mg via ORAL
  Filled 2017-10-28: qty 2

## 2017-10-28 MED ORDER — CLINDAMYCIN HCL 150 MG PO CAPS: 300.0000 mg | ORAL_CAPSULE | Freq: Three times a day (TID) | ORAL | 0 refills | Status: DC

## 2017-10-28 NOTE — ED Triage Notes (Signed)
Pt has been having bumps under bilateral arms for the past three weeks with some purulent drainage. Pt also reports a tiny bump to knee and back as well

## 2017-10-28 NOTE — Discharge Instructions (Signed)
Take the prescribed medication as directed.  Recommend warm compresses to both axilla a few times a day when possible.  Try not to shave until bumps start to go away as this can make it worse. Follow-up with your primary care doctor. Return to the ED for new or worsening symptoms.
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
## 2017-10-28 NOTE — ED Notes (Signed)
"  Patient verbalizes understanding of discharge instructions. Opportunity for questioning and answers were provided.  pt discharged from ED ."  

## 2017-12-24 ENCOUNTER — Emergency Department (HOSPITAL_COMMUNITY): Payer: Self-pay

## 2017-12-24 ENCOUNTER — Emergency Department (HOSPITAL_COMMUNITY)
Admission: EM | Admit: 2017-12-24 | Discharge: 2017-12-24 | Disposition: A | Discharge status: Home / Self Care | Payer: Self-pay | Attending: Emergency Medicine | Admitting: Emergency Medicine

## 2017-12-24 ENCOUNTER — Encounter (HOSPITAL_COMMUNITY): Payer: Self-pay | Admitting: Emergency Medicine

## 2017-12-24 ENCOUNTER — Other Ambulatory Visit: Payer: Self-pay

## 2017-12-24 DIAGNOSIS — S5011XA Contusion of right forearm, initial encounter: Secondary | ICD-10-CM | POA: Insufficient documentation

## 2017-12-24 DIAGNOSIS — Z79899 Other long term (current) drug therapy: Secondary | ICD-10-CM | POA: Insufficient documentation

## 2017-12-24 DIAGNOSIS — S7011XA Contusion of right thigh, initial encounter: Secondary | ICD-10-CM | POA: Insufficient documentation

## 2017-12-24 DIAGNOSIS — Z91040 Latex allergy status: Secondary | ICD-10-CM | POA: Insufficient documentation

## 2017-12-24 DIAGNOSIS — Y929 Unspecified place or not applicable: Secondary | ICD-10-CM | POA: Insufficient documentation

## 2017-12-24 DIAGNOSIS — Y939 Activity, unspecified: Secondary | ICD-10-CM | POA: Insufficient documentation

## 2017-12-24 DIAGNOSIS — Y999 Unspecified external cause status: Secondary | ICD-10-CM | POA: Insufficient documentation

## 2017-12-24 LAB — POC URINE PREG, ED: Preg Test, Ur: NEGATIVE

## 2017-12-24 MED ORDER — IBUPROFEN 600 MG PO TABS: 600.0000 mg | ORAL_TABLET | Freq: Four times a day (QID) | ORAL | 0 refills | Status: DC | PRN

## 2017-12-24 MED ORDER — ONDANSETRON 4 MG PO TBDP
8.0000 mg | ORAL_TABLET | Freq: Once | ORAL | Status: AC
  Administered 2017-12-24: 8 mg via ORAL
  Filled 2017-12-24: qty 2

## 2017-12-24 MED ORDER — NAPROXEN 250 MG PO TABS
500.0000 mg | ORAL_TABLET | Freq: Once | ORAL | Status: AC
Start: 2017-12-24 — End: 2017-12-24
  Administered 2017-12-24: 500 mg via ORAL
  Filled 2017-12-24: qty 2

## 2017-12-24 MED ORDER — OXYCODONE-ACETAMINOPHEN 5-325 MG PO TABS
1.0000 | ORAL_TABLET | ORAL | Status: DC | PRN
  Administered 2017-12-24: 1 via ORAL
  Filled 2017-12-24: qty 1
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
## 2017-12-24 NOTE — ED Notes (Signed)
Pt. Given arm sling per RN  For pain.
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
## 2018-01-02 ENCOUNTER — Emergency Department (HOSPITAL_COMMUNITY): Payer: Self-pay

## 2018-01-02 ENCOUNTER — Emergency Department (HOSPITAL_COMMUNITY)
Admission: EM | Admit: 2018-01-02 | Discharge: 2018-01-02 | Disposition: A | Discharge status: Home / Self Care | Payer: Self-pay | Attending: Emergency Medicine | Admitting: Emergency Medicine

## 2018-01-02 ENCOUNTER — Encounter (HOSPITAL_COMMUNITY): Payer: Self-pay

## 2018-01-02 DIAGNOSIS — T148XXA Other injury of unspecified body region, initial encounter: Secondary | ICD-10-CM

## 2018-01-02 DIAGNOSIS — M79631 Pain in right forearm: Secondary | ICD-10-CM | POA: Insufficient documentation

## 2018-01-02 DIAGNOSIS — Z79899 Other long term (current) drug therapy: Secondary | ICD-10-CM | POA: Insufficient documentation

## 2018-01-02 DIAGNOSIS — S5011XD Contusion of right forearm, subsequent encounter: Secondary | ICD-10-CM

## 2018-01-02 DIAGNOSIS — Z91040 Latex allergy status: Secondary | ICD-10-CM | POA: Insufficient documentation

## 2018-01-02 MED ORDER — TRAMADOL HCL 50 MG PO TABS: 50.0000 mg | ORAL_TABLET | Freq: Four times a day (QID) | ORAL | 0 refills | Status: DC | PRN

## 2018-01-02 MED ORDER — TRAMADOL HCL 50 MG PO TABS
50.0000 mg | ORAL_TABLET | Freq: Once | ORAL | Status: AC
  Administered 2018-01-02: 50 mg via ORAL
  Filled 2018-01-02: qty 1
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
## 2018-01-02 NOTE — Discharge Instructions (Addendum)
It was our pleasure to provide your ER care today - we hope that you feel better.  Elevate arm as much as possible, above level of heart, to help with swelling.   You may try ice/coldpack and/or heat therapy for symptom relief.   Avoid repetitive use of hand/wrist or other activities that aggravate your pain.   Take motrin or aleve as need for pain. You may also take ultram as need for pain - no driving when taking.  Return to ER if worse, new symptoms, abrupt worsening of pain or intractable pain, numbness/weakness, other concern.

## 2018-01-02 NOTE — ED Notes (Signed)
Graham crackers and Sprite given per pt request

## 2018-02-10 ENCOUNTER — Ambulatory Visit (HOSPITAL_COMMUNITY)
Admission: EM | Admit: 2018-02-10 | Discharge: 2018-02-10 | Disposition: A | Discharge status: Home / Self Care | Payer: Self-pay | Attending: Family Medicine | Admitting: Family Medicine

## 2018-02-10 ENCOUNTER — Other Ambulatory Visit: Payer: Self-pay

## 2018-02-10 DIAGNOSIS — N760 Acute vaginitis: Secondary | ICD-10-CM | POA: Insufficient documentation

## 2018-02-10 DIAGNOSIS — Z202 Contact with and (suspected) exposure to infections with a predominantly sexual mode of transmission: Secondary | ICD-10-CM | POA: Insufficient documentation

## 2018-02-10 DIAGNOSIS — Z79899 Other long term (current) drug therapy: Secondary | ICD-10-CM | POA: Insufficient documentation

## 2018-02-10 DIAGNOSIS — Z113 Encounter for screening for infections with a predominantly sexual mode of transmission: Secondary | ICD-10-CM

## 2018-02-10 DIAGNOSIS — B9689 Other specified bacterial agents as the cause of diseases classified elsewhere: Secondary | ICD-10-CM | POA: Insufficient documentation

## 2018-02-10 DIAGNOSIS — N930 Postcoital and contact bleeding: Secondary | ICD-10-CM | POA: Insufficient documentation

## 2018-02-10 DIAGNOSIS — E282 Polycystic ovarian syndrome: Secondary | ICD-10-CM | POA: Insufficient documentation

## 2018-02-10 DIAGNOSIS — Z880 Allergy status to penicillin: Secondary | ICD-10-CM | POA: Insufficient documentation

## 2018-02-10 LAB — POCT URINALYSIS DIP (DEVICE)
Bilirubin Urine: NEGATIVE
Glucose, UA: NEGATIVE mg/dL
Ketones, ur: NEGATIVE mg/dL
NITRITE: NEGATIVE
PH: 6 (ref 5.0–8.0)
PROTEIN: 30 mg/dL — AB
Specific Gravity, Urine: >=1.03 (ref 1.005–1.030)
UROBILINOGEN UA: 0.2 mg/dL (ref 0.0–1.0)

## 2018-02-10 MED ORDER — AZITHROMYCIN 250 MG PO TABS
1000.0000 mg | ORAL_TABLET | Freq: Once | ORAL | Status: AC
  Administered 2018-02-10: 1000 mg via ORAL

## 2018-02-10 MED ORDER — NITROFURANTOIN MONOHYD MACRO 100 MG PO CAPS: 100.0000 mg | ORAL_CAPSULE | Freq: Two times a day (BID) | ORAL | 0 refills | Status: DC

## 2018-02-10 MED ORDER — AZITHROMYCIN 250 MG PO TABS
ORAL_TABLET | ORAL | Status: AC
  Filled 2018-02-10: qty 4

## 2018-02-10 NOTE — ED Triage Notes (Signed)
Pt states she was told that she need to get test for STDs.

## 2018-02-10 NOTE — Discharge Instructions (Addendum)
We should have your final results back tomorrow night.  We will call you with any positive results.  In the meantime we are treating you for chlamydia, and that treatment should also stop BV.  We are also starting you on an antibiotic for urinary infection because of tonight's positive results.
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
## 2018-02-12 LAB — URINE CULTURE

## 2018-02-14 LAB — URINE CYTOLOGY ANCILLARY ONLY
Candida vaginitis: NEGATIVE
Chlamydia: POSITIVE — AB
Neisseria Gonorrhea: NEGATIVE
Trichomonas: NEGATIVE

## 2018-02-17 ENCOUNTER — Telehealth (HOSPITAL_COMMUNITY): Payer: Self-pay

## 2018-02-17 MED ORDER — AZITHROMYCIN 250 MG PO TABS: 1000.0000 mg | ORAL_TABLET | Freq: Once | ORAL | 0 refills | Status: AC

## 2018-02-17 MED ORDER — FLUCONAZOLE 150 MG PO TABS: 150.0000 mg | ORAL_TABLET | Freq: Every day | ORAL | 0 refills | Status: AC

## 2018-02-17 MED ORDER — METRONIDAZOLE 500 MG PO TABS: 500.0000 mg | ORAL_TABLET | Freq: Two times a day (BID) | ORAL | 0 refills | Status: DC

## 2018-02-17 NOTE — Telephone Encounter (Signed)
Bacterial vaginosis is positive. This was not treated at the urgent care visit.  Patient complains of persistent symptoms.  Flagyl 500 mg BID x 7 days #14 no refills sent to patients pharmacy of choice per Dr. Dayton Scrape.  Pt called and made aware of results and new prescription. Answered all questions and pt verbalized understanding.   Chlamydia is positive.  Rx po zithromax 1g #1 dose no refills was sent to the pharmacy of record.  Pt contacted and made aware, educated to please refrain from sexual intercourse for 7 days to give the medicine time to work, sexual partners need to be notified and tested/treated.  Condoms may reduce risk of reinfection.  Recheck or followup with PCP for further evaluation if symptoms are not improving.   [facility] notified  Pt requesting Diflucan for yeast infection due to history with same with taking Flagyl

## 2018-04-27 ENCOUNTER — Telehealth (HOSPITAL_COMMUNITY): Payer: Self-pay | Admitting: Emergency Medicine

## 2018-04-27 ENCOUNTER — Ambulatory Visit (HOSPITAL_COMMUNITY)
Admission: EM | Admit: 2018-04-27 | Discharge: 2018-04-27 | Disposition: A | Discharge status: Home / Self Care | Payer: Self-pay | Attending: Internal Medicine | Admitting: Internal Medicine

## 2018-04-27 ENCOUNTER — Other Ambulatory Visit: Payer: Self-pay

## 2018-04-27 ENCOUNTER — Encounter (HOSPITAL_COMMUNITY): Payer: Self-pay | Admitting: Emergency Medicine

## 2018-04-27 DIAGNOSIS — L989 Disorder of the skin and subcutaneous tissue, unspecified: Secondary | ICD-10-CM | POA: Insufficient documentation

## 2018-04-27 DIAGNOSIS — Z880 Allergy status to penicillin: Secondary | ICD-10-CM | POA: Insufficient documentation

## 2018-04-27 DIAGNOSIS — E282 Polycystic ovarian syndrome: Secondary | ICD-10-CM | POA: Insufficient documentation

## 2018-04-27 DIAGNOSIS — Z113 Encounter for screening for infections with a predominantly sexual mode of transmission: Secondary | ICD-10-CM

## 2018-04-27 DIAGNOSIS — Z91040 Latex allergy status: Secondary | ICD-10-CM | POA: Insufficient documentation

## 2018-04-27 DIAGNOSIS — Z881 Allergy status to other antibiotic agents status: Secondary | ICD-10-CM | POA: Insufficient documentation

## 2018-04-27 DIAGNOSIS — N760 Acute vaginitis: Secondary | ICD-10-CM | POA: Insufficient documentation

## 2018-04-27 DIAGNOSIS — B9689 Other specified bacterial agents as the cause of diseases classified elsewhere: Secondary | ICD-10-CM | POA: Insufficient documentation

## 2018-04-27 LAB — POCT URINALYSIS DIP (DEVICE)
Bilirubin Urine: NEGATIVE
Glucose, UA: NEGATIVE mg/dL
Ketones, ur: NEGATIVE mg/dL
NITRITE: NEGATIVE
PH: 6 (ref 5.0–8.0)
PROTEIN: 30 mg/dL — AB
Urobilinogen, UA: 0.2 mg/dL (ref 0.0–1.0)

## 2018-04-27 MED ORDER — VALACYCLOVIR HCL 1 G PO TABS: 1000.0000 mg | ORAL_TABLET | Freq: Three times a day (TID) | ORAL | 0 refills | Status: DC

## 2018-04-27 MED ORDER — NITROFURANTOIN MONOHYD MACRO 100 MG PO CAPS: 100.0000 mg | ORAL_CAPSULE | Freq: Two times a day (BID) | ORAL | 0 refills | Status: DC

## 2018-04-27 MED ORDER — AZITHROMYCIN 250 MG PO TABS
ORAL_TABLET | ORAL | Status: AC
  Filled 2018-04-27: qty 4

## 2018-04-27 MED ORDER — METRONIDAZOLE 500 MG PO TABS: 500.0000 mg | ORAL_TABLET | Freq: Two times a day (BID) | ORAL | 0 refills | Status: DC

## 2018-04-27 MED ORDER — CEFTRIAXONE SODIUM 250 MG IJ SOLR
250.0000 mg | Freq: Once | INTRAMUSCULAR | Status: AC
  Administered 2018-04-27: 250 mg via INTRAMUSCULAR

## 2018-04-27 MED ORDER — AZITHROMYCIN 250 MG PO TABS
1000.0000 mg | ORAL_TABLET | Freq: Once | ORAL | Status: AC
  Administered 2018-04-27: 1000 mg via ORAL

## 2018-04-27 MED ORDER — CEFTRIAXONE SODIUM 250 MG IJ SOLR
INTRAMUSCULAR | Status: AC
  Filled 2018-04-27: qty 250

## 2018-04-27 MED ORDER — FLUCONAZOLE 150 MG PO TABS: 150.0000 mg | ORAL_TABLET | Freq: Every day | ORAL | 0 refills | Status: DC

## 2018-04-27 NOTE — ED Triage Notes (Signed)
PT is here for suspected BV. PT is also concerned about a sore near her rectum. PT shaved this area about a week ago.
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
## 2018-04-27 NOTE — Discharge Instructions (Addendum)
Your urine will be sent for a culture, shows some signs of infection, but could be the vaginal discharge got in it. If your symptoms persist with frequency after the STD treatment. Then fill the prescription of the Macrobid I am giving you today.

## 2018-04-28 LAB — HIV ANTIBODY (ROUTINE TESTING W REFLEX): HIV Screen 4th Generation wRfx: NONREACTIVE

## 2018-04-28 LAB — CERVICOVAGINAL ANCILLARY ONLY
BACTERIAL VAGINITIS: NEGATIVE
CHLAMYDIA, DNA PROBE: NEGATIVE
Candida vaginitis: POSITIVE — AB
NEISSERIA GONORRHEA: NEGATIVE
TRICH (WINDOWPATH): NEGATIVE

## 2018-04-29 ENCOUNTER — Telehealth (HOSPITAL_COMMUNITY): Payer: Self-pay

## 2018-04-29 MED ORDER — VALACYCLOVIR HCL 1 G PO TABS: 1000.0000 mg | ORAL_TABLET | Freq: Three times a day (TID) | ORAL | 0 refills | Status: AC

## 2018-04-29 MED ORDER — FLUCONAZOLE 150 MG PO TABS: 150.0000 mg | ORAL_TABLET | Freq: Every day | ORAL | 0 refills | Status: DC

## 2018-05-01 LAB — HSV CULTURE AND TYPING

## 2018-05-02 ENCOUNTER — Telehealth (HOSPITAL_COMMUNITY): Payer: Self-pay | Admitting: Emergency Medicine

## 2018-05-02 NOTE — Telephone Encounter (Signed)
Herpes screening is positive for HSV type 1 , Pt needs education on Herpes and safe sex practices. [facility].  Candida (yeast) is positive.  Prescription for fluconazole was given at the urgent care visit.     Pt contacted and made aware. All questions answered.

## 2018-07-08 ENCOUNTER — Ambulatory Visit (HOSPITAL_COMMUNITY)
Admission: EM | Admit: 2018-07-08 | Discharge: 2018-07-08 | Disposition: A | Discharge status: Home / Self Care | Payer: Self-pay | Attending: Physician Assistant | Admitting: Physician Assistant

## 2018-07-08 ENCOUNTER — Encounter (HOSPITAL_COMMUNITY): Payer: Self-pay | Admitting: *Deleted

## 2018-07-08 ENCOUNTER — Emergency Department (HOSPITAL_COMMUNITY): Payer: Self-pay

## 2018-07-08 ENCOUNTER — Emergency Department (HOSPITAL_COMMUNITY)
Admission: EM | Admit: 2018-07-08 | Discharge: 2018-07-08 | Disposition: A | Discharge status: Home / Self Care | Payer: Self-pay | Attending: Emergency Medicine | Admitting: Emergency Medicine

## 2018-07-08 ENCOUNTER — Other Ambulatory Visit: Payer: Self-pay

## 2018-07-08 DIAGNOSIS — R519 Headache, unspecified: Secondary | ICD-10-CM

## 2018-07-08 DIAGNOSIS — Z79899 Other long term (current) drug therapy: Secondary | ICD-10-CM | POA: Insufficient documentation

## 2018-07-08 DIAGNOSIS — N898 Other specified noninflammatory disorders of vagina: Secondary | ICD-10-CM | POA: Insufficient documentation

## 2018-07-08 DIAGNOSIS — Z91040 Latex allergy status: Secondary | ICD-10-CM | POA: Insufficient documentation

## 2018-07-08 DIAGNOSIS — R51 Headache: Secondary | ICD-10-CM | POA: Insufficient documentation

## 2018-07-08 DIAGNOSIS — Z202 Contact with and (suspected) exposure to infections with a predominantly sexual mode of transmission: Secondary | ICD-10-CM | POA: Insufficient documentation

## 2018-07-08 LAB — BASIC METABOLIC PANEL
Anion gap: 10 (ref 5–15)
BUN: 6 mg/dL (ref 6–20)
CO2: 20 mmol/L — ABNORMAL LOW (ref 22–32)
Calcium: 10.4 mg/dL — ABNORMAL HIGH (ref 8.9–10.3)
Chloride: 106 mmol/L (ref 98–111)
Creatinine, Ser: 0.75 mg/dL (ref 0.44–1.00)
GFR calc Af Amer: >60 mL/min (ref >60)
GFR calc non Af Amer: >60 mL/min (ref >60)
Glucose, Bld: 82 mg/dL (ref 70–99)
Potassium: 3.6 mmol/L (ref 3.5–5.1)
Sodium: 136 mmol/L (ref 135–145)

## 2018-07-08 LAB — CBC
HCT: 47.9 % — ABNORMAL HIGH (ref 36.0–46.0)
Hemoglobin: 15.2 g/dL — ABNORMAL HIGH (ref 12.0–15.0)
MCH: 28.2 pg (ref 26.0–34.0)
MCHC: 31.7 g/dL (ref 30.0–36.0)
MCV: 88.9 fL (ref 80.0–100.0)
NRBC: 0 % (ref 0.0–0.2)
Platelets: 420 10*3/uL — ABNORMAL HIGH (ref 150–400)
RBC: 5.39 MIL/uL — ABNORMAL HIGH (ref 3.87–5.11)
RDW: 12.9 % (ref 11.5–15.5)
WBC: 12.1 10*3/uL — ABNORMAL HIGH (ref 4.0–10.5)

## 2018-07-08 LAB — TROPONIN I

## 2018-07-08 LAB — I-STAT BETA HCG BLOOD, ED (MC, WL, AP ONLY): I-stat hCG, quantitative: <5 m[IU]/mL (ref <5)

## 2018-07-08 MED ORDER — METRONIDAZOLE 500 MG PO TABS: 500.0000 mg | ORAL_TABLET | Freq: Two times a day (BID) | ORAL | 0 refills | Status: DC

## 2018-07-08 MED ORDER — IBUPROFEN 800 MG PO TABS: 800.0000 mg | ORAL_TABLET | Freq: Three times a day (TID) | ORAL | 0 refills | Status: DC | PRN

## 2018-07-08 MED ORDER — KETOROLAC TROMETHAMINE 30 MG/ML IJ SOLN
60.0000 mg | Freq: Once | INTRAMUSCULAR | Status: AC
  Administered 2018-07-08: 60 mg via INTRAMUSCULAR
  Filled 2018-07-08: qty 2

## 2018-07-08 NOTE — Discharge Instructions (Addendum)
Follow-up with your family doctor to recheck your blood pressure
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
## 2018-07-08 NOTE — Discharge Instructions (Signed)
Start flagyl for BV, this will also cover for trichomonas. Cytology sent, you will be contacted with any positive results that requires further treatment. Refrain from sexual activity and alcohol use for the next 7 days. Monitor for any worsening of symptoms, fever, abdominal pain, nausea, vomiting, to follow up for reevaluation.

## 2018-07-08 NOTE — ED Triage Notes (Signed)
C/o vaginal discharge with foul odor onset 1 week ago.

## 2018-07-08 NOTE — ED Triage Notes (Signed)
Pt endorses chest tightness x 1 hour with dizziness, headache x 1 week intermittently. No hx of migraines. Denies shob. 1 episode of n/v. VSS

## 2018-07-08 NOTE — ED Notes (Signed)
Patient verbalizes understanding of discharge instructions. Opportunity for questioning and answers were provided. Armband removed by staff, pt discharged from ED.  

## 2018-07-11 LAB — CERVICOVAGINAL ANCILLARY ONLY
CHLAMYDIA, DNA PROBE: NEGATIVE
NEISSERIA GONORRHEA: NEGATIVE
Trichomonas: NEGATIVE

## 2018-07-13 DIAGNOSIS — F419 Anxiety disorder, unspecified: Secondary | ICD-10-CM | POA: Diagnosis not present

## 2018-07-14 DIAGNOSIS — B349 Viral infection, unspecified: Secondary | ICD-10-CM | POA: Diagnosis not present

## 2018-07-14 DIAGNOSIS — J029 Acute pharyngitis, unspecified: Secondary | ICD-10-CM | POA: Diagnosis not present

## 2018-09-22 DIAGNOSIS — N898 Other specified noninflammatory disorders of vagina: Secondary | ICD-10-CM | POA: Diagnosis not present

## 2018-10-20 DIAGNOSIS — L732 Hidradenitis suppurativa: Secondary | ICD-10-CM | POA: Diagnosis not present

## 2018-10-23 ENCOUNTER — Emergency Department (HOSPITAL_COMMUNITY)
Admission: EM | Admit: 2018-10-23 | Discharge: 2018-10-23 | Disposition: A | Discharge status: Home / Self Care | Payer: BC Managed Care – PPO | Attending: Emergency Medicine | Admitting: Emergency Medicine

## 2018-10-23 ENCOUNTER — Encounter (HOSPITAL_COMMUNITY): Payer: Self-pay | Admitting: *Deleted

## 2018-10-23 ENCOUNTER — Other Ambulatory Visit: Payer: Self-pay

## 2018-10-23 DIAGNOSIS — L02411 Cutaneous abscess of right axilla: Secondary | ICD-10-CM | POA: Insufficient documentation

## 2018-10-23 DIAGNOSIS — Z91040 Latex allergy status: Secondary | ICD-10-CM | POA: Insufficient documentation

## 2018-10-23 DIAGNOSIS — R2231 Localized swelling, mass and lump, right upper limb: Secondary | ICD-10-CM | POA: Diagnosis not present

## 2018-10-23 MED ORDER — LIDOCAINE-EPINEPHRINE (PF) 2 %-1:200000 IJ SOLN
10.0000 mL | Freq: Once | INTRAMUSCULAR | Status: AC
  Administered 2018-10-23: 10 mL
  Filled 2018-10-23: qty 20

## 2018-10-23 MED ORDER — DOXYCYCLINE HYCLATE 100 MG PO CAPS: 100.0000 mg | ORAL_CAPSULE | Freq: Two times a day (BID) | ORAL | 0 refills | Status: DC

## 2018-10-23 MED ORDER — HYDROCODONE-ACETAMINOPHEN 5-325 MG PO TABS: 1.0000 | ORAL_TABLET | Freq: Four times a day (QID) | ORAL | 0 refills | Status: AC | PRN

## 2018-10-23 MED ORDER — BACITRACIN ZINC 500 UNIT/GM EX OINT: TOPICAL_OINTMENT | Freq: Once | CUTANEOUS | Status: DC

## 2018-10-23 NOTE — ED Notes (Signed)
Patient verbalizes understanding of discharge instructions. Opportunity for questioning and answers were provided. Armband removed by staff, pt discharged from ED.  

## 2018-10-23 NOTE — ED Triage Notes (Signed)
Pt reports having abscess under right arm, small abscess on right elbow and on lower back.
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
## 2018-10-23 NOTE — Discharge Instructions (Signed)
Thank you for allowing me to care for you today. Please return to the emergency department if you have new or worsening symptoms. Take your medications as instructed.  ° °

## 2018-12-20 ENCOUNTER — Other Ambulatory Visit: Payer: Self-pay

## 2018-12-20 DIAGNOSIS — Z20822 Contact with and (suspected) exposure to covid-19: Secondary | ICD-10-CM

## 2018-12-22 LAB — NOVEL CORONAVIRUS, NAA: SARS-CoV-2, NAA: NOT DETECTED

## 2019-01-07 ENCOUNTER — Other Ambulatory Visit: Payer: Self-pay

## 2019-01-07 ENCOUNTER — Encounter (HOSPITAL_COMMUNITY): Payer: Self-pay

## 2019-01-07 ENCOUNTER — Ambulatory Visit (HOSPITAL_COMMUNITY)
Admission: EM | Admit: 2019-01-07 | Discharge: 2019-01-07 | Disposition: A | Discharge status: Home / Self Care | Payer: BC Managed Care – PPO | Attending: Emergency Medicine | Admitting: Emergency Medicine

## 2019-01-07 DIAGNOSIS — N939 Abnormal uterine and vaginal bleeding, unspecified: Secondary | ICD-10-CM

## 2019-01-07 DIAGNOSIS — Z202 Contact with and (suspected) exposure to infections with a predominantly sexual mode of transmission: Secondary | ICD-10-CM

## 2019-01-07 MED ORDER — CEFTRIAXONE SODIUM 250 MG IJ SOLR
250.0000 mg | Freq: Once | INTRAMUSCULAR | Status: AC
  Administered 2019-01-07: 250 mg via INTRAMUSCULAR

## 2019-01-07 MED ORDER — AZITHROMYCIN 250 MG PO TABS
1000.0000 mg | ORAL_TABLET | Freq: Once | ORAL | Status: AC
  Administered 2019-01-07: 1000 mg via ORAL

## 2019-01-07 MED ORDER — METRONIDAZOLE 500 MG PO TABS: 500.0000 mg | ORAL_TABLET | Freq: Two times a day (BID) | ORAL | 0 refills | Status: DC

## 2019-01-07 NOTE — Discharge Instructions (Addendum)
You were given 2 antibiotics today, Rocephin and Zithromax.  Take the prescribed metronidazole twice a day for 7 days.    Do not have sex for 7 days.    Your STD tests are pending.  If your test results are positive, we will call you.  You may need additional treatment and your partner may also need treatment.

## 2019-01-07 NOTE — ED Triage Notes (Signed)
Pt present vaginal bleeding while having intercourse. Pt states that she is not having any foul odor or discharge. Symptoms started 1 week ago. At this time there has been no bleeding.
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
## 2019-01-12 ENCOUNTER — Telehealth (HOSPITAL_COMMUNITY): Payer: Self-pay | Admitting: Emergency Medicine

## 2019-01-12 NOTE — Telephone Encounter (Signed)
Pt called asking for results. Cytology called, the results thus far are.  BV - pos Yeast - neg Trich - neg  GC/chlam not resulted yet. Pt given results, was treated for BV, and Gon/Chla.

## 2019-01-13 LAB — CERVICOVAGINAL ANCILLARY ONLY
Candida vaginitis: NEGATIVE
Chlamydia: NEGATIVE
Neisseria Gonorrhea: NEGATIVE
Trichomonas: NEGATIVE

## 2019-02-03 ENCOUNTER — Other Ambulatory Visit: Payer: Self-pay

## 2019-02-03 ENCOUNTER — Emergency Department (HOSPITAL_COMMUNITY)
Admission: EM | Admit: 2019-02-03 | Discharge: 2019-02-03 | Disposition: A | Discharge status: Home / Self Care | Payer: Self-pay | Attending: Emergency Medicine | Admitting: Emergency Medicine

## 2019-02-03 ENCOUNTER — Encounter (HOSPITAL_COMMUNITY): Payer: Self-pay | Admitting: Emergency Medicine

## 2019-02-03 DIAGNOSIS — R51 Headache: Secondary | ICD-10-CM | POA: Insufficient documentation

## 2019-02-03 DIAGNOSIS — R519 Headache, unspecified: Secondary | ICD-10-CM

## 2019-02-03 DIAGNOSIS — Z91040 Latex allergy status: Secondary | ICD-10-CM | POA: Insufficient documentation

## 2019-02-03 DIAGNOSIS — Z79899 Other long term (current) drug therapy: Secondary | ICD-10-CM | POA: Insufficient documentation

## 2019-02-03 LAB — CBC WITH DIFFERENTIAL/PLATELET
Abs Immature Granulocytes: 0.02 10*3/uL (ref 0.00–0.07)
Basophils Absolute: 0.1 10*3/uL (ref 0.0–0.1)
Basophils Relative: 1 %
Eosinophils Absolute: 0 10*3/uL (ref 0.0–0.5)
Eosinophils Relative: 1 %
HCT: 47.9 % — ABNORMAL HIGH (ref 36.0–46.0)
Hemoglobin: 15.9 g/dL — ABNORMAL HIGH (ref 12.0–15.0)
Immature Granulocytes: 0 %
Lymphocytes Relative: 25 %
Lymphs Abs: 1.9 10*3/uL (ref 0.7–4.0)
MCH: 29.3 pg (ref 26.0–34.0)
MCHC: 33.2 g/dL (ref 30.0–36.0)
MCV: 88.2 fL (ref 80.0–100.0)
Monocytes Absolute: 0.5 10*3/uL (ref 0.1–1.0)
Monocytes Relative: 7 %
Neutro Abs: 5.1 10*3/uL (ref 1.7–7.7)
Neutrophils Relative %: 66 %
Platelets: 356 10*3/uL (ref 150–400)
RBC: 5.43 MIL/uL — ABNORMAL HIGH (ref 3.87–5.11)
RDW: 13.1 % (ref 11.5–15.5)
WBC: 7.6 10*3/uL (ref 4.0–10.5)
nRBC: 0 % (ref 0.0–0.2)

## 2019-02-03 LAB — BASIC METABOLIC PANEL
Anion gap: 8 (ref 5–15)
BUN: <5 mg/dL — ABNORMAL LOW (ref 6–20)
CO2: 24 mmol/L (ref 22–32)
Calcium: 10.7 mg/dL — ABNORMAL HIGH (ref 8.9–10.3)
Chloride: 105 mmol/L (ref 98–111)
Creatinine, Ser: 0.79 mg/dL (ref 0.44–1.00)
GFR calc Af Amer: >60 mL/min (ref >60)
GFR calc non Af Amer: >60 mL/min (ref >60)
Glucose, Bld: 89 mg/dL (ref 70–99)
Potassium: 4.3 mmol/L (ref 3.5–5.1)
Sodium: 137 mmol/L (ref 135–145)

## 2019-02-03 LAB — I-STAT BETA HCG BLOOD, ED (MC, WL, AP ONLY): I-stat hCG, quantitative: <5 m[IU]/mL (ref <5)

## 2019-02-03 MED ORDER — PROCHLORPERAZINE MALEATE 5 MG PO TABS
10.0000 mg | ORAL_TABLET | Freq: Once | ORAL | Status: AC
  Administered 2019-02-03: 10 mg via ORAL
  Filled 2019-02-03: qty 2

## 2019-02-03 MED ORDER — KETOROLAC TROMETHAMINE 30 MG/ML IJ SOLN
30.0000 mg | Freq: Once | INTRAMUSCULAR | Status: AC
  Administered 2019-02-03: 30 mg via INTRAMUSCULAR
  Filled 2019-02-03: qty 1

## 2019-02-03 MED ORDER — DIPHENHYDRAMINE HCL 25 MG PO CAPS
50.0000 mg | ORAL_CAPSULE | Freq: Once | ORAL | Status: AC
  Administered 2019-02-03: 50 mg via ORAL
  Filled 2019-02-03: qty 2

## 2019-02-03 NOTE — ED Triage Notes (Signed)
Pt reports headache for the last week, worsened over the last 2 days. No neuro deficits. Pt has hx of HTN.

## 2019-02-03 NOTE — Discharge Instructions (Addendum)
Please follow up with [facility] for your primary care needs
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
## 2019-03-01 ENCOUNTER — Other Ambulatory Visit: Payer: Self-pay

## 2019-03-01 DIAGNOSIS — Z20822 Contact with and (suspected) exposure to covid-19: Secondary | ICD-10-CM

## 2019-03-02 LAB — NOVEL CORONAVIRUS, NAA: SARS-CoV-2, NAA: DETECTED — AB

## 2019-03-10 ENCOUNTER — Other Ambulatory Visit: Payer: Self-pay

## 2019-03-10 DIAGNOSIS — Z20822 Contact with and (suspected) exposure to covid-19: Secondary | ICD-10-CM

## 2019-03-12 LAB — NOVEL CORONAVIRUS, NAA: SARS-CoV-2, NAA: NOT DETECTED

## 2019-04-07 ENCOUNTER — Encounter (HOSPITAL_COMMUNITY): Payer: Self-pay

## 2019-04-07 ENCOUNTER — Other Ambulatory Visit: Payer: Self-pay

## 2019-04-07 ENCOUNTER — Ambulatory Visit (HOSPITAL_COMMUNITY)
Admission: EM | Admit: 2019-04-07 | Discharge: 2019-04-07 | Disposition: A | Discharge status: Home / Self Care | Payer: Self-pay | Attending: Family Medicine | Admitting: Family Medicine

## 2019-04-07 DIAGNOSIS — N939 Abnormal uterine and vaginal bleeding, unspecified: Secondary | ICD-10-CM | POA: Insufficient documentation

## 2019-04-07 DIAGNOSIS — R350 Frequency of micturition: Secondary | ICD-10-CM | POA: Insufficient documentation

## 2019-04-07 DIAGNOSIS — Z3202 Encounter for pregnancy test, result negative: Secondary | ICD-10-CM

## 2019-04-07 DIAGNOSIS — Z202 Contact with and (suspected) exposure to infections with a predominantly sexual mode of transmission: Secondary | ICD-10-CM | POA: Insufficient documentation

## 2019-04-07 LAB — POCT URINALYSIS DIP (DEVICE)
Glucose, UA: NEGATIVE mg/dL
Ketones, ur: NEGATIVE mg/dL
Nitrite: NEGATIVE
Protein, ur: 100 mg/dL — AB
Specific Gravity, Urine: 1.025 (ref 1.005–1.030)
Urobilinogen, UA: 1 mg/dL (ref 0.0–1.0)
pH: 7.5 (ref 5.0–8.0)

## 2019-04-07 LAB — POCT PREGNANCY, URINE: Preg Test, Ur: NEGATIVE

## 2019-04-07 LAB — POC URINE PREG, ED: Preg Test, Ur: NEGATIVE

## 2019-04-07 MED ORDER — FLUCONAZOLE 200 MG PO TABS: 200.0000 mg | ORAL_TABLET | Freq: Once | ORAL | 0 refills | Status: AC

## 2019-04-07 MED ORDER — AZITHROMYCIN 250 MG PO TABS
1000.0000 mg | ORAL_TABLET | Freq: Once | ORAL | Status: AC
  Administered 2019-04-07: 15:00:00 1000 mg via ORAL

## 2019-04-07 MED ORDER — CEFTRIAXONE SODIUM 250 MG IJ SOLR
INTRAMUSCULAR | Status: AC
  Filled 2019-04-07: qty 250

## 2019-04-07 MED ORDER — NITROFURANTOIN MONOHYD MACRO 100 MG PO CAPS: 100.0000 mg | ORAL_CAPSULE | Freq: Two times a day (BID) | ORAL | 0 refills | Status: AC

## 2019-04-07 MED ORDER — AZITHROMYCIN 250 MG PO TABS
ORAL_TABLET | ORAL | Status: AC
  Filled 2019-04-07: qty 4

## 2019-04-07 MED ORDER — CEFTRIAXONE SODIUM 250 MG IJ SOLR
250.0000 mg | Freq: Once | INTRAMUSCULAR | Status: AC
  Administered 2019-04-07: 250 mg via INTRAMUSCULAR

## 2019-04-07 NOTE — ED Triage Notes (Signed)
Pt present to the UC fro STD test. Pt report her boyfriend told her today he has Chlamydia. Pt states she is having vaginal bleeding x 2 month. Pt sates she feels the need to urinates more often x 3 days aprox.
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
## 2019-04-07 NOTE — Discharge Instructions (Addendum)
We have treated you today for gonorrhea and chlamydia.  Will notify of any positive findings and if any changes to treatment are needed.   We will start UTI treatment. Drink plenty of water to empty bladder regularly.  If your culture is negative you may stop the antibiotic.  Please withhold from intercourse for the next week. Please use condoms to prevent STD's.   Follow up with your gynecologist if your bleeding persists.

## 2019-04-09 LAB — URINE CULTURE: Culture: 80000 — AB

## 2019-04-11 LAB — CERVICOVAGINAL ANCILLARY ONLY
Bacterial vaginitis: POSITIVE — AB
Candida vaginitis: NEGATIVE
Chlamydia: POSITIVE — AB
Neisseria Gonorrhea: NEGATIVE
Trichomonas: NEGATIVE

## 2019-04-12 ENCOUNTER — Telehealth: Payer: Self-pay | Admitting: Emergency Medicine

## 2019-04-12 MED ORDER — FLUCONAZOLE 150 MG PO TABS: 150.0000 mg | ORAL_TABLET | Freq: Once | ORAL | 0 refills | Status: AC

## 2019-04-12 MED ORDER — METRONIDAZOLE 500 MG PO TABS: 500.0000 mg | ORAL_TABLET | Freq: Two times a day (BID) | ORAL | 0 refills | Status: AC

## 2019-04-12 NOTE — Telephone Encounter (Signed)
Bacterial vaginosis is positive. This was not treated at the urgent care visit.  Flagyl 500 mg BID x 7 days #14 no refills sent to patients pharmacy of choice.    Chlamydia is positive.  This was treated at the urgent care visit with po zithromax 1g.  Pt needs education to please refrain from sexual intercourse for 7 days to give the medicine time to work.  Sexual partners need to be notified and tested/treated.  Condoms may reduce risk of reinfection.  Recheck or followup with PCP for further evaluation if symptoms are not improving.  [facility] notified.  Patient contacted by phone and made aware of    results. Pt verbalized understanding and had all questions answered.  Pt also requesting medication for yeast to cover the whole time period of medications. Will send two doses.

## 2019-05-12 HISTORY — PX: LIPOSUCTION TRUNK: SUR833

## 2019-06-20 ENCOUNTER — Other Ambulatory Visit: Payer: Self-pay

## 2019-06-20 ENCOUNTER — Encounter (HOSPITAL_COMMUNITY): Payer: Self-pay

## 2019-06-20 ENCOUNTER — Ambulatory Visit (HOSPITAL_COMMUNITY)
Admission: EM | Admit: 2019-06-20 | Discharge: 2019-06-20 | Disposition: A | Discharge status: Home / Self Care | Payer: 59 | Attending: Family Medicine | Admitting: Family Medicine

## 2019-06-20 DIAGNOSIS — R809 Proteinuria, unspecified: Secondary | ICD-10-CM | POA: Diagnosis present

## 2019-06-20 DIAGNOSIS — Z711 Person with feared health complaint in whom no diagnosis is made: Secondary | ICD-10-CM | POA: Insufficient documentation

## 2019-06-20 DIAGNOSIS — N898 Other specified noninflammatory disorders of vagina: Secondary | ICD-10-CM

## 2019-06-20 LAB — POCT URINALYSIS DIP (DEVICE)
Bilirubin Urine: NEGATIVE
Glucose, UA: NEGATIVE mg/dL
Ketones, ur: NEGATIVE mg/dL
Nitrite: NEGATIVE
Protein, ur: 30 mg/dL — AB
Specific Gravity, Urine: >=1.03 (ref 1.005–1.030)
Urobilinogen, UA: 0.2 mg/dL (ref 0.0–1.0)
pH: 7 (ref 5.0–8.0)

## 2019-06-20 MED ORDER — DOXYCYCLINE HYCLATE 100 MG PO CAPS: 100.0000 mg | ORAL_CAPSULE | Freq: Two times a day (BID) | ORAL | 0 refills | Status: DC

## 2019-06-20 MED ORDER — CEFTRIAXONE SODIUM 500 MG IJ SOLR
INTRAMUSCULAR | Status: AC
  Filled 2019-06-20: qty 500

## 2019-06-20 MED ORDER — CEFTRIAXONE SODIUM 500 MG IJ SOLR
500.0000 mg | Freq: Once | INTRAMUSCULAR | Status: AC
  Administered 2019-06-20: 500 mg via INTRAMUSCULAR

## 2019-06-20 MED ORDER — METRONIDAZOLE 500 MG PO TABS: 500.0000 mg | ORAL_TABLET | Freq: Two times a day (BID) | ORAL | 0 refills | Status: DC

## 2019-06-20 NOTE — ED Triage Notes (Signed)
Patient presents to Urgent Care with complaints of having protein in her urine and some signs of infection (WBCs) since trying to be cleared for surgery. Patient reports she also needs testing and treatment for possible chlamydia that she does not think was cleared of in the past (pt has been having vaginal odor). Patient is familiar with getting her results off of MyChart, would like UA and STD testing. Pt also just got a nexplanon removed and is still spotting.

## 2019-06-20 NOTE — Discharge Instructions (Addendum)
You have been given the following today for treatment of suspected gonorrhea and/or chlamydia:  cefTRIAXone (ROCEPHIN) injection 500 mg  Please pick up your prescription for doxycycline 100 mg and begin taking twice daily for the next seven (7) days.  Even though we have treated you today, we have sent testing for sexually transmitted infections. We will notify you of any positive results once they are received. If required, we will prescribe any medications you might need.  Please refrain from all sexual activity for at least the next seven days.  We have also sent your urine sample for a culture. We will inform you of any significant results.

## 2019-06-20 NOTE — ED Provider Notes (Signed)
[facility]   [id number] 06/20/19 Arrival Time: 7829  ASSESSMENT & PLAN:  1. Concern about STD in female without diagnosis   2. Vaginal discharge   3. Proteinuria, unspecified type     See AVS for d/c information. Stable mild proteinuria; may f/u with PCP regarding this. Urine culture sent. Vaginal cytology sent; desires empiric treatment.  Meds ordered this encounter  Medications  . cefTRIAXone (ROCEPHIN) injection 500 mg  . doxycycline (VIBRAMYCIN) 100 MG capsule    Sig: Take 1 capsule (100 mg total) by mouth 2 (two) times daily.    Dispense:  14 capsule    Refill:  0  . metroNIDAZOLE (FLAGYL) 500 MG tablet    Sig: Take 1 tablet (500 mg total) by mouth 2 (two) times daily.    Dispense:  14 tablet    Refill:  0      Discharge Instructions     You have been given the following today for treatment of suspected gonorrhea and/or chlamydia:  cefTRIAXone (ROCEPHIN) injection 500 mg  Please pick up your prescription for doxycycline 100 mg and begin taking twice daily for the next seven (7) days.  Even though we have treated you today, we have sent testing for sexually transmitted infections. We will notify you of any positive results once they are received. If required, we will prescribe any medications you might need.  Please refrain from all sexual activity for at least the next seven days.  We have also sent your urine sample for a culture. We will inform you of any significant results.    Pending: Labs Reviewed  POCT URINALYSIS DIP (DEVICE) - Abnormal; Notable for the following components:      Result Value   Hgb urine dipstick LARGE (*)    Protein, ur 30 (*)    Leukocytes,Ua TRACE (*)    All other components within normal limits  URINE CULTURE  CERVICOVAGINAL ANCILLARY ONLY    Will notify of any positive results. Instructed to refrain from sexual activity for at least seven days.  Reviewed expectations re: course of current medical issues.  Questions answered. Outlined signs and symptoms indicating need for more acute intervention. Patient verbalized understanding. After Visit Summary given.   SUBJECTIVE:  Anita Allen is a 29 y.o. female who requests STD testing and U/A. Reports h/o treated chlamydia and h/o frequent BV. Vaginal discharge with fishy odor present recently. Sexually active. Afebrile. No pelvic pain. Reports U/A at PCP office "showed that I may be fighting off an infection" along with "some protein in my urine". No urinary frequency, dysuria reported. Recently had nexplanon removed secondary to persistent vaginal bleeding; still spotting. Normal PO intake wihout n/v. No genital rashes or lesions. No new medications.  No LMP recorded. (Menstrual status: Oral contraceptives).   OBJECTIVE:  Vitals:   06/20/19 1046  BP: 127/87  Pulse: 84  Resp: 19  Temp: 98.4 F (36.9 C)  TempSrc: Oral  SpO2: 100%     General appearance: alert, cooperative, appears stated age and no distress Resp: unlabored respirations; speaks full sentences without difficulty Back: no CVA tenderness reported Abdomen: soft GU: deferred Skin: warm and dry Psychological: alert and cooperative; normal mood and affect.  Results for orders placed or performed during the hospital encounter of 06/20/19  POCT urinalysis dip (device)  Result Value Ref Range   Glucose, UA NEGATIVE NEGATIVE mg/dL   Bilirubin Urine NEGATIVE NEGATIVE   Ketones, ur NEGATIVE NEGATIVE mg/dL   Specific Gravity, Urine >=1.030 1.005 -  1.030   Hgb urine dipstick LARGE (A) NEGATIVE   pH 7.0 5.0 - 8.0   Protein, ur 30 (A) NEGATIVE mg/dL   Urobilinogen, UA 0.2 0.0 - 1.0 mg/dL   Nitrite NEGATIVE NEGATIVE   Leukocytes,Ua TRACE (A) NEGATIVE    Labs Reviewed  POCT URINALYSIS DIP (DEVICE) - Abnormal; Notable for the following components:      Result Value   Hgb urine dipstick LARGE (*)    Protein, ur 30 (*)    Leukocytes,Ua TRACE (*)    All other components  within normal limits  URINE CULTURE  CERVICOVAGINAL ANCILLARY ONLY    Allergies  Allergen Reactions  . Augmentin [Amoxicillin-Pot Clavulanate] Hives and Other (See Comments)    Pt has taken Ceftriaxone in the past (??)  . Latex Rash    Vaginal irritation    Past Medical History:  Diagnosis Date  . BV (bacterial vaginosis)   . Gonorrhea contact, treated   . H/O dilation and curettage   . Hx of trichomoniasis   . Molar pregnancy 12-2013  . PCOS (polycystic ovarian syndrome)   . Vaginal delivery 2012, 2013  . Yeast infection    Family History  Problem Relation Age of Onset  . Hypertension Mother   . Heart attack Mother   . Heart disease Father   . Hypertension Father   . Heart attack Father    Social History   Socioeconomic History  . Marital status: Married    Spouse name: Not on file  . Number of children: Not on file  . Years of education: Not on file  . Highest education level: Not on file  Occupational History  . Not on file  Tobacco Use  . Smoking status: Never Smoker  . Smokeless tobacco: Never Used  Substance and Sexual Activity  . Alcohol use: No  . Drug use: No  . Sexual activity: Yes    Birth control/protection: Condom  Other Topics Concern  . Not on file  Social History Narrative   ** Merged History Encounter **       Social Determinants of Health   Financial Resource Strain:   . Difficulty of Paying Living Expenses: Not on file  Food Insecurity:   . Worried About Charity fundraiser in the Last Year: Not on file  . Ran Out of Food in the Last Year: Not on file  Transportation Needs:   . Lack of Transportation (Medical): Not on file  . Lack of Transportation (Non-Medical): Not on file  Physical Activity:   . Days of Exercise per Week: Not on file  . Minutes of Exercise per Session: Not on file  Stress:   . Feeling of Stress : Not on file  Social Connections:   . Frequency of Communication with Friends and Family: Not on file  .  Frequency of Social Gatherings with Friends and Family: Not on file  . Attends Religious Services: Not on file  . Active Member of Clubs or Organizations: Not on file  . Attends Archivist Meetings: Not on file  . Marital Status: Not on file  Intimate Partner Violence:   . Fear of Current or Ex-Partner: Not on file  . Emotionally Abused: Not on file  . Physically Abused: Not on file  . Sexually Abused: Not on file          Vanessa Kick, MD 06/20/19 1152

## 2019-06-21 LAB — URINE CULTURE: Culture: 50000 — AB

## 2019-06-23 LAB — CERVICOVAGINAL ANCILLARY ONLY
Bacterial vaginitis: POSITIVE — AB
Candida vaginitis: NEGATIVE
Chlamydia: NEGATIVE
Neisseria Gonorrhea: NEGATIVE
Trichomonas: NEGATIVE

## 2019-10-20 ENCOUNTER — Encounter (HOSPITAL_COMMUNITY): Payer: Self-pay

## 2019-10-20 ENCOUNTER — Other Ambulatory Visit: Payer: Self-pay

## 2019-10-20 ENCOUNTER — Ambulatory Visit (HOSPITAL_COMMUNITY)
Admission: EM | Admit: 2019-10-20 | Discharge: 2019-10-20 | Disposition: A | Discharge status: Home / Self Care | Payer: HRSA Program | Attending: Internal Medicine | Admitting: Internal Medicine

## 2019-10-20 DIAGNOSIS — J309 Allergic rhinitis, unspecified: Secondary | ICD-10-CM

## 2019-10-20 DIAGNOSIS — Z8759 Personal history of other complications of pregnancy, childbirth and the puerperium: Secondary | ICD-10-CM | POA: Insufficient documentation

## 2019-10-20 DIAGNOSIS — E282 Polycystic ovarian syndrome: Secondary | ICD-10-CM | POA: Insufficient documentation

## 2019-10-20 DIAGNOSIS — R0982 Postnasal drip: Secondary | ICD-10-CM | POA: Diagnosis not present

## 2019-10-20 DIAGNOSIS — Z880 Allergy status to penicillin: Secondary | ICD-10-CM | POA: Diagnosis not present

## 2019-10-20 DIAGNOSIS — Z881 Allergy status to other antibiotic agents status: Secondary | ICD-10-CM | POA: Diagnosis not present

## 2019-10-20 DIAGNOSIS — Z20822 Contact with and (suspected) exposure to covid-19: Secondary | ICD-10-CM | POA: Diagnosis not present

## 2019-10-20 MED ORDER — FLUTICASONE PROPIONATE 50 MCG/ACT NA SUSP: 1.0000 | Freq: Every day | NASAL | 1 refills | Status: DC

## 2019-10-20 MED ORDER — LORATADINE 10 MG PO TABS: 10.0000 mg | ORAL_TABLET | Freq: Every day | ORAL | 1 refills | Status: DC

## 2019-10-20 MED ORDER — BENZONATATE 100 MG PO CAPS: 100.0000 mg | ORAL_CAPSULE | Freq: Three times a day (TID) | ORAL | 0 refills | Status: DC | PRN

## 2019-10-20 NOTE — ED Triage Notes (Addendum)
Pt c/o productive cough w/green mucous, sore throat, nasal and chest congestionx1 wk. Pt cough worse at night when laying down. Pt has had to sleep sitting up the past few nights. Pt denies SOB. Pt has non labored breathing. Lungs are clear.

## 2019-10-21 LAB — SARS CORONAVIRUS 2 (TAT 6-24 HRS): SARS Coronavirus 2: NEGATIVE
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
## 2020-02-13 ENCOUNTER — Ambulatory Visit: Payer: Medicaid Other | Attending: Internal Medicine

## 2020-02-13 DIAGNOSIS — Z23 Encounter for immunization: Secondary | ICD-10-CM

## 2020-02-13 NOTE — Progress Notes (Signed)
   Covid-19 Vaccination Clinic  Name:  Anita Allen    MRN: [id number] DOB: 16-Aug-1990  02/13/2020  [name] was observed post Covid-19 immunization for 15 minutes without incident. She was provided with Vaccine Information Sheet and instruction to access the V-Safe system.   [name] was instructed to call 911 with any severe reactions post vaccine: Marland Kitchen Difficulty breathing  . Swelling of face and throat  . A fast heartbeat  . A bad rash all over body  . Dizziness and weakness   Immunizations Administered    Name Date Dose VIS Date Route   JANSSEN COVID-19 VACCINE 02/13/2020  3:24 PM 0.5 mL 07/08/2019 Intramuscular   Manufacturer: [employer]   Lot: [id number]   NDC: 57846-962-95

## 2020-03-21 ENCOUNTER — Ambulatory Visit (HOSPITAL_COMMUNITY): Admit: 2020-03-21 | Discharge status: Absent without leave | Payer: Medicaid Other

## 2020-03-21 ENCOUNTER — Ambulatory Visit (HOSPITAL_COMMUNITY)
Admission: EM | Admit: 2020-03-21 | Discharge: 2020-03-21 | Disposition: A | Discharge status: Home / Self Care | Payer: Self-pay | Attending: Urgent Care | Admitting: Urgent Care

## 2020-03-21 ENCOUNTER — Other Ambulatory Visit: Payer: Self-pay

## 2020-03-21 ENCOUNTER — Encounter (HOSPITAL_COMMUNITY): Payer: Self-pay

## 2020-03-21 DIAGNOSIS — Z7251 High risk heterosexual behavior: Secondary | ICD-10-CM | POA: Insufficient documentation

## 2020-03-21 DIAGNOSIS — N760 Acute vaginitis: Secondary | ICD-10-CM | POA: Insufficient documentation

## 2020-03-21 DIAGNOSIS — N898 Other specified noninflammatory disorders of vagina: Secondary | ICD-10-CM | POA: Insufficient documentation

## 2020-03-21 DIAGNOSIS — Z3202 Encounter for pregnancy test, result negative: Secondary | ICD-10-CM

## 2020-03-21 DIAGNOSIS — B9689 Other specified bacterial agents as the cause of diseases classified elsewhere: Secondary | ICD-10-CM | POA: Insufficient documentation

## 2020-03-21 LAB — POC URINE PREG, ED: Preg Test, Ur: NEGATIVE

## 2020-03-21 MED ORDER — METRONIDAZOLE 500 MG PO TABS
ORAL_TABLET | ORAL | Status: AC
  Filled 2020-03-21: qty 4

## 2020-03-21 MED ORDER — ONDANSETRON 4 MG PO TBDP
ORAL_TABLET | ORAL | Status: AC
  Filled 2020-03-21: qty 1

## 2020-03-21 MED ORDER — METRONIDAZOLE 500 MG PO TABS
2000.0000 mg | ORAL_TABLET | Freq: Once | ORAL | Status: AC
  Administered 2020-03-21: 2000 mg via ORAL

## 2020-03-21 MED ORDER — ONDANSETRON 4 MG PO TBDP
8.0000 mg | ORAL_TABLET | Freq: Once | ORAL | Status: AC
  Administered 2020-03-21: 8 mg via ORAL

## 2020-03-21 NOTE — Discharge Instructions (Addendum)
We will let you know about your test results from today's cervical swab and if you need any further treatment.  We have given you a request medication here in our clinic for bacterial vaginosis.  This can cause significant nausea and vomiting and therefore we have also given you Zofran for nausea and vomiting.  Please sign up for my chart so that you can see your test results and will let you know if you need to come back for any particular treatment.

## 2020-03-21 NOTE — ED Provider Notes (Signed)
[facility]   MRN: [id number] DOB: 1990-07-16  Subjective:   Anita Allen is a 29 y.o. female presenting for 1 week history of persistent vaginal discharge.  Patient has had some slight spotting after sex for the past month.  Denies dyspareunia, fever, nausea, vomiting, abdominal pelvic pain. Has a hx of chlamydia in 2020, persistent BV.  Last positive test was in February of this past year, tested for BV only.  Patient states that she never picked up her medications.  She has only been with the same partner since then as well.  No current facility-administered medications for this encounter.  Current Outpatient Medications:  .  benzonatate (TESSALON) 100 MG capsule, Take 1 capsule (100 mg total) by mouth 3 (three) times daily as needed for cough., Disp: 30 capsule, Rfl: 0 .  fluticasone (FLONASE) 50 MCG/ACT nasal spray, Place 1 spray into both nostrils daily., Disp: 16 g, Rfl: 1 .  loratadine (CLARITIN) 10 MG tablet, Take 1 tablet (10 mg total) by mouth daily., Disp: 30 tablet, Rfl: 1   Allergies  Allergen Reactions  . Augmentin [Amoxicillin-Pot Clavulanate] Hives and Other (See Comments)    Pt has taken Ceftriaxone in the past (??)  . Latex Rash    Vaginal irritation    Past Medical History:  Diagnosis Date  . BV (bacterial vaginosis)   . Gonorrhea contact, treated   . H/O dilation and curettage   . Hx of trichomoniasis   . Molar pregnancy 12-2013  . PCOS (polycystic ovarian syndrome)   . Vaginal delivery 2012, 2013  . Yeast infection      Past Surgical History:  Procedure Laterality Date  . brazillian butt lift    . DILATION AND CURETTAGE OF UTERUS    . DILATION AND EVACUATION N/A 12/20/2013   Procedure: DILATATION AND EVACUATION;  Surgeon: Adam Phenix, MD;  Location: [facility];  Service: Gynecology;  Laterality: N/A;  . LIPOSUCTION TRUNK      Family History  Problem Relation Age of Onset  . Hypertension Mother   . Heart attack Mother   .  Heart disease Father   . Hypertension Father   . Heart attack Father     Social History   Tobacco Use  . Smoking status: Never Smoker  . Smokeless tobacco: Never Used  Vaping Use  . Vaping Use: Never used  Substance Use Topics  . Alcohol use: No  . Drug use: No    ROS   Objective:   Vitals: BP 112/77 (BP Location: Right Arm)   Pulse 82   Temp 98.4 F (36.9 C) (Oral)   Resp 18   LMP  (Within Months) Comment: 1 month  SpO2 96%   Physical Exam Constitutional:      General: She is not in acute distress.    Appearance: Normal appearance. She is well-developed. She is not ill-appearing, toxic-appearing or diaphoretic.  HENT:     Head: Normocephalic and atraumatic.     Nose: Nose normal.     Mouth/Throat:     Mouth: Mucous membranes are moist.     Pharynx: Oropharynx is clear.  Eyes:     General: No scleral icterus.       Right eye: No discharge.        Left eye: No discharge.     Extraocular Movements: Extraocular movements intact.     Conjunctiva/sclera: Conjunctivae normal.     Pupils: Pupils are equal, round, and reactive to light.  Cardiovascular:     Rate and Rhythm: Normal rate.  Pulmonary:     Effort: Pulmonary effort is normal.  Skin:    General: Skin is warm and dry.  Neurological:     General: No focal deficit present.     Mental Status: She is alert and oriented to person, place, and time.  Psychiatric:        Mood and Affect: Mood normal.        Behavior: Behavior normal.        Thought Content: Thought content normal.        Judgment: Judgment normal.     Results for orders placed or performed during the hospital encounter of 03/21/20 (from the past 24 hour(s))  POC urine pregnancy     Status: None   Collection Time: 03/21/20  7:23 PM  Result Value Ref Range   Preg Test, Ur NEGATIVE NEGATIVE    Assessment and Plan :   PDMP not reviewed this encounter.  1. BV (bacterial vaginosis)   2. Vaginal discharge   3. Unprotected sex      Had extensive discussion with patient about antibiotic stewardship.  Patient insisted on getting treated for gonorrhea, chlamydia because she did not want a return to the clinic.  I recommended treating her BV which she never picked up medication for.  Advised against antibiotic use without results.  Patient has stable vital signs and no symptoms consistent with pelvic inflammatory disease warranting empiric treatment.  Ultimately she was agreeable to waiting for results.  She requested treatment here with 2 g of metronidazole because she did not want to pick up medication at the pharmacy.  I obliged her and also give her Zofran to help with nausea and vomiting. Counseled patient on potential for adverse effects with medications prescribed/recommended today, ER and return-to-clinic precautions discussed, patient verbalized understanding.    Wallis Bamberg, PA-C 03/21/20 1950

## 2020-03-21 NOTE — ED Triage Notes (Signed)
Pt presents for STD's testing. Reports having white vaginal discharge x 1 week; spotting after having sex x 1 month, denies pain with sex. States she used boric acid vaginal suppository gave relief for  3-4 days, then symptoms came back.

## 2020-03-22 ENCOUNTER — Ambulatory Visit (HOSPITAL_COMMUNITY)
Admission: EM | Admit: 2020-03-22 | Discharge: 2020-03-22 | Disposition: A | Discharge status: Home / Self Care | Payer: Self-pay | Attending: Family Medicine | Admitting: Family Medicine

## 2020-03-22 ENCOUNTER — Other Ambulatory Visit: Payer: Self-pay

## 2020-03-22 ENCOUNTER — Ambulatory Visit (HOSPITAL_COMMUNITY): Admit: 2020-03-22 | Discharge status: Absent without leave | Payer: Medicaid Other

## 2020-03-22 DIAGNOSIS — Z719 Counseling, unspecified: Secondary | ICD-10-CM

## 2020-03-22 LAB — CERVICOVAGINAL ANCILLARY ONLY
Bacterial Vaginitis (gardnerella): POSITIVE — AB
Candida Glabrata: NEGATIVE
Candida Vaginitis: NEGATIVE
Chlamydia: POSITIVE — AB
Comment: NEGATIVE
Comment: NEGATIVE
Comment: NEGATIVE
Comment: NEGATIVE
Comment: NEGATIVE
Comment: NORMAL
Neisseria Gonorrhea: NEGATIVE
Trichomonas: NEGATIVE

## 2020-03-22 MED ORDER — DOXYCYCLINE HYCLATE 100 MG PO CAPS: 100.0000 mg | ORAL_CAPSULE | Freq: Two times a day (BID) | ORAL | 0 refills | Status: AC

## 2020-03-22 MED ORDER — DOXYCYCLINE HYCLATE 100 MG PO TABS
ORAL_TABLET | ORAL | Status: AC
  Filled 2020-03-22: qty 1

## 2020-03-22 MED ORDER — CEFTRIAXONE SODIUM 500 MG IJ SOLR
500.0000 mg | Freq: Once | INTRAMUSCULAR | Status: AC
  Administered 2020-03-22: 500 mg via INTRAMUSCULAR

## 2020-03-22 MED ORDER — DOXYCYCLINE HYCLATE 100 MG PO CAPS: 100.0000 mg | ORAL_CAPSULE | Freq: Two times a day (BID) | ORAL | 0 refills | Status: DC

## 2020-03-22 MED ORDER — CEFTRIAXONE SODIUM 500 MG IJ SOLR
INTRAMUSCULAR | Status: AC
  Filled 2020-03-22: qty 500

## 2020-03-22 MED ORDER — DOXYCYCLINE HYCLATE 100 MG PO TABS
100.0000 mg | ORAL_TABLET | Freq: Once | ORAL | Status: AC
  Administered 2020-03-22: 100 mg via ORAL

## 2020-05-20 DIAGNOSIS — Z1152 Encounter for screening for COVID-19: Secondary | ICD-10-CM | POA: Diagnosis not present

## 2020-05-28 ENCOUNTER — Inpatient Hospital Stay (HOSPITAL_COMMUNITY)
Admission: AD | Admit: 2020-05-28 | Discharge: 2020-05-28 | Disposition: A | Discharge status: Home / Self Care | Payer: Self-pay | Attending: Obstetrics and Gynecology | Admitting: Obstetrics and Gynecology

## 2020-05-28 ENCOUNTER — Encounter (HOSPITAL_COMMUNITY): Payer: Self-pay | Admitting: Obstetrics and Gynecology

## 2020-05-28 ENCOUNTER — Inpatient Hospital Stay (HOSPITAL_COMMUNITY): Payer: Self-pay

## 2020-05-28 ENCOUNTER — Other Ambulatory Visit: Payer: Self-pay

## 2020-05-28 DIAGNOSIS — O209 Hemorrhage in early pregnancy, unspecified: Secondary | ICD-10-CM

## 2020-05-28 DIAGNOSIS — Z3A01 Less than 8 weeks gestation of pregnancy: Secondary | ICD-10-CM | POA: Insufficient documentation

## 2020-05-28 DIAGNOSIS — Z881 Allergy status to other antibiotic agents status: Secondary | ICD-10-CM | POA: Insufficient documentation

## 2020-05-28 DIAGNOSIS — N898 Other specified noninflammatory disorders of vagina: Secondary | ICD-10-CM

## 2020-05-28 DIAGNOSIS — O26891 Other specified pregnancy related conditions, first trimester: Secondary | ICD-10-CM

## 2020-05-28 DIAGNOSIS — O3680X0 Pregnancy with inconclusive fetal viability, not applicable or unspecified: Secondary | ICD-10-CM

## 2020-05-28 DIAGNOSIS — R102 Pelvic and perineal pain: Secondary | ICD-10-CM | POA: Insufficient documentation

## 2020-05-28 DIAGNOSIS — O26899 Other specified pregnancy related conditions, unspecified trimester: Secondary | ICD-10-CM

## 2020-05-28 DIAGNOSIS — Z880 Allergy status to penicillin: Secondary | ICD-10-CM | POA: Insufficient documentation

## 2020-05-28 DIAGNOSIS — O26851 Spotting complicating pregnancy, first trimester: Secondary | ICD-10-CM | POA: Insufficient documentation

## 2020-05-28 LAB — COMPREHENSIVE METABOLIC PANEL
ALT: 13 U/L (ref 0–44)
AST: 16 U/L (ref 15–41)
Albumin: 3.4 g/dL — ABNORMAL LOW (ref 3.5–5.0)
Alkaline Phosphatase: 83 U/L (ref 38–126)
Anion gap: 8 (ref 5–15)
BUN: 10 mg/dL (ref 6–20)
CO2: 21 mmol/L — ABNORMAL LOW (ref 22–32)
Calcium: 9.8 mg/dL (ref 8.9–10.3)
Chloride: 106 mmol/L (ref 98–111)
Creatinine, Ser: 0.7 mg/dL (ref 0.44–1.00)
GFR, Estimated: >60 mL/min (ref >60)
Glucose, Bld: 82 mg/dL (ref 70–99)
Potassium: 3.8 mmol/L (ref 3.5–5.1)
Sodium: 135 mmol/L (ref 135–145)
Total Bilirubin: 0.5 mg/dL (ref 0.3–1.2)
Total Protein: 6.7 g/dL (ref 6.5–8.1)

## 2020-05-28 LAB — CBC
HCT: 42 % (ref 36.0–46.0)
Hemoglobin: 13.7 g/dL (ref 12.0–15.0)
MCH: 28.9 pg (ref 26.0–34.0)
MCHC: 32.6 g/dL (ref 30.0–36.0)
MCV: 88.6 fL (ref 80.0–100.0)
Platelets: 341 10*3/uL (ref 150–400)
RBC: 4.74 MIL/uL (ref 3.87–5.11)
RDW: 13.7 % (ref 11.5–15.5)
WBC: 16 10*3/uL — ABNORMAL HIGH (ref 4.0–10.5)
nRBC: 0 % (ref 0.0–0.2)

## 2020-05-28 LAB — URINALYSIS, ROUTINE W REFLEX MICROSCOPIC
Bilirubin Urine: NEGATIVE
Glucose, UA: NEGATIVE mg/dL
Ketones, ur: NEGATIVE mg/dL
Nitrite: NEGATIVE
Protein, ur: NEGATIVE mg/dL
Specific Gravity, Urine: 1.027 (ref 1.005–1.030)
pH: 6 (ref 5.0–8.0)

## 2020-05-28 LAB — HIV ANTIBODY (ROUTINE TESTING W REFLEX): HIV Screen 4th Generation wRfx: NONREACTIVE

## 2020-05-28 LAB — WET PREP, GENITAL
Clue Cells Wet Prep HPF POC: NONE SEEN
Sperm: NONE SEEN
Trich, Wet Prep: NONE SEEN
Yeast Wet Prep HPF POC: NONE SEEN

## 2020-05-28 LAB — POCT PREGNANCY, URINE: Preg Test, Ur: POSITIVE — AB

## 2020-05-28 LAB — HCG, QUANTITATIVE, PREGNANCY: hCG, Beta Chain, Quant, S: 4340 m[IU]/mL — ABNORMAL HIGH (ref <5)

## 2020-05-28 MED ORDER — METRONIDAZOLE 500 MG PO TABS: 500.0000 mg | ORAL_TABLET | Freq: Two times a day (BID) | ORAL | 0 refills | Status: AC

## 2020-05-28 MED ORDER — PRENATAL 27-0.8 MG PO TABS: 1.0000 | ORAL_TABLET | Freq: Every day | ORAL | 0 refills | Status: DC

## 2020-05-28 NOTE — MAU Provider Note (Signed)
Chief Complaint: Abdominal Pain   Event Date/Time   First Provider Initiated Contact with Patient 05/28/20 2054        SUBJECTIVE HPI: Anita Allen is a 30 y.o. [id number] at [redacted]w[redacted]d by LMP who presents to maternity admissions reporting spotting a few days ago, pelvic cramps, and vaginal discharge with odor.  She denies vaginal itching/burning, urinary symptoms, h/a, dizziness, n/v, or fever/chills.    Abdominal Pain This is a new problem. The current episode started 1 to 4 weeks ago. The onset quality is gradual. The problem occurs intermittently. The problem has been unchanged. The pain is located in the RLQ, LLQ and suprapubic region. The quality of the pain is cramping. The abdominal pain does not radiate. Pertinent negatives include no diarrhea, dysuria, fever, frequency, myalgias, nausea or vomiting. Nothing aggravates the pain. The pain is relieved by nothing. She has tried nothing for the symptoms.   RN Note: Pt reports she had a positive home pregnancy test 4 days ago.  Pt reports seeing dark red blood when wiping a couple of times a few days ago.  Pt reports abnormal discharge with a foul odor.  Pt reports abdominal stomach cramps for two weeks.   Past Medical History:  Diagnosis Date   BV (bacterial vaginosis)    Gonorrhea contact, treated    H/O dilation and curettage    Hx of trichomoniasis    Molar pregnancy 12-2013   PCOS (polycystic ovarian syndrome)    Vaginal delivery 2012, 2013   Yeast infection    Past Surgical History:  Procedure Laterality Date   brazillian butt lift     DILATION AND CURETTAGE OF UTERUS     DILATION AND EVACUATION N/A 12/20/2013   Procedure: DILATATION AND EVACUATION;  Surgeon: Adam Phenix, MD;  Location: [facility];  Service: Gynecology;  Laterality: N/A;   LIPOSUCTION TRUNK     Social History   Socioeconomic History   Marital status: Married    Spouse name: Not on file   Number of children: Not on file   Years of  education: Not on file   Highest education level: Not on file  Occupational History   Not on file  Tobacco Use   Smoking status: Never Smoker   Smokeless tobacco: Never Used  Vaping Use   Vaping Use: Never used  Substance and Sexual Activity   Alcohol use: No   Drug use: No   Sexual activity: Yes    Birth control/protection: Condom  Other Topics Concern   Not on file  Social History Narrative   ** Merged History Encounter **       Social Determinants of Health   Financial Resource Strain: Not on file  Food Insecurity: Not on file  Transportation Needs: Not on file  Physical Activity: Not on file  Stress: Not on file  Social Connections: Not on file  Intimate Partner Violence: Not on file   No current facility-administered medications on file prior to encounter.   Current Outpatient Medications on File Prior to Encounter  Medication Sig Dispense Refill   benzonatate (TESSALON) 100 MG capsule Take 1 capsule (100 mg total) by mouth 3 (three) times daily as needed for cough. 30 capsule 0   fluticasone (FLONASE) 50 MCG/ACT nasal spray Place 1 spray into both nostrils daily. 16 g 1   loratadine (CLARITIN) 10 MG tablet Take 1 tablet (10 mg total) by mouth daily. 30 tablet 1   [DISCONTINUED] phentermine (ADIPEX-P) 37.5 MG tablet Take 37.5 mg  by mouth daily.     Allergies  Allergen Reactions   Augmentin [Amoxicillin-Pot Clavulanate] Hives and Other (See Comments)    Pt has taken Ceftriaxone in the past (??)   Latex Rash    Vaginal irritation    I have reviewed patient's Past Medical Hx, Surgical Hx, Family Hx, Social Hx, medications and allergies.   ROS:  Review of Systems  Constitutional: Negative for fever.  Gastrointestinal: Positive for abdominal pain. Negative for diarrhea, nausea and vomiting.  Genitourinary: Negative for dysuria and frequency.  Musculoskeletal: Negative for myalgias.   Review of Systems  Other systems negative   Physical Exam   Physical Exam Patient Vitals for the past 24 hrs:  BP Temp Temp src Pulse Resp SpO2 Weight  05/28/20 2009 -- -- -- -- -- -- 86 kg  05/28/20 2003 120/70 98.8 F (37.1 C) Oral 80 12 99 % --   Constitutional: Well-developed, well-nourished female in no acute distress.  Cardiovascular: normal rate Respiratory: normal effort GI: Abd soft, non-tender.  MS: Extremities nontender, no edema, normal ROM Neurologic: Alert and oriented x 4.  GU: Neg CVAT.  PELVIC EXAM: Cervix pink, visually closed, without lesion, scant white creamy discharge, vaginal walls and external genitalia normal   LAB RESULTS Results for orders placed or performed during the hospital encounter of 05/28/20 (from the past 24 hour(s))  Pregnancy, urine POC     Status: Abnormal   Collection Time: 05/28/20  8:03 PM  Result Value Ref Range   Preg Test, Ur POSITIVE (A) NEGATIVE  Urinalysis, Routine w reflex microscopic Urine, Clean Catch     Status: Abnormal   Collection Time: 05/28/20  8:08 PM  Result Value Ref Range   Color, Urine YELLOW YELLOW   APPearance HAZY (A) CLEAR   Specific Gravity, Urine 1.027 1.005 - 1.030   pH 6.0 5.0 - 8.0   Glucose, UA NEGATIVE NEGATIVE mg/dL   Hgb urine dipstick SMALL (A) NEGATIVE   Bilirubin Urine NEGATIVE NEGATIVE   Ketones, ur NEGATIVE NEGATIVE mg/dL   Protein, ur NEGATIVE NEGATIVE mg/dL   Nitrite NEGATIVE NEGATIVE   Leukocytes,Ua LARGE (A) NEGATIVE   RBC / HPF 6-10 0 - 5 RBC/hpf   WBC, UA 6-10 0 - 5 WBC/hpf   Bacteria, UA RARE (A) NONE SEEN   Squamous Epithelial / LPF 11-20 0 - 5   Mucus PRESENT   Wet prep, genital     Status: Abnormal   Collection Time: 05/28/20  8:27 PM   Specimen: Vaginal  Result Value Ref Range   Yeast Wet Prep HPF POC NONE SEEN NONE SEEN   Trich, Wet Prep NONE SEEN NONE SEEN   Clue Cells Wet Prep HPF POC NONE SEEN NONE SEEN   WBC, Wet Prep HPF POC MANY (A) NONE SEEN   Sperm NONE SEEN   CBC     Status: Abnormal   Collection Time: 05/28/20  8:31 PM   Result Value Ref Range   WBC 16.0 (H) 4.0 - 10.5 K/uL   RBC 4.74 3.87 - 5.11 MIL/uL   Hemoglobin 13.7 12.0 - 15.0 g/dL   HCT 63.8 75.6 - 43.3 %   MCV 88.6 80.0 - 100.0 fL   MCH 28.9 26.0 - 34.0 pg   MCHC 32.6 30.0 - 36.0 g/dL   RDW 29.5 18.8 - 41.6 %   Platelets 341 150 - 400 K/uL   nRBC 0.0 0.0 - 0.2 %  hCG, quantitative, pregnancy     Status: Abnormal   Collection  Time: 05/28/20  8:31 PM  Result Value Ref Range   hCG, Beta Chain, Quant, S 4,340 (H) <5 mIU/mL  HIV Antibody (routine testing w rflx)     Status: None   Collection Time: 05/28/20  8:31 PM  Result Value Ref Range   HIV Screen 4th Generation wRfx Non Reactive Non Reactive  Comprehensive metabolic panel     Status: Abnormal   Collection Time: 05/28/20  8:31 PM  Result Value Ref Range   Sodium 135 135 - 145 mmol/L   Potassium 3.8 3.5 - 5.1 mmol/L   Chloride 106 98 - 111 mmol/L   CO2 21 (L) 22 - 32 mmol/L   Glucose, Bld 82 70 - 99 mg/dL   BUN 10 6 - 20 mg/dL   Creatinine, Ser 2.22 0.44 - 1.00 mg/dL   Calcium 9.8 8.9 - 97.9 mg/dL   Total Protein 6.7 6.5 - 8.1 g/dL   Albumin 3.4 (L) 3.5 - 5.0 g/dL   AST 16 15 - 41 U/L   ALT 13 0 - 44 U/L   Alkaline Phosphatase 83 38 - 126 U/L   Total Bilirubin 0.5 0.3 - 1.2 mg/dL   GFR, Estimated >89 >21 mL/min   Anion gap 8 5 - 15    IMAGING US OB Comp Less 14 Wks  Result Date: 05/28/2020 CLINICAL DATA:  Vaginal bleeding and cramps. EXAM: OBSTETRIC <14 WK ULTRASOUND TECHNIQUE: Transabdominal ultrasound was performed for evaluation of the gestation as well as the maternal uterus and adnexal regions. COMPARISON:  None. FINDINGS: Intrauterine gestational sac: Single Yolk sac:  Visualized. Embryo:  Not Visualized. Cardiac Activity: Not Visualized. Heart Rate: N/A bpm MSD:  6.9 mm   5 w   2 d Subchorionic hemorrhage:  None visualized. Maternal uterus/adnexae: A small corpus luteum cyst is seen within an otherwise normal appearing right ovary. The left ovary is visualized and is normal  in appearance. No pelvic free fluid is identified. IMPRESSION: Single intrauterine gestational sac and yolk sac, at approximately 5 weeks and 2 days gestation by ultrasound evaluation, without evidence of a fetal pole. While this may be secondary to early intrauterine pregnancy, correlation with follow-up pelvic ultrasound is recommended. Electronically Signed   By: Aram Candela M.D.   On: 05/28/2020 21:04   US OB Transvaginal  Result Date: 05/28/2020 CLINICAL DATA:  Vaginal bleeding and cramps. EXAM: OBSTETRIC <14 WK [place] AND TRANSVAGINAL OB US TECHNIQUE: Both transabdominal and transvaginal ultrasound examinations were performed for complete evaluation of the gestation as well as the maternal uterus, adnexal regions, and pelvic cul-de-sac. Transvaginal technique was performed to assess early pregnancy. COMPARISON:  None. FINDINGS: Intrauterine gestational sac: Single Yolk sac:  Visualized. Embryo:  Not Visualized. Cardiac Activity: Not Visualized. Heart Rate: N/A bpm MSD: 6.9 mm   5 w   2 d Subchorionic hemorrhage:  None visualized. Maternal uterus/adnexae: A small corpus luteum cyst is seen within an otherwise normal appearing right ovary. The left ovary is visualized and is normal in appearance. No pelvic free fluid is identified. IMPRESSION: Single intrauterine gestational sac and yolk sac, at approximately 5 weeks and 2 days gestation by ultrasound evaluation, without evidence of a fetal pole. While this may be secondary to early intrauterine pregnancy, correlation with follow-up pelvic ultrasound is recommended. Electronically Signed   By: Aram Candela M.D.   On: 05/28/2020 21:06    MAU Management/MDM: Ordered usual first trimester r/o ectopic labs.   Pelvic exam and cultures done Will check baseline Ultrasound to rule  out ectopic.  This bleeding/pain can represent a normal pregnancy with bleeding, spontaneous abortion or even an ectopic which can be life-threatening.  The process as  listed above helps to determine which of these is present.  Reviewed findings with patient.  Recommend followup for viability with new OB but no further imaging necessary to rule out ectopic  ASSESSMENT Pregnancy at 6425w1d Pelvic cramping  Spotting in early pregnancy Pregnancy of unknown location >> now noted to be IUP GS+YS Vaginal discharge  PLAN Discharge home Reviewed discharge in pregnancy can be normal, no clue cells seen.  Patient wants to be treated due to the odor she has.  Rx Flagyl sent.  Followup with prenatal care  Pt stable at time of discharge. Encouraged to return here if she develops worsening of symptoms, increase in pain, fever, or other concerning symptoms.    Wynelle BourgeoisMarie Loriana Samad CNM, MSN Certified Nurse-Midwife 05/28/2020  9:22 PM

## 2020-05-28 NOTE — MAU Note (Signed)
Pt reports she had a positive home pregnancy test 4 days ago.   Pt reports seeing dark red blood when wiping a couple of times a few days ago.   Pt reports abnormal discharge with a foul odor.   Pt reports abdominal stomach cramps for two weeks.

## 2020-05-28 NOTE — Discharge Instructions (Signed)
Vaginal Bleeding During Pregnancy, First Trimester A small amount of bleeding from the vagina is common during early pregnancy. This kind of bleeding is also called spotting. Sometimes the bleeding is normal and does not cause problems. At other times, though, bleeding may be a sign of something serious. Normal bleeding in pregnancy can happen:  When the fertilized egg attaches itself to your womb.  When blood vessels change because of the pregnancy.  When you have pelvic exams.  When you have sex. Abnormal bleeding can happen:  When you have an infection.  When you have growths in your womb. The growths are called polyps.  If you are having a miscarriage or at risk of having one.  If you have other problems in your pregnancy. Tell your doctor right away about any bleeding from your vagina. Follow these instructions at home: Watch your bleeding  Watch your condition for any changes. Let your doctor know if you are worried about something.  Try to know what causes your bleeding. Ask yourself these questions: ? Does the bleeding start on its own? ? Does the bleeding start after something is done, such as sex or a pelvic exam?  Use a diary to write the things you see about your bleeding. Write in your diary: ? If the bleeding flows freely without stopping, or if it starts and stops, and then starts again. ? If the bleeding is heavy or light. ? How many pads you use in a day and how much blood is in them.  Tell your doctor if you pass tissue. He or she may want to see it.   Activity  Follow your doctor's instructions about how active you can be. Ask what activities are safe for you.  Do not have sex or orgasms until your doctor says that this is safe.  If needed, make plans for someone to help with your normal activities. General instructions  Take over-the-counter and prescription medicines only as told by your doctor.  Do not take aspirin because it can cause  bleeding.  Do not use tampons.  Do not douche.  Keep all follow-up visits. Contact a doctor if:  You have vaginal bleeding at any time while you are pregnant.  You have cramps.  You have a fever or chills. Get help right away if:  You have very bad cramps in your back or belly (abdomen).  You pass large clots or a lot of tissue from your vagina.  Your bleeding gets worse.  You feel light-headed.  You feel weak.  You pass out (faint).  You have chills.  You are leaking fluid from your vagina.  You have a gush of fluid from your vagina. Summary  Sometimes vaginal bleeding during pregnancy is normal and does not cause problems. At other times, bleeding may be a sign of something serious.  Tell your doctor right away about any bleeding from your vagina.  Follow your doctor's instructions about how active you can be. You may need someone to help you with your normal activities.  Keep all follow-up visits. This information is not intended to replace advice given to you by your health care provider. Make sure you discuss any questions you have with your health care provider. Document Revised: 01/18/2020 Document Reviewed: 01/18/2020 Elsevier Patient Education  2021 Elsevier Inc.  Bacterial Vaginosis  Bacterial vaginosis is an infection that occurs when the normal balance of bacteria in the vagina changes. This change is caused by an overgrowth of certain bacteria in  the vagina. Bacterial vaginosis is the most common vaginal infection among females aged 66 to 62 years. This condition increases the risk of sexually transmitted infections (STIs). Treatment can help reduce this risk. Treatment is very important for pregnant women because this condition can cause babies to be born early (prematurely) or at a low birth weight. What are the causes? This condition is caused by an increase in harmful bacteria that are normally present in small amounts in the vagina. However, the  exact reason this condition develops is not known. You cannot get bacterial vaginosis from toilet seats, bedding, swimming pools, or contact with objects around you. What increases the risk? The following factors may make you more likely to develop this condition:  Having a new sexual partner or multiple sexual partners, or having unprotected sex.  Douching.  Having an intrauterine device (IUD).  Smoking.  Abusing drugs and alcohol. This may lead to riskier sexual behavior.  Taking certain antibiotic medicines.  Being pregnant. What are the signs or symptoms? Some women with this condition have no symptoms. Symptoms may include:  Wallace Cullens or white vaginal discharge. The discharge can be watery or foamy.  A fish-like odor with discharge, especially after sex or during menstruation.  Itching in and around the vagina.  Burning or pain with urination. How is this diagnosed? This condition is diagnosed based on:  Your medical history.  A physical exam of the vagina.  Checking a sample of vaginal fluid for harmful bacteria or abnormal cells. How is this treated? This condition is treated with antibiotic medicines. These may be given as a pill, a vaginal cream, or a medicine that is put into the vagina (suppository). If the condition comes back after treatment, a second round of antibiotics may be needed. Follow these instructions at home: Medicines  Take or apply over-the-counter and prescription medicines only as told by your health care provider.  Take or apply your antibiotic medicine as told by your health care provider. Do not stop using the antibiotic even if you start to feel better. General instructions  If you have a female sexual partner, tell her that you have a vaginal infection. She should follow up with her health care provider. If you have a female sexual partner, he does not need treatment.  Avoid sexual activity until you finish treatment.  Drink enough fluid to  keep your urine pale yellow.  Keep the area around your vagina and rectum clean. ? Wash the area daily with warm water. ? Wipe yourself from front to back after using the toilet.  If you are breastfeeding, talk to your health care provider about continuing breastfeeding during treatment.  Keep all follow-up visits. This is important. How is this prevented? Self-care  Do not douche.  Wash the outside of your vagina with warm water only.  Wear cotton or cotton-lined underwear.  Avoid wearing tight pants and pantyhose, especially during the summer. Safe sex  Use protection when having sex. This includes: ? Using condoms. ? Using dental dams. This is a thin layer of a material made of latex or polyurethane that protects the mouth during oral sex.  Limit the number of sexual partners. To help prevent bacterial vaginosis, it is best to have sex with just one partner (monogamous relationship).  Make sure you and your sexual partner are tested for STIs. Drugs and alcohol  Do not use any products that contain nicotine or tobacco. These products include cigarettes, chewing tobacco, and vaping devices, such as e-cigarettes.  If you need help quitting, ask your health care provider.  Do not use drugs.  Do not drink alcohol if: ? Your health care provider tells you not to do this. ? You are pregnant, may be pregnant, or are planning to become pregnant.  If you drink alcohol: ? Limit how much you have to 0-1 drink a day. ? Be aware of how much alcohol is in your drink. In the [place] one drink equals one 12 oz bottle of beer (355 mL), one 5 oz glass of wine (148 mL), or one 1 oz glass of hard liquor (44 mL). Where to find more information  Centers for Disease Control and Prevention: FootballExhibition.com.br  American Sexual Health Association (ASHA): www.ashastd.org  [place] Department of Health and Health and safety inspector, Office on Women's Health: http://hoffman.com/ Contact a health care provider  if:  Your symptoms do not improve, even after treatment.  You have more discharge or pain when urinating.  You have a fever or chills.  You have pain in your abdomen or pelvis.  You have pain during sex.  You have vaginal bleeding between menstrual periods. Summary  Bacterial vaginosis is a vaginal infection that occurs when the normal balance of bacteria in the vagina changes. It results from an overgrowth of certain bacteria.  This condition increases the risk of sexually transmitted infections (STIs). Getting treated can help reduce this risk.  Treatment is very important for pregnant women because this condition can cause babies to be born early (prematurely) or at low birth weight.  This condition is treated with antibiotic medicines. These may be given as a pill, a vaginal cream, or a medicine that is put into the vagina (suppository). This information is not intended to replace advice given to you by your health care provider. Make sure you discuss any questions you have with your health care provider. Document Revised: 10/26/2019 Document Reviewed: 10/26/2019 Elsevier Patient Education  2021 [employer].

## 2020-05-29 LAB — GC/CHLAMYDIA PROBE AMP (~~LOC~~) NOT AT ARMC
Chlamydia: NEGATIVE
Comment: NEGATIVE
Comment: NORMAL
Neisseria Gonorrhea: NEGATIVE

## 2020-07-29 ENCOUNTER — Encounter (HOSPITAL_COMMUNITY): Payer: Self-pay | Admitting: Obstetrics and Gynecology

## 2020-07-29 ENCOUNTER — Inpatient Hospital Stay (HOSPITAL_COMMUNITY): Payer: Self-pay

## 2020-07-29 ENCOUNTER — Other Ambulatory Visit: Payer: Self-pay

## 2020-07-29 ENCOUNTER — Inpatient Hospital Stay (HOSPITAL_COMMUNITY)
Admission: AD | Admit: 2020-07-29 | Discharge: 2020-07-29 | Disposition: A | Discharge status: Home / Self Care | Payer: Self-pay | Attending: Obstetrics and Gynecology | Admitting: Obstetrics and Gynecology

## 2020-07-29 DIAGNOSIS — O4690 Antepartum hemorrhage, unspecified, unspecified trimester: Secondary | ICD-10-CM | POA: Insufficient documentation

## 2020-07-29 DIAGNOSIS — Z3A00 Weeks of gestation of pregnancy not specified: Secondary | ICD-10-CM | POA: Insufficient documentation

## 2020-07-29 DIAGNOSIS — Z3689 Encounter for other specified antenatal screening: Secondary | ICD-10-CM | POA: Insufficient documentation

## 2020-07-29 DIAGNOSIS — N939 Abnormal uterine and vaginal bleeding, unspecified: Secondary | ICD-10-CM

## 2020-07-29 DIAGNOSIS — O3680X0 Pregnancy with inconclusive fetal viability, not applicable or unspecified: Secondary | ICD-10-CM | POA: Insufficient documentation

## 2020-07-29 DIAGNOSIS — Z6790 Unspecified blood type, Rh positive: Secondary | ICD-10-CM | POA: Insufficient documentation

## 2020-07-29 LAB — CBC
HCT: 37 % (ref 36.0–46.0)
Hemoglobin: 11.8 g/dL — ABNORMAL LOW (ref 12.0–15.0)
MCH: 28.9 pg (ref 26.0–34.0)
MCHC: 31.9 g/dL (ref 30.0–36.0)
MCV: 90.5 fL (ref 80.0–100.0)
Platelets: 372 10*3/uL (ref 150–400)
RBC: 4.09 MIL/uL (ref 3.87–5.11)
RDW: 13.1 % (ref 11.5–15.5)
WBC: 11.5 10*3/uL — ABNORMAL HIGH (ref 4.0–10.5)
nRBC: 0 % (ref 0.0–0.2)

## 2020-07-29 LAB — POCT PREGNANCY, URINE: Preg Test, Ur: NEGATIVE

## 2020-07-29 LAB — HCG, QUANTITATIVE, PREGNANCY: hCG, Beta Chain, Quant, S: 55 m[IU]/mL — ABNORMAL HIGH (ref <5)

## 2020-07-29 NOTE — MAU Note (Addendum)
Presents with c/o heavy VB, reports saturated sanitary one every 30 minutes, passing clots. Denies abdominal pain/cramping. S/P abortion 06/21/2020 and had 2 positive HPT.

## 2020-07-29 NOTE — Discharge Instructions (Signed)
Vaginal Bleeding During Pregnancy, First Trimester A small amount of bleeding from the vagina is common during early pregnancy. This kind of bleeding is also called spotting. Sometimes the bleeding is normal and does not cause problems. At other times, though, bleeding may be a sign of something serious. Normal bleeding in pregnancy can happen:  When the fertilized egg attaches itself to your womb.  When blood vessels change because of the pregnancy.  When you have pelvic exams.  When you have sex. Abnormal bleeding can happen:  When you have an infection.  When you have growths in your womb. The growths are called polyps.  If you are having a miscarriage or at risk of having one.  If you have other problems in your pregnancy. Tell your doctor right away about any bleeding from your vagina. Follow these instructions at home: Watch your bleeding  Watch your condition for any changes. Let your doctor know if you are worried about something.  Try to know what causes your bleeding. Ask yourself these questions: ? Does the bleeding start on its own? ? Does the bleeding start after something is done, such as sex or a pelvic exam?  Use a diary to write the things you see about your bleeding. Write in your diary: ? If the bleeding flows freely without stopping, or if it starts and stops, and then starts again. ? If the bleeding is heavy or light. ? How many pads you use in a day and how much blood is in them.  Tell your doctor if you pass tissue. He or she may want to see it.   Activity  Follow your doctor's instructions about how active you can be. Ask what activities are safe for you.  Do not have sex or orgasms until your doctor says that this is safe.  If needed, make plans for someone to help with your normal activities. General instructions  Take over-the-counter and prescription medicines only as told by your doctor.  Do not take aspirin because it can cause  bleeding.  Do not use tampons.  Do not douche.  Keep all follow-up visits. Contact a doctor if:  You have vaginal bleeding at any time while you are pregnant.  You have cramps.  You have a fever or chills. Get help right away if:  You have very bad cramps in your back or belly (abdomen).  You pass large clots or a lot of tissue from your vagina.  Your bleeding gets worse.  You feel light-headed.  You feel weak.  You pass out (faint).  You have chills.  You are leaking fluid from your vagina.  You have a gush of fluid from your vagina. Summary  Sometimes vaginal bleeding during pregnancy is normal and does not cause problems. At other times, bleeding may be a sign of something serious.  Tell your doctor right away about any bleeding from your vagina.  Follow your doctor's instructions about how active you can be. You may need someone to help you with your normal activities.  Keep all follow-up visits. This information is not intended to replace advice given to you by your health care provider. Make sure you discuss any questions you have with your health care provider. Document Revised: 01/18/2020 Document Reviewed: 01/18/2020 Elsevier Patient Education  2021 Elsevier Inc.  

## 2020-07-29 NOTE — MAU Provider Note (Signed)
History     CSN: [id number]  Arrival date and time: 07/29/20 1842   Event Date/Time   First Provider Initiated Contact with Patient 07/29/20 2205      Chief Complaint  Patient presents with  . Vaginal Bleeding   30 y.o. [id number] s/p TAB on presenting with VB. Reports having medical TAB on 2/11 and has not stopped bleeding since. Reports bleeding slowed at one point but became heavier 2 days ago. Reports soaking pad q30 min and passing clots. Denies pain. Reports having a negative HPT about 1 month after TAB. She had unprotected IC since.   OB History    Gravida  7   Para  3   Term  3   Preterm  0   AB  3   Living  3     SAB  1   IAB  2   Ectopic  0   Multiple  0   Live Births  3           Past Medical History:  Diagnosis Date  . BV (bacterial vaginosis)   . Gonorrhea contact, treated   . H/O dilation and curettage   . Hx of trichomoniasis   . Molar pregnancy 12-2013  . PCOS (polycystic ovarian syndrome)   . Vaginal delivery 2012, 2013  . Yeast infection     Past Surgical History:  Procedure Laterality Date  . brazillian butt lift    . DILATION AND CURETTAGE OF UTERUS    . DILATION AND EVACUATION N/A 12/20/2013   Procedure: DILATATION AND EVACUATION;  Surgeon: Adam Phenix, MD;  Location: [facility];  Service: Gynecology;  Laterality: N/A;  . LIPOSUCTION TRUNK      Family History  Problem Relation Age of Onset  . Hypertension Mother   . Heart attack Mother   . Heart disease Father   . Hypertension Father   . Heart attack Father     Social History   Tobacco Use  . Smoking status: Never Smoker  . Smokeless tobacco: Never Used  Vaping Use  . Vaping Use: Never used  Substance Use Topics  . Alcohol use: No  . Drug use: No    Allergies:  Allergies  Allergen Reactions  . Augmentin [Amoxicillin-Pot Clavulanate] Hives and Other (See Comments)    Pt has taken Ceftriaxone in the past (??)  . Latex Rash    Vaginal irritation    No  medications prior to admission.    Review of Systems  Constitutional: Negative for chills and fever.  Gastrointestinal: Negative for abdominal pain.  Genitourinary: Positive for vaginal bleeding.   Physical Exam   Blood pressure 119/81, pulse 78, temperature 98.5 F (36.9 C), temperature source Oral, resp. rate 17, height 5\' 1"  (1.549 m), weight 86.2 kg, SpO2 99 %, unknown if currently breastfeeding.  Physical Exam Vitals and nursing note reviewed. Exam conducted with a chaperone present.  Constitutional:      General: She is not in acute distress.    Appearance: Normal appearance.  HENT:     Head: Normocephalic and atraumatic.  Cardiovascular:     Rate and Rhythm: Normal rate.  Pulmonary:     Effort: Pulmonary effort is normal. No respiratory distress.  Abdominal:     General: There is no distension.     Palpations: Abdomen is soft. There is no mass.     Tenderness: There is no abdominal tenderness. There is no guarding or rebound.     Hernia: No hernia is  present.  Genitourinary:    Comments: External: no lesions or erythema Vagina: rugated, pink, moist, small amt bloody discharge, cleared with 2 fox swabs Uterus: non enlarged, anteverted, non tender, no CMT Adnexae: no masses, no tenderness left, no tenderness right Cervix closed  Musculoskeletal:        General: Normal range of motion.     Cervical back: Normal range of motion.  Skin:    General: Skin is warm and dry.  Neurological:     General: No focal deficit present.     Mental Status: She is alert and oriented to person, place, and time.  Psychiatric:        Mood and Affect: Mood normal.        Behavior: Behavior normal.    Results for orders placed or performed during the hospital encounter of 07/29/20 (from the past 24 hour(s))  Pregnancy, urine POC     Status: None   Collection Time: 07/29/20  7:04 PM  Result Value Ref Range   Preg Test, Ur NEGATIVE NEGATIVE  hCG, quantitative, pregnancy     Status:  Abnormal   Collection Time: 07/29/20  7:26 PM  Result Value Ref Range   hCG, Beta Chain, Quant, S 55 (H) <5 mIU/mL  CBC     Status: Abnormal   Collection Time: 07/29/20  8:40 PM  Result Value Ref Range   WBC 11.5 (H) 4.0 - 10.5 K/uL   RBC 4.09 3.87 - 5.11 MIL/uL   Hemoglobin 11.8 (L) 12.0 - 15.0 g/dL   HCT 41.9 62.2 - 29.7 %   MCV 90.5 80.0 - 100.0 fL   MCH 28.9 26.0 - 34.0 pg   MCHC 31.9 30.0 - 36.0 g/dL   RDW 98.9 21.1 - 94.1 %   Platelets 372 150 - 400 K/uL   nRBC 0.0 0.0 - 0.2 %   US OB Transvaginal  Result Date: 07/29/2020 CLINICAL DATA:  Vaginal bleeding and cramping, TAB 06/21/2020 EXAM: OBSTETRIC <14 WK [place] AND TRANSVAGINAL OB US TECHNIQUE: Both transabdominal and transvaginal ultrasound examinations were performed for complete evaluation of the gestation as well as the maternal uterus, adnexal regions, and pelvic cul-de-sac. Transvaginal technique was performed to assess early pregnancy. COMPARISON:  None. FINDINGS: Intrauterine gestational sac: None Subchorionic hemorrhage:  None visualized. Maternal uterus/adnexae: There is thickened irregular endometrium measuring 37 mm. Increased vascular flow seen within the heterogeneous endometrium. The ovaries are normal in appearance. IMPRESSION: Findings which may be suggestive of retained products conception. Electronically Signed   By: Jonna Clark M.D.   On: 07/29/2020 21:28   MAU Course  Procedures  MDM Labs and [place] ordered and reviewed. No IUP seen on [place] and thickened endometrium. With low qhcg it's unclear if this resembles retained POCs from recent TAB or new pregnancy and cannot r/o ectopic, early or failed. Consult with Dr. Donavan Foil, plan for qhcg f/u after 24 hrs. Discussed findings and plan with pt. Stable for discharge home.   Assessment and Plan   1. Pregnancy, location unknown   2. Vaginal bleeding   3. Blood type, Rh positive    Discharge home Follow up at [facility] on 08/01/20 @0800  Pelvic rest SAB/ectopic/bleeding  precautions  Allergies as of 07/29/2020      Reactions   Augmentin [amoxicillin-pot Clavulanate] Hives, Other (See Comments)   Pt has taken Ceftriaxone in the past (??)   Latex Rash   Vaginal irritation      Medication List    STOP taking these medications  benzonatate 100 MG capsule Commonly known as: TESSALON     TAKE these medications   fluticasone 50 MCG/ACT nasal spray Commonly known as: FLONASE Place 1 spray into both nostrils daily.   loratadine 10 MG tablet Commonly known as: CLARITIN Take 1 tablet (10 mg total) by mouth daily.   multivitamin-prenatal 27-0.8 MG Tabs tablet Take 1 tablet by mouth daily at 12 noon.      Donette Larry , CNM 07/29/2020, 11:07 PM

## 2020-07-29 NOTE — MAU Note (Signed)
Pt reports she feels like she is having a miscarriage. Some cramping and having heavy bleeding. Reports she had a TAB on 06/21/2020, had positive preg test 2 days ago. Changing a pad q 30 minutes.

## 2020-08-01 ENCOUNTER — Other Ambulatory Visit: Payer: Self-pay

## 2020-08-01 ENCOUNTER — Ambulatory Visit: Payer: Medicaid Other

## 2020-08-01 DIAGNOSIS — O3680X0 Pregnancy with inconclusive fetal viability, not applicable or unspecified: Secondary | ICD-10-CM

## 2020-08-01 LAB — BETA HCG QUANT (REF LAB): hCG Quant: 23 m[IU]/mL

## 2020-08-01 NOTE — Progress Notes (Signed)
Patient presented to the office today for a stat(hcg) level. Patient does report some bleeding at this time. Labs ordered.   07/29/2020 HCG level (55)   08/01/2020 3:25pm  Contacted patient to inform her of beta(hcg) level today is 23. Advised patient we will continued to monitor her hormone level until it is 0. Patient voiced understanding and appointment has been schedule for 08/08/2020 lab visit only.

## 2020-08-07 ENCOUNTER — Encounter (HOSPITAL_COMMUNITY): Payer: Self-pay

## 2020-08-07 ENCOUNTER — Ambulatory Visit (HOSPITAL_COMMUNITY)
Admission: EM | Admit: 2020-08-07 | Discharge: 2020-08-07 | Disposition: A | Discharge status: Home / Self Care | Payer: Self-pay | Attending: Emergency Medicine | Admitting: Emergency Medicine

## 2020-08-07 ENCOUNTER — Other Ambulatory Visit: Payer: Self-pay

## 2020-08-07 DIAGNOSIS — O26899 Other specified pregnancy related conditions, unspecified trimester: Secondary | ICD-10-CM | POA: Insufficient documentation

## 2020-08-07 DIAGNOSIS — Z880 Allergy status to penicillin: Secondary | ICD-10-CM | POA: Insufficient documentation

## 2020-08-07 DIAGNOSIS — Z881 Allergy status to other antibiotic agents status: Secondary | ICD-10-CM | POA: Insufficient documentation

## 2020-08-07 DIAGNOSIS — R051 Acute cough: Secondary | ICD-10-CM | POA: Insufficient documentation

## 2020-08-07 DIAGNOSIS — J069 Acute upper respiratory infection, unspecified: Secondary | ICD-10-CM

## 2020-08-07 DIAGNOSIS — Z20822 Contact with and (suspected) exposure to covid-19: Secondary | ICD-10-CM | POA: Insufficient documentation

## 2020-08-07 DIAGNOSIS — O99519 Diseases of the respiratory system complicating pregnancy, unspecified trimester: Secondary | ICD-10-CM | POA: Insufficient documentation

## 2020-08-07 DIAGNOSIS — Z3A00 Weeks of gestation of pregnancy not specified: Secondary | ICD-10-CM | POA: Insufficient documentation

## 2020-08-07 LAB — SARS CORONAVIRUS 2 (TAT 6-24 HRS): SARS Coronavirus 2: NEGATIVE
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
## 2020-08-07 NOTE — ED Triage Notes (Signed)
Pt in with c/o cough and congestion that has been going on for 1 week   Pt has not had medication for sxs

## 2020-08-07 NOTE — Discharge Instructions (Signed)
Your COVID test is pending.  You should self quarantine until the test result is back.    Take Tylenol or ibuprofen as needed for fever or discomfort.  Rest and keep yourself hydrated.    Follow-up with your primary care provider if your symptoms are not improving.     

## 2020-08-08 ENCOUNTER — Other Ambulatory Visit: Payer: Medicaid Other

## 2020-08-08 ENCOUNTER — Encounter: Payer: Self-pay | Admitting: Obstetrics and Gynecology

## 2020-08-08 ENCOUNTER — Other Ambulatory Visit: Payer: Self-pay

## 2020-08-08 DIAGNOSIS — O3680X0 Pregnancy with inconclusive fetal viability, not applicable or unspecified: Secondary | ICD-10-CM

## 2020-08-09 LAB — BETA HCG QUANT (REF LAB): hCG Quant: 3 m[IU]/mL

## 2020-08-21 ENCOUNTER — Other Ambulatory Visit: Payer: Self-pay

## 2020-08-21 ENCOUNTER — Encounter (HOSPITAL_COMMUNITY): Payer: Self-pay | Admitting: Emergency Medicine

## 2020-08-21 ENCOUNTER — Ambulatory Visit (HOSPITAL_COMMUNITY)
Admission: EM | Admit: 2020-08-21 | Discharge: 2020-08-21 | Disposition: A | Discharge status: Home / Self Care | Payer: Medicaid Other

## 2020-08-21 DIAGNOSIS — N760 Acute vaginitis: Secondary | ICD-10-CM | POA: Insufficient documentation

## 2020-08-21 DIAGNOSIS — Z711 Person with feared health complaint in whom no diagnosis is made: Secondary | ICD-10-CM | POA: Insufficient documentation

## 2020-08-21 MED ORDER — FLUCONAZOLE 150 MG PO TABS: 150.0000 mg | ORAL_TABLET | ORAL | 0 refills | Status: DC

## 2020-08-21 MED ORDER — DOXYCYCLINE HYCLATE 100 MG PO TABS: 100.0000 mg | ORAL_TABLET | Freq: Two times a day (BID) | ORAL | 0 refills | Status: DC

## 2020-08-21 NOTE — ED Triage Notes (Signed)
Pt presents with vaginal discharge xs 1 week. States has hx of yeast infections from metronidazole.
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
## 2020-08-22 ENCOUNTER — Telehealth (HOSPITAL_COMMUNITY): Payer: Self-pay | Admitting: Emergency Medicine

## 2020-08-22 LAB — CERVICOVAGINAL ANCILLARY ONLY
Bacterial Vaginitis (gardnerella): POSITIVE — AB
Candida Glabrata: NEGATIVE
Candida Vaginitis: NEGATIVE
Chlamydia: NEGATIVE
Comment: NEGATIVE
Comment: NEGATIVE
Comment: NEGATIVE
Comment: NEGATIVE
Comment: NEGATIVE
Comment: NORMAL
Neisseria Gonorrhea: NEGATIVE
Trichomonas: NEGATIVE

## 2020-09-26 ENCOUNTER — Encounter (HOSPITAL_COMMUNITY): Payer: Self-pay

## 2020-09-26 ENCOUNTER — Ambulatory Visit (HOSPITAL_COMMUNITY)
Admission: EM | Admit: 2020-09-26 | Discharge: 2020-09-26 | Disposition: A | Discharge status: Home / Self Care | Payer: Medicaid Other | Attending: Internal Medicine | Admitting: Internal Medicine

## 2020-09-26 DIAGNOSIS — B9689 Other specified bacterial agents as the cause of diseases classified elsewhere: Secondary | ICD-10-CM

## 2020-09-26 DIAGNOSIS — N760 Acute vaginitis: Secondary | ICD-10-CM

## 2020-09-26 MED ORDER — BORIC ACID VAGINAL 600 MG VA SUPP: 1.0000 | Freq: Every evening | VAGINAL | 0 refills | Status: DC

## 2020-09-26 MED ORDER — BORIC ACID VAGINAL 600 MG VA SUPP: 1.0000 | Freq: Every evening | VAGINAL | 0 refills | Status: AC

## 2020-09-26 NOTE — Discharge Instructions (Addendum)
Please use medications as directed If you have worsening symptoms please return to urgent care to be evaluated. 

## 2020-09-26 NOTE — ED Triage Notes (Signed)
Pt in with c/o vaginal discharge after her menstrual cycle   Denies any abdominal or back pain
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
## 2020-11-04 ENCOUNTER — Ambulatory Visit (HOSPITAL_COMMUNITY)
Admission: EM | Admit: 2020-11-04 | Discharge: 2020-11-04 | Disposition: A | Discharge status: Home / Self Care | Payer: Self-pay | Attending: Emergency Medicine | Admitting: Emergency Medicine

## 2020-11-04 ENCOUNTER — Other Ambulatory Visit: Payer: Self-pay

## 2020-11-04 ENCOUNTER — Encounter (HOSPITAL_COMMUNITY): Payer: Self-pay

## 2020-11-04 DIAGNOSIS — Z202 Contact with and (suspected) exposure to infections with a predominantly sexual mode of transmission: Secondary | ICD-10-CM | POA: Insufficient documentation

## 2020-11-04 MED ORDER — CEFTRIAXONE SODIUM 500 MG IJ SOLR
500.0000 mg | Freq: Once | INTRAMUSCULAR | Status: AC
  Administered 2020-11-04: 19:00:00 500 mg via INTRAMUSCULAR

## 2020-11-04 MED ORDER — CEFTRIAXONE SODIUM 500 MG IJ SOLR
INTRAMUSCULAR | Status: AC
  Filled 2020-11-04: qty 500

## 2020-11-04 MED ORDER — METRONIDAZOLE 500 MG PO TABS
ORAL_TABLET | ORAL | Status: AC
  Filled 2020-11-04: qty 4

## 2020-11-04 MED ORDER — LIDOCAINE HCL (PF) 1 % IJ SOLN
INTRAMUSCULAR | Status: AC
  Filled 2020-11-04: qty 2

## 2020-11-04 MED ORDER — DOXYCYCLINE HYCLATE 100 MG PO CAPS: 100.0000 mg | ORAL_CAPSULE | Freq: Two times a day (BID) | ORAL | 0 refills | Status: AC

## 2020-11-04 MED ORDER — METRONIDAZOLE 500 MG PO TABS
2000.0000 mg | ORAL_TABLET | Freq: Once | ORAL | Status: AC
  Administered 2020-11-04: 19:00:00 2000 mg via ORAL

## 2020-11-04 NOTE — Discharge Instructions (Addendum)
Take Doxycyline twice daily for seven days.  Your vaginal swab results will post to MyChart
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
## 2020-11-04 NOTE — ED Triage Notes (Signed)
Pt presents for STD testing. She states she had a new partner and wants to rule out any STD. States she is only having vaginal discharge.

## 2020-11-05 ENCOUNTER — Telehealth (HOSPITAL_COMMUNITY): Payer: Self-pay | Admitting: Emergency Medicine

## 2020-11-05 LAB — CERVICOVAGINAL ANCILLARY ONLY
Bacterial Vaginitis (gardnerella): POSITIVE — AB
Candida Glabrata: NEGATIVE
Candida Vaginitis: NEGATIVE
Chlamydia: NEGATIVE
Comment: NEGATIVE
Comment: NEGATIVE
Comment: NEGATIVE
Comment: NEGATIVE
Comment: NEGATIVE
Comment: NORMAL
Neisseria Gonorrhea: NEGATIVE
Trichomonas: NEGATIVE

## 2020-11-05 MED ORDER — METRONIDAZOLE 0.75 % VA GEL: 1.0000 | Freq: Every day | VAGINAL | 0 refills | Status: AC

## 2021-01-15 ENCOUNTER — Other Ambulatory Visit: Payer: Self-pay

## 2021-01-15 ENCOUNTER — Ambulatory Visit (HOSPITAL_COMMUNITY)
Admission: EM | Admit: 2021-01-15 | Discharge: 2021-01-15 | Disposition: A | Discharge status: Home / Self Care | Payer: Self-pay | Attending: Physician Assistant | Admitting: Physician Assistant

## 2021-01-15 ENCOUNTER — Encounter (HOSPITAL_COMMUNITY): Payer: Self-pay | Admitting: Emergency Medicine

## 2021-01-15 DIAGNOSIS — R197 Diarrhea, unspecified: Secondary | ICD-10-CM | POA: Insufficient documentation

## 2021-01-15 DIAGNOSIS — Z113 Encounter for screening for infections with a predominantly sexual mode of transmission: Secondary | ICD-10-CM | POA: Insufficient documentation

## 2021-01-15 LAB — POC URINE PREG, ED: Preg Test, Ur: NEGATIVE

## 2021-01-15 NOTE — Discharge Instructions (Addendum)
Recommend bland diet, increased fluids. Encourage follow up with any further concerns or worsening symptoms.
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
## 2021-01-15 NOTE — ED Triage Notes (Signed)
Pt is present today with diarrhea, nausea, HA, and vaginal irritation. Pt states that sx started one week ago.

## 2021-01-16 LAB — CERVICOVAGINAL ANCILLARY ONLY
Bacterial Vaginitis (gardnerella): POSITIVE — AB
Candida Glabrata: NEGATIVE
Candida Vaginitis: NEGATIVE
Chlamydia: NEGATIVE
Comment: NEGATIVE
Comment: NEGATIVE
Comment: NEGATIVE
Comment: NEGATIVE
Comment: NEGATIVE
Comment: NORMAL
Neisseria Gonorrhea: NEGATIVE
Trichomonas: NEGATIVE

## 2021-01-17 ENCOUNTER — Telehealth (HOSPITAL_COMMUNITY): Payer: Self-pay

## 2021-01-17 MED ORDER — METRONIDAZOLE 500 MG PO TABS: 500.0000 mg | ORAL_TABLET | Freq: Two times a day (BID) | ORAL | 0 refills | Status: DC

## 2021-02-12 ENCOUNTER — Inpatient Hospital Stay (HOSPITAL_COMMUNITY)
Admission: AD | Admit: 2021-02-12 | Discharge: 2021-02-12 | Disposition: A | Discharge status: Home / Self Care | Payer: Self-pay | Attending: Obstetrics & Gynecology | Admitting: Obstetrics & Gynecology

## 2021-02-12 ENCOUNTER — Other Ambulatory Visit: Payer: Self-pay

## 2021-02-12 DIAGNOSIS — N926 Irregular menstruation, unspecified: Secondary | ICD-10-CM

## 2021-02-12 DIAGNOSIS — Z3202 Encounter for pregnancy test, result negative: Secondary | ICD-10-CM | POA: Insufficient documentation

## 2021-02-12 DIAGNOSIS — N912 Amenorrhea, unspecified: Secondary | ICD-10-CM | POA: Insufficient documentation

## 2021-02-12 LAB — POCT PREGNANCY, URINE: Preg Test, Ur: NEGATIVE

## 2021-02-12 NOTE — MAU Provider Note (Signed)
Event Date/Time   First Provider Initiated Contact with Patient 02/12/21 2310      S Ms. Anita Allen is a 30 y.o. [phone] patient who presents to MAU today with complaint of missing her period, feeling tired, weak and nauseated. Does not want to be pregnant.  Not using contraception because she has hirsutism and thinks it was made worse by contraception. .   O BP 121/82 (BP Location: Right Arm)   Pulse 77   Temp 98 F (36.7 C) (Oral)   Resp 16   SpO2 100%  Physical Exam Constitutional:      General: She is not in acute distress.    Appearance: She is not ill-appearing or toxic-appearing.  HENT:     Head: Normocephalic.  Cardiovascular:     Rate and Rhythm: Normal rate.  Pulmonary:     Effort: Pulmonary effort is normal.  Neurological:     Mental Status: She is alert.   Pregnancy test negative  A Medical screening exam complete   P Discharge from MAU in stable condition Discussed need to make appt at [facility] with MD to assess for PCOS and discussed possible treatment.  May want to look at [facility] or other meds.  Patient given the option of transfer to [facility] for further evaluation or seek care in outpatient facility of choice   Warning signs for worsening condition that would warrant emergency follow-up discussed Patient may return to MAU as needed   Aviva Signs, CNM 02/12/2021 11:10 PM

## 2021-02-12 NOTE — MAU Note (Signed)
..  Anita Allen is a 30 y.o. at Unknown here in MAU reporting: She has not had a positive pregnancy test at home but has been feeling tired, weak, nauseous and heartburn LMP: august 27th  Pain score: 0/10 Vitals:   02/12/21 2306  BP: 121/82  Pulse: 77  Resp: 16  Temp: 98 F (36.7 C)  SpO2: 100%

## 2021-04-10 ENCOUNTER — Other Ambulatory Visit: Payer: Self-pay

## 2021-04-10 ENCOUNTER — Emergency Department (HOSPITAL_BASED_OUTPATIENT_CLINIC_OR_DEPARTMENT_OTHER): Payer: Medicaid Other | Admitting: Radiology

## 2021-04-10 ENCOUNTER — Encounter (HOSPITAL_BASED_OUTPATIENT_CLINIC_OR_DEPARTMENT_OTHER): Payer: Self-pay | Admitting: Obstetrics and Gynecology

## 2021-04-10 DIAGNOSIS — M79603 Pain in arm, unspecified: Secondary | ICD-10-CM | POA: Insufficient documentation

## 2021-04-10 DIAGNOSIS — M542 Cervicalgia: Secondary | ICD-10-CM | POA: Insufficient documentation

## 2021-04-10 DIAGNOSIS — Z5321 Procedure and treatment not carried out due to patient leaving prior to being seen by health care provider: Secondary | ICD-10-CM | POA: Insufficient documentation

## 2021-04-10 DIAGNOSIS — R079 Chest pain, unspecified: Secondary | ICD-10-CM | POA: Insufficient documentation

## 2021-04-10 LAB — BASIC METABOLIC PANEL
Anion gap: 5 (ref 5–15)
BUN: 9 mg/dL (ref 6–20)
CO2: 25 mmol/L (ref 22–32)
Calcium: 10.7 mg/dL — ABNORMAL HIGH (ref 8.9–10.3)
Chloride: 106 mmol/L (ref 98–111)
Creatinine, Ser: 0.67 mg/dL (ref 0.44–1.00)
GFR, Estimated: >60 mL/min (ref >60)
Glucose, Bld: 89 mg/dL (ref 70–99)
Potassium: 4.2 mmol/L (ref 3.5–5.1)
Sodium: 136 mmol/L (ref 135–145)

## 2021-04-10 LAB — CBC
HCT: 40.5 % (ref 36.0–46.0)
Hemoglobin: 13 g/dL (ref 12.0–15.0)
MCH: 26.6 pg (ref 26.0–34.0)
MCHC: 32.1 g/dL (ref 30.0–36.0)
MCV: 82.8 fL (ref 80.0–100.0)
Platelets: 374 10*3/uL (ref 150–400)
RBC: 4.89 MIL/uL (ref 3.87–5.11)
RDW: 15.9 % — ABNORMAL HIGH (ref 11.5–15.5)
WBC: 8.2 10*3/uL (ref 4.0–10.5)
nRBC: 0 % (ref 0.0–0.2)

## 2021-04-10 LAB — TROPONIN I (HIGH SENSITIVITY): Troponin I (High Sensitivity): <2 ng/L (ref <18)

## 2021-04-10 NOTE — ED Triage Notes (Signed)
Patient reports to the ER for arm pain that shoots into her neck and into her back and chest. Patient reports no shortness's of breath. Patient also reports she has had x3 periods within the last month and is concerned about the amount of bleeding.

## 2021-04-11 ENCOUNTER — Emergency Department (HOSPITAL_BASED_OUTPATIENT_CLINIC_OR_DEPARTMENT_OTHER)
Admission: EM | Admit: 2021-04-11 | Discharge: 2021-04-11 | Disposition: A | Discharge status: Home / Self Care | Payer: Medicaid Other | Attending: Emergency Medicine | Admitting: Emergency Medicine

## 2021-04-11 DIAGNOSIS — Z5321 Procedure and treatment not carried out due to patient leaving prior to being seen by health care provider: Secondary | ICD-10-CM

## 2021-04-11 NOTE — ED Provider Notes (Signed)
It appears that patient left without being seen by a provider.    Marily Memos, MD 04/11/21 [phone]

## 2021-06-13 ENCOUNTER — Other Ambulatory Visit (HOSPITAL_COMMUNITY)
Admission: RE | Admit: 2021-06-13 | Discharge: 2021-06-13 | Disposition: A | Discharge status: Home / Self Care | Payer: No Typology Code available for payment source | Source: Ambulatory Visit | Attending: Obstetrics and Gynecology | Admitting: Obstetrics and Gynecology

## 2021-06-13 ENCOUNTER — Encounter: Payer: Self-pay | Admitting: Obstetrics and Gynecology

## 2021-06-13 ENCOUNTER — Other Ambulatory Visit: Payer: Self-pay

## 2021-06-13 ENCOUNTER — Ambulatory Visit (INDEPENDENT_AMBULATORY_CARE_PROVIDER_SITE_OTHER): Payer: No Typology Code available for payment source | Admitting: Obstetrics and Gynecology

## 2021-06-13 DIAGNOSIS — Z01419 Encounter for gynecological examination (general) (routine) without abnormal findings: Secondary | ICD-10-CM

## 2021-06-13 DIAGNOSIS — N939 Abnormal uterine and vaginal bleeding, unspecified: Secondary | ICD-10-CM

## 2021-06-13 DIAGNOSIS — Z202 Contact with and (suspected) exposure to infections with a predominantly sexual mode of transmission: Secondary | ICD-10-CM | POA: Insufficient documentation

## 2021-06-13 NOTE — Patient Instructions (Addendum)
Intrauterine Copper Contraceptive What is this medication? INTRAUTERINE COPPER (IN truh Marijean NiemannYOO tuh ryn KOP er) prevents pregnancy. It belongs to a group of medications known as contraceptives. This medicine may be used for other purposes; ask your health care provider or pharmacist if you have questions. COMMON BRAND NAME(S): ParaGard [id number] What should I tell my care team before I take this medication? They need to know if you have any of these conditions: Abnormal Pap smear Cancer of the breast, uterus, or cervix Endometritis Genital or pelvic infection now or in the past Have more than one sexual partner or your partner has more than one partner History of an ectopic or tubal pregnancy Immune system problems IUD in place Use intravenous drugs Uterus of unusual shape Vaginal bleeding that has not been explained Wilson's disease An unusual or allergic reaction to copper, barium sulfate, or polyethylene, other medications, foods, dyes, or preservatives Pregnant or trying to get pregnant Breast-feeding How should I use this medication? This device is placed inside the uterus by your care team. A patient package insert for the product will be given each time it is inserted. Be sure to read this information carefully each time. The sheet may change often. Talk to your care team about use of this medication in children. Special care may be needed. Overdosage: If you think you have taken too much of this medicine contact a poison control center or emergency room at once. NOTE: This medicine is only for you. Do not share this medicine with others. What if I miss a dose? This does not apply. The device will need to be replaced every 10 years if you wish to continue using this type of birth control. What may interact with this medication? Interactions are not expected. Tell your care team about all the medications you take. This list may not describe all possible interactions. Give your health  care provider a list of all the medicines, herbs, non-prescription drugs, or dietary supplements you use. Also tell them if you smoke, drink alcohol, or use illegal drugs. Some items may interact with your medicine. What should I watch for while using this medication? Visit your care team for regular check-ups. Tell your care team if you or your partner becomes HIV positive or gets a sexually transmitted disease. If you have any reason to think you are pregnant, stop using this medication right away and contact your care team. This medication does not protect you against HIV infection (AIDS) or any other sexually transmitted diseases. You can check the placement of the IUD yourself by reaching up to the top of your vagina with clean fingers to feel the threads. Do not pull on the threads. It is a good habit to check placement after each menstrual period. Call your care team right away if you feel more of the IUD than just the threads or if you cannot feel the threads at all. The IUD may come out by itself. You may become pregnant if the device comes out. If you notice that the IUD has come out use a backup birth control method like condoms and call your care team. Using tampons will not change the position of the IUD and are okay to use during your period. This IUD can be safely scanned with magnetic resonance imaging (MRI) only under specific conditions. Before you have an MRI, tell your care team that you have an IUD in place, and which type of IUD you have in place. What side effects may I  notice from receiving this medication? Side effects that you should report to your care team as soon as possible: Allergic reactions--skin rash, itching, hives, swelling of the face, lips, tongue, or throat Heavy vaginal bleeding Low red blood cell counts--trouble breathing; feeling faint; lightheaded, falls; unusually weak or tired Pain during sex Pelvic inflammatory disease (PID)--fever, abdominal pain, pelvic  pain, pain or trouble passing urine, spotting, bleeding during or after sex Unusual vaginal discharge, itching, or odor Vaginal pain, irritation, or sores Side effects that usually do not require medical attention (report these to your care team if they continue or are bothersome): Back pain Irregular menstrual cycles or spotting Menstrual cramps Muscle cramps, pain This list may not describe all possible side effects. Call your doctor for medical advice about side effects. You may report side effects to FDA at 1-800-FDA-1088. Where should I keep my medication? This does not apply. NOTE: This sheet is a summary. It may not cover all possible information. If you have questions about this medicine, talk to your doctor, pharmacist, or health care provider.  2022 Elsevier/Gold Standard (2020-10-16 00:00:00) Health Maintenance, Female Adopting a healthy lifestyle and getting preventive care are important in promoting health and wellness. Ask your health care provider about: The right schedule for you to have regular tests and exams. Things you can do on your own to prevent diseases and keep yourself healthy. What should I know about diet, weight, and exercise? Eat a healthy diet  Eat a diet that includes plenty of vegetables, fruits, low-fat dairy products, and lean protein. Do not eat a lot of foods that are high in solid fats, added sugars, or sodium. Maintain a healthy weight Body mass index (BMI) is used to identify weight problems. It estimates body fat based on height and weight. Your health care provider can help determine your BMI and help you achieve or maintain a healthy weight. Get regular exercise Get regular exercise. This is one of the most important things you can do for your health. Most adults should: Exercise for at least 150 minutes each week. The exercise should increase your heart rate and make you sweat (moderate-intensity exercise). Do strengthening exercises at least  twice a week. This is in addition to the moderate-intensity exercise. Spend less time sitting. Even light physical activity can be beneficial. Watch cholesterol and blood lipids Have your blood tested for lipids and cholesterol at 31 years of age, then have this test every 5 years. Have your cholesterol levels checked more often if: Your lipid or cholesterol levels are high. You are older than 31 years of age. You are at high risk for heart disease. What should I know about cancer screening? Depending on your health history and family history, you may need to have cancer screening at various ages. This may include screening for: Breast cancer. Cervical cancer. Colorectal cancer. Skin cancer. Lung cancer. What should I know about heart disease, diabetes, and high blood pressure? Blood pressure and heart disease High blood pressure causes heart disease and increases the risk of stroke. This is more likely to develop in people who have high blood pressure readings or are overweight. Have your blood pressure checked: Every 3-5 years if you are 7418-31 years of age. Every year if you are 31 years old or older. Diabetes Have regular diabetes screenings. This checks your fasting blood sugar level. Have the screening done: Once every three years after age 31 if you are at a normal weight and have a low risk for diabetes.  More often and at a younger age if you are overweight or have a high risk for diabetes. What should I know about preventing infection? Hepatitis B If you have a higher risk for hepatitis B, you should be screened for this virus. Talk with your health care provider to find out if you are at risk for hepatitis B infection. Hepatitis C Testing is recommended for: Everyone born from [birth year] through 1965. Anyone with known risk factors for hepatitis C. Sexually transmitted infections (STIs) Get screened for STIs, including gonorrhea and chlamydia, if: You are sexually active and are  younger than 31 years of age. You are older than 31 years of age and your health care provider tells you that you are at risk for this type of infection. Your sexual activity has changed since you were last screened, and you are at increased risk for chlamydia or gonorrhea. Ask your health care provider if you are at risk. Ask your health care provider about whether you are at high risk for HIV. Your health care provider may recommend a prescription medicine to help prevent HIV infection. If you choose to take medicine to prevent HIV, you should first get tested for HIV. You should then be tested every 3 months for as long as you are taking the medicine. Pregnancy If you are about to stop having your period (premenopausal) and you may become pregnant, seek counseling before you get pregnant. Take 400 to 800 micrograms (mcg) of folic acid every day if you become pregnant. Ask for birth control (contraception) if you want to prevent pregnancy. Osteoporosis and menopause Osteoporosis is a disease in which the bones lose minerals and strength with aging. This can result in bone fractures. If you are 59 years old or older, or if you are at risk for osteoporosis and fractures, ask your health care provider if you should: Be screened for bone loss. Take a calcium or vitamin D supplement to lower your risk of fractures. Be given hormone replacement therapy (HRT) to treat symptoms of menopause. Follow these instructions at home: Alcohol use Do not drink alcohol if: Your health care provider tells you not to drink. You are pregnant, may be pregnant, or are planning to become pregnant. If you drink alcohol: Limit how much you have to: 0-1 drink a day. Know how much alcohol is in your drink. In the [place] one drink equals one 12 oz bottle of beer (355 mL), one 5 oz glass of wine (148 mL), or one 1 oz glass of hard liquor (44 mL). Lifestyle Do not use any products that contain nicotine or tobacco. These  products include cigarettes, chewing tobacco, and vaping devices, such as e-cigarettes. If you need help quitting, ask your health care provider. Do not use street drugs. Do not share needles. Ask your health care provider for help if you need support or information about quitting drugs. General instructions Schedule regular health, dental, and eye exams. Stay current with your vaccines. Tell your health care provider if: You often feel depressed. You have ever been abused or do not feel safe at home. Summary Adopting a healthy lifestyle and getting preventive care are important in promoting health and wellness. Follow your health care provider's instructions about healthy diet, exercising, and getting tested or screened for diseases. Follow your health care provider's instructions on monitoring your cholesterol and blood pressure. This information is not intended to replace advice given to you by your health care provider. Make sure you discuss any questions you  have with your health care provider. Document Revised: 09/16/2020 Document Reviewed: 09/16/2020 Elsevier Patient Education  2022 [employer].

## 2021-06-13 NOTE — Progress Notes (Signed)
Anita Allen is a 31 y.o. [phone] female here for a routine annual gynecologic exam.  Current complaints: irregular cycles and post coital bleeding.   Has tried Nexplanon in the past. Had irregular cycles. Tolerated OCP's and cycles regular but not good at taking daily.   Gynecologic History No LMP recorded. Contraception: condoms Last Pap: Uncertain Last mammogram: NA  Obstetric History OB History  Gravida Para Term Preterm AB Living  7 3 3  0 3 3  SAB IAB Ectopic Multiple Live Births  1 2 0 0 3    # Outcome Date GA Lbr Len/2nd Weight Sex Delivery Anes PTL Lv  7 Gravida           6 Term 01/17/15 [redacted]w[redacted]d 08:29 / 00:26 6 lb 14.9 oz (3.145 kg) M Vag-Spont EPI  LIV  5 SAB 2015             Birth Comments: molar pregnancy  4 Term 02/24/12 [redacted]w[redacted]d / 01:03  M Vag-Spont EPI  LIV  3 Term 10/16/10 [redacted]w[redacted]d   F Vag-Spont   LIV  2 IAB 2010 [redacted]w[redacted]d         1 IAB             Past Medical History:  Diagnosis Date   BV (bacterial vaginosis)    Gonorrhea contact, treated    H/O dilation and curettage    Hx of trichomoniasis    Molar pregnancy 12-2013   PCOS (polycystic ovarian syndrome)    Vaginal delivery 2012, 2013   Yeast infection     Past Surgical History:  Procedure Laterality Date   brazillian butt lift     DILATION AND CURETTAGE OF UTERUS     DILATION AND EVACUATION N/A 12/20/2013   Procedure: DILATATION AND EVACUATION;  Surgeon: 02/19/2014, MD;  Location: [facility];  Service: Gynecology;  Laterality: N/A;   LIPOSUCTION TRUNK      Current Outpatient Medications on File Prior to Visit  Medication Sig Dispense Refill   phentermine (ADIPEX-P) 37.5 MG tablet Take 37.5 mg by mouth daily.     No current facility-administered medications on file prior to visit.    Allergies  Allergen Reactions   Augmentin [Amoxicillin-Pot Clavulanate] Hives and Other (See Comments)    Pt has taken Ceftriaxone in the past (??)   Latex Rash    Vaginal irritation    Social History    Socioeconomic History   Marital status: Married    Spouse name: Not on file   Number of children: Not on file   Years of education: Not on file   Highest education level: Not on file  Occupational History   Not on file  Tobacco Use   Smoking status: Never   Smokeless tobacco: Never  Vaping Use   Vaping Use: Never used  Substance and Sexual Activity   Alcohol use: No   Drug use: No   Sexual activity: Yes    Birth control/protection: None  Other Topics Concern   Not on file  Social History Narrative   ** Merged History Encounter **       Social Determinants of Health   Financial Resource Strain: Not on file  Food Insecurity: Not on file  Transportation Needs: Not on file  Physical Activity: Not on file  Stress: Not on file  Social Connections: Not on file  Intimate Partner Violence: Not on file    Family History  Problem Relation Age of Onset   Hypertension Mother  Heart attack Mother    Heart disease Father    Hypertension Father    Heart attack Father     The following portions of the patient's history were reviewed and updated as appropriate: allergies, current medications, past family history, past medical history, past social history, past surgical history and problem list.  Review of Systems Pertinent items noted in HPI and remainder of comprehensive ROS otherwise negative.   Objective:  BP 119/83    Pulse 73    Ht 5\' 2"  (1.575 m)    Wt 177 lb 6.4 oz (80.5 kg)    BMI 32.45 kg/m  CONSTITUTIONAL: Well-developed, well-nourished female in no acute distress.  HENT:  Normocephalic, atraumatic, External right and left ear normal. Oropharynx is clear and moist EYES: Conjunctivae and EOM are normal. Pupils are equal, round, and reactive to light. No scleral icterus.  NECK: Normal range of motion, supple, no masses.  Normal thyroid.  SKIN: Skin is warm and dry. No rash noted. Not diaphoretic. No erythema. No pallor. NEUROLGIC: Alert and oriented to person,  place, and time. Normal reflexes, muscle tone coordination. No cranial nerve deficit noted. PSYCHIATRIC: Normal mood and affect. Normal behavior. Normal judgment and thought content. CARDIOVASCULAR: Normal heart rate noted, regular rhythm RESPIRATORY: Clear to auscultation bilaterally. Effort and breath sounds normal, no problems with respiration noted. BREASTS: Symmetric in size. No masses, skin changes, nipple drainage, or lymphadenopathy. ABDOMEN: Soft, normal bowel sounds, no distention noted.  No tenderness, rebound or guarding.  PELVIC: Normal appearing external genitalia; normal appearing vaginal mucosa, cervix irritated.  No abnormal discharge noted.  Pap smear obtained.  Normal uterine size, no other palpable masses, no uterine or adnexal tenderness. MUSCULOSKELETAL: Normal range of motion. No tenderness.  No cyanosis, clubbing, or edema.  2+ distal pulses.   Assessment:  Annual gynecologic examination with pap smear AUB PCB Plan:  Will follow up results of pap smear and manage accordingly. STD testing. Check GYN [place]. Information on Paragard provided to pt.  Routine preventative health maintenance measures emphasized. Please refer to After Visit Summary for other counseling recommendations.  F/U in 3-4 weeks  , MD, FACOG Attending Obstetrician & Gynecologist Center for [facility] [place], [facility]

## 2021-06-13 NOTE — Progress Notes (Signed)
Pt c/o of irregular cycles for the last 12 months, and bleeding after intercourse.  Pt would also like to discuss b/c options

## 2021-06-16 ENCOUNTER — Other Ambulatory Visit: Payer: Self-pay | Admitting: *Deleted

## 2021-06-16 ENCOUNTER — Encounter: Payer: Self-pay | Admitting: Obstetrics and Gynecology

## 2021-06-16 LAB — CERVICOVAGINAL ANCILLARY ONLY
Bacterial Vaginitis (gardnerella): POSITIVE — AB
Candida Glabrata: NEGATIVE
Candida Vaginitis: NEGATIVE
Chlamydia: NEGATIVE
Comment: NEGATIVE
Comment: NEGATIVE
Comment: NEGATIVE
Comment: NEGATIVE
Comment: NEGATIVE
Comment: NORMAL
Neisseria Gonorrhea: NEGATIVE
Trichomonas: NEGATIVE

## 2021-06-16 MED ORDER — METRONIDAZOLE 500 MG PO TABS: 500.0000 mg | ORAL_TABLET | Freq: Two times a day (BID) | ORAL | 0 refills | Status: DC

## 2021-06-16 NOTE — Progress Notes (Signed)
Flagyl sent for +BV See lab note 

## 2021-06-19 ENCOUNTER — Ambulatory Visit (HOSPITAL_BASED_OUTPATIENT_CLINIC_OR_DEPARTMENT_OTHER)
Admission: RE | Admit: 2021-06-19 | Discharge: 2021-06-19 | Disposition: A | Discharge status: Home / Self Care | Payer: No Typology Code available for payment source | Source: Ambulatory Visit | Attending: Obstetrics and Gynecology | Admitting: Obstetrics and Gynecology

## 2021-06-19 ENCOUNTER — Ambulatory Visit (HOSPITAL_BASED_OUTPATIENT_CLINIC_OR_DEPARTMENT_OTHER): Payer: No Typology Code available for payment source

## 2021-06-19 ENCOUNTER — Other Ambulatory Visit: Payer: Self-pay

## 2021-06-19 ENCOUNTER — Encounter: Payer: Self-pay | Admitting: Emergency Medicine

## 2021-06-19 ENCOUNTER — Ambulatory Visit (INDEPENDENT_AMBULATORY_CARE_PROVIDER_SITE_OTHER): Payer: No Typology Code available for payment source | Admitting: Emergency Medicine

## 2021-06-19 VITALS — BP 112/60 | HR 86 | Ht 62.0 in | Wt 180.0 lb

## 2021-06-19 DIAGNOSIS — K5900 Constipation, unspecified: Secondary | ICD-10-CM | POA: Diagnosis not present

## 2021-06-19 DIAGNOSIS — R638 Other symptoms and signs concerning food and fluid intake: Secondary | ICD-10-CM | POA: Diagnosis not present

## 2021-06-19 DIAGNOSIS — R21 Rash and other nonspecific skin eruption: Secondary | ICD-10-CM | POA: Insufficient documentation

## 2021-06-19 DIAGNOSIS — N939 Abnormal uterine and vaginal bleeding, unspecified: Secondary | ICD-10-CM

## 2021-06-19 DIAGNOSIS — Z7689 Persons encountering health services in other specified circumstances: Secondary | ICD-10-CM

## 2021-06-19 DIAGNOSIS — E282 Polycystic ovarian syndrome: Secondary | ICD-10-CM

## 2021-06-19 MED ORDER — LINACLOTIDE 145 MCG PO CAPS: 145.0000 ug | ORAL_CAPSULE | Freq: Every day | ORAL | 3 refills | Status: DC

## 2021-06-19 NOTE — Assessment & Plan Note (Signed)
Chronic.  Failed over-the-counter medications. May benefit from daily Linzess 145 mg.

## 2021-06-19 NOTE — Assessment & Plan Note (Signed)
Requesting referral to nutritionist. Medical weight management referral placed today.

## 2021-06-19 NOTE — Assessment & Plan Note (Signed)
Chronic.  Needs dermatology evaluation. ?

## 2021-06-19 NOTE — Progress Notes (Signed)
Anita Allen 31 y.o.   Chief Complaint  Patient presents with   New Patient (Initial Visit)    Pt would like to discuss bleeding during sex, pt had scan done today, would like to dicuss results. Also, constipation     HISTORY OF PRESENT ILLNESS: This is a 31 y.o. female first visit to this office, here to establish care with me. Has history of dysfunctional uterine bleeding mostly during intercourse.  Had pelvic ultrasound done today.  Results not yet released. We will follow-up with a gynecologist who ordered this test. May have history of PCOS but not final. Has history of chronic constipation for 2 years. Also has several skin lesions she wants to show me. No other complaints or medical concerns today.  HPI   Prior to Admission medications   Medication Sig Start Date End Date Taking? Authorizing Provider  metroNIDAZOLE (FLAGYL) 500 MG tablet Take 1 tablet (500 mg total) by mouth 2 (two) times daily. 06/16/21   Hermina Staggers, MD  phentermine (ADIPEX-P) 37.5 MG tablet Take 37.5 mg by mouth daily. 01/07/21   [provider]    Allergies  Allergen Reactions   Augmentin [Amoxicillin-Pot Clavulanate] Hives and Other (See Comments)    Pt has taken Ceftriaxone in the past (??)   Latex Rash    Vaginal irritation    There are no problems to display for this patient.   Past Medical History:  Diagnosis Date   BV (bacterial vaginosis)    Gonorrhea contact, treated    H/O dilation and curettage    Hx of trichomoniasis    Molar pregnancy 12-2013   PCOS (polycystic ovarian syndrome)    Vaginal delivery 2012, 2013   Yeast infection     Past Surgical History:  Procedure Laterality Date   brazillian butt lift     DILATION AND CURETTAGE OF UTERUS     DILATION AND EVACUATION N/A 12/20/2013   Procedure: DILATATION AND EVACUATION;  Surgeon: Adam Phenix, MD;  Location: [facility];  Service: Gynecology;  Laterality: N/A;   LIPOSUCTION TRUNK      Social History    Socioeconomic History   Marital status: Married    Spouse name: Not on file   Number of children: Not on file   Years of education: Not on file   Highest education level: Not on file  Occupational History   Not on file  Tobacco Use   Smoking status: Never   Smokeless tobacco: Never  Vaping Use   Vaping Use: Never used  Substance and Sexual Activity   Alcohol use: No   Drug use: No   Sexual activity: Yes    Birth control/protection: None  Other Topics Concern   Not on file  Social History Narrative   ** Merged History Encounter **       Social Determinants of Health   Financial Resource Strain: Not on file  Food Insecurity: Not on file  Transportation Needs: Not on file  Physical Activity: Not on file  Stress: Not on file  Social Connections: Not on file  Intimate Partner Violence: Not on file    Family History  Problem Relation Age of Onset   Hypertension Mother    Heart attack Mother    Heart disease Father    Hypertension Father    Heart attack Father      Review of Systems  Constitutional: Negative.  Negative for chills and fever.  HENT: Negative.  Negative for congestion and sore throat.  Respiratory: Negative.  Negative for cough and shortness of breath.   Cardiovascular: Negative.  Negative for chest pain and palpitations.  Gastrointestinal:  Positive for constipation. Negative for abdominal pain, blood in stool, diarrhea, nausea and vomiting.  Genitourinary: Negative.        Postcoital bleeding  Skin:  Positive for rash.  Neurological: Negative.  Negative for dizziness and headaches.  All other systems reviewed and are negative.  Today's Vitals   06/19/21 1423  BP: 112/60  Pulse: 86  SpO2: 98%  Weight: 180 lb (81.6 kg)  Height: 5\' 2"  (1.575 m)   Body mass index is 32.92 kg/m.  Physical Exam Vitals reviewed.  Constitutional:      Appearance: Normal appearance.  HENT:     Head: Normocephalic.  Eyes:     Extraocular Movements:  Extraocular movements intact.     Pupils: Pupils are equal, round, and reactive to light.  Cardiovascular:     Rate and Rhythm: Normal rate and regular rhythm.     Pulses: Normal pulses.     Heart sounds: Normal heart sounds.  Pulmonary:     Effort: Pulmonary effort is normal.     Breath sounds: Normal breath sounds.  Musculoskeletal:     Cervical back: No tenderness.  Lymphadenopathy:     Cervical: No cervical adenopathy.  Skin:    General: Skin is warm and dry.     Capillary Refill: Capillary refill takes less than 2 seconds.     Findings: Rash (Left chin area and chest area) present.  Neurological:     General: No focal deficit present.     Mental Status: She is alert and oriented to person, place, and time.  Psychiatric:        Mood and Affect: Mood normal.        Behavior: Behavior normal.     ASSESSMENT & PLAN: A total of 47 was spent with the patient and counseling/coordination of care regarding preparing for this visit, review of most recent office visit notes and medical records available, review of most recent blood work results, review of chronic medical problems and their treatment, review of all medications, comprehensive history and physical examination, education on nutrition, need for referral to nutritionist, prognosis, documentation and need for follow-up.  Problem List Items Addressed This Visit       Endocrine   PCOS (polycystic ovarian syndrome)     Musculoskeletal and Integument   Rash and nonspecific skin eruption    Chronic.  Needs dermatology evaluation.      Relevant Orders   Ambulatory referral to Dermatology     Genitourinary   Abnormal uterine bleeding (AUB)    Followed up by gynecologist.  Had pelvic ultrasound done today. Results are still pending.        Other   Difficulty maintaining weight    Requesting referral to nutritionist. Medical weight management referral placed today.      Relevant Orders   Amb Ref to Medical Weight  Management   Constipation - Primary    Chronic.  Failed over-the-counter medications. May benefit from daily Linzess 145 mg.      Relevant Medications   linaclotide (LINZESS) 145 MCG CAPS capsule   Other Visit Diagnoses     Encounter to establish care          Patient Instructions  Health Maintenance, Female Adopting a healthy lifestyle and getting preventive care are important in promoting health and wellness. Ask your health care provider about: The right  schedule for you to have regular tests and exams. Things you can do on your own to prevent diseases and keep yourself healthy. What should I know about diet, weight, and exercise? Eat a healthy diet  Eat a diet that includes plenty of vegetables, fruits, low-fat dairy products, and lean protein. Do not eat a lot of foods that are high in solid fats, added sugars, or sodium. Maintain a healthy weight Body mass index (BMI) is used to identify weight problems. It estimates body fat based on height and weight. Your health care provider can help determine your BMI and help you achieve or maintain a healthy weight. Get regular exercise Get regular exercise. This is one of the most important things you can do for your health. Most adults should: Exercise for at least 150 minutes each week. The exercise should increase your heart rate and make you sweat (moderate-intensity exercise). Do strengthening exercises at least twice a week. This is in addition to the moderate-intensity exercise. Spend less time sitting. Even light physical activity can be beneficial. Watch cholesterol and blood lipids Have your blood tested for lipids and cholesterol at 31 years of age, then have this test every 5 years. Have your cholesterol levels checked more often if: Your lipid or cholesterol levels are high. You are older than 31 years of age. You are at high risk for heart disease. What should I know about cancer screening? Depending on your health  history and family history, you may need to have cancer screening at various ages. This may include screening for: Breast cancer. Cervical cancer. Colorectal cancer. Skin cancer. Lung cancer. What should I know about heart disease, diabetes, and high blood pressure? Blood pressure and heart disease High blood pressure causes heart disease and increases the risk of stroke. This is more likely to develop in people who have high blood pressure readings or are overweight. Have your blood pressure checked: Every 3-5 years if you are 63-2 years of age. Every year if you are 13 years old or older. Diabetes Have regular diabetes screenings. This checks your fasting blood sugar level. Have the screening done: Once every three years after age 51 if you are at a normal weight and have a low risk for diabetes. More often and at a younger age if you are overweight or have a high risk for diabetes. What should I know about preventing infection? Hepatitis B If you have a higher risk for hepatitis B, you should be screened for this virus. Talk with your health care provider to find out if you are at risk for hepatitis B infection. Hepatitis C Testing is recommended for: Everyone born from [birth year] through 1965. Anyone with known risk factors for hepatitis C. Sexually transmitted infections (STIs) Get screened for STIs, including gonorrhea and chlamydia, if: You are sexually active and are younger than 31 years of age. You are older than 31 years of age and your health care provider tells you that you are at risk for this type of infection. Your sexual activity has changed since you were last screened, and you are at increased risk for chlamydia or gonorrhea. Ask your health care provider if you are at risk. Ask your health care provider about whether you are at high risk for HIV. Your health care provider may recommend a prescription medicine to help prevent HIV infection. If you choose to take medicine to  prevent HIV, you should first get tested for HIV. You should then be tested every 3 months  for as long as you are taking the medicine. Pregnancy If you are about to stop having your period (premenopausal) and you may become pregnant, seek counseling before you get pregnant. Take 400 to 800 micrograms (mcg) of folic acid every day if you become pregnant. Ask for birth control (contraception) if you want to prevent pregnancy. Osteoporosis and menopause Osteoporosis is a disease in which the bones lose minerals and strength with aging. This can result in bone fractures. If you are 31 years old or older, or if you are at risk for osteoporosis and fractures, ask your health care provider if you should: Be screened for bone loss. Take a calcium or vitamin D supplement to lower your risk of fractures. Be given hormone replacement therapy (HRT) to treat symptoms of menopause. Follow these instructions at home: Alcohol use Do not drink alcohol if: Your health care provider tells you not to drink. You are pregnant, may be pregnant, or are planning to become pregnant. If you drink alcohol: Limit how much you have to: 0-1 drink a day. Know how much alcohol is in your drink. In the [place] one drink equals one 12 oz bottle of beer (355 mL), one 5 oz glass of wine (148 mL), or one 1 oz glass of hard liquor (44 mL). Lifestyle Do not use any products that contain nicotine or tobacco. These products include cigarettes, chewing tobacco, and vaping devices, such as e-cigarettes. If you need help quitting, ask your health care provider. Do not use street drugs. Do not share needles. Ask your health care provider for help if you need support or information about quitting drugs. General instructions Schedule regular health, dental, and eye exams. Stay current with your vaccines. Tell your health care provider if: You often feel depressed. You have ever been abused or do not feel safe at  home. Summary Adopting a healthy lifestyle and getting preventive care are important in promoting health and wellness. Follow your health care provider's instructions about healthy diet, exercising, and getting tested or screened for diseases. Follow your health care provider's instructions on monitoring your cholesterol and blood pressure. This information is not intended to replace advice given to you by your health care provider. Make sure you discuss any questions you have with your health care provider. Document Revised: 09/16/2020 Document Reviewed: 09/16/2020 Elsevier Patient Education  2022 Elsevier Inc.      Edwina BarthMiguel Ronte Parker, MD [facility]

## 2021-06-19 NOTE — Assessment & Plan Note (Signed)
Followed up by gynecologist.  Had pelvic ultrasound done today. Results are still pending.

## 2021-06-19 NOTE — Patient Instructions (Signed)

## 2021-06-20 ENCOUNTER — Encounter: Payer: Self-pay | Admitting: Obstetrics and Gynecology

## 2021-06-20 LAB — CYTOLOGY - PAP
Comment: NEGATIVE
Comment: NEGATIVE
HPV 16: NEGATIVE
HPV 18 / 45: NEGATIVE
High risk HPV: POSITIVE — AB

## 2021-06-23 ENCOUNTER — Encounter: Payer: Self-pay | Admitting: Obstetrics and Gynecology

## 2021-06-23 ENCOUNTER — Telehealth: Payer: Self-pay

## 2021-06-23 DIAGNOSIS — R87612 Low grade squamous intraepithelial lesion on cytologic smear of cervix (LGSIL): Secondary | ICD-10-CM | POA: Insufficient documentation

## 2021-06-23 NOTE — Telephone Encounter (Signed)
Pt called in about results, advised of results, need for colpo, pt voiced understanding, appt is scheduled.

## 2021-06-26 ENCOUNTER — Encounter: Payer: Self-pay | Admitting: Emergency Medicine

## 2021-06-27 ENCOUNTER — Other Ambulatory Visit (HOSPITAL_COMMUNITY)
Admission: RE | Admit: 2021-06-27 | Discharge: 2021-06-27 | Disposition: A | Discharge status: Home / Self Care | Payer: No Typology Code available for payment source | Source: Ambulatory Visit | Attending: Obstetrics and Gynecology | Admitting: Obstetrics and Gynecology

## 2021-06-27 ENCOUNTER — Encounter: Payer: Self-pay | Admitting: Obstetrics and Gynecology

## 2021-06-27 ENCOUNTER — Other Ambulatory Visit: Payer: Self-pay

## 2021-06-27 ENCOUNTER — Ambulatory Visit (INDEPENDENT_AMBULATORY_CARE_PROVIDER_SITE_OTHER): Payer: No Typology Code available for payment source | Admitting: Obstetrics and Gynecology

## 2021-06-27 VITALS — BP 131/90 | HR 68 | Wt 180.0 lb

## 2021-06-27 DIAGNOSIS — R87612 Low grade squamous intraepithelial lesion on cytologic smear of cervix (LGSIL): Secondary | ICD-10-CM

## 2021-06-27 DIAGNOSIS — Z01812 Encounter for preprocedural laboratory examination: Secondary | ICD-10-CM

## 2021-06-27 DIAGNOSIS — R87811 Vaginal high risk human papillomavirus (HPV) DNA test positive: Secondary | ICD-10-CM

## 2021-06-27 LAB — POCT URINE PREGNANCY: Preg Test, Ur: NEGATIVE

## 2021-06-27 NOTE — Progress Notes (Signed)
° ° °  GYNECOLOGY CLINIC COLPOSCOPY PROCEDURE NOTE  31 y.o. [id number] here for colposcopy for low-grade squamous intraepithelial neoplasia (LGSIL - encompassing HPV,mild dysplasia,CIN I) pap smear on 2/23. Discussed role for HPV in cervical dysplasia, need for surveillance.  Patient given informed consent, signed copy in the chart, time out was performed.  Placed in lithotomy position. Cervix viewed with speculum and colposcope after application of acetic acid.   Colposcopy adequate? Yes  acetowhite lesion(s) noted at 12 and 6  o'clock; corresponding biopsies obtained.  ECC specimen obtained. Monsel's appleid All specimens were labelled and sent to pathology.  Patient was given post procedure instructions.  Will follow up pathology and manage accordingly.  Routine preventative health maintenance measures emphasized.    Arlina Robes  MD, [place] Attending [place] for [facility], [place]

## 2021-06-27 NOTE — Telephone Encounter (Signed)
This is fairly typical behavior for this medication.  It takes a little while for the system to get used to it.  Continue drinking plenty of fluids and also supplement with fiber.  Thanks.

## 2021-06-27 NOTE — Progress Notes (Signed)
Pt presents for colposcopy s/p LSIL HR HPV pap Pt interested in Paragard. Last unprotected IC x 1 week ago.

## 2021-06-27 NOTE — Patient Instructions (Signed)
Colposcopy, Care After The following information offers guidance on how to care for yourself after your procedure. Your doctor may also give you more specific instructions. If you have problems or questions, contact your doctor. What can I expect after the procedure? If you did not have a sample of your tissue taken out (did not have a biopsy), you may only have some spotting of blood for a few days. You can go back to your normal activities. If you had a sample of your tissue taken out, it is common to have: Soreness and mild pain. These may last for a few days. Mild bleeding or fluid (discharge) coming from your vagina. The fluid will look dark and grainy. You may have this for a few days. The fluid may be caused by a liquid that was used during your procedure. You may need to wear a sanitary pad. Spotting of blood for at least 48 hours after the procedure. Follow these instructions at home: Medicines Take over-the-counter and prescription medicines only as told by your doctor. Ask your doctor what over-the-counter pain medicines and prescription medicines you can start taking again. This is very important if you take blood thinners. Activity For at least 3 days, or for as long as told by your doctor, avoid: Douching. Using tampons. Having sex. Return to your normal activities as told by your doctor. Ask your doctor what activities are safe for you. General instructions Ask your doctor if you may take baths, swim, or use a hot tub. You may take showers. If you use birth control (contraception), keep using it. Keep all follow-up visits. Contact a doctor if: You have a fever or chills. You faint or feel light-headed. Get help right away if: You bleed a lot from your vagina. A lot of bleeding means that the bleeding soaks through a pad in less than 1 hour. You have clumps of blood (blood clots) coming from your vagina. You have signs that could mean you have an infection. This may be fluid  coming from your vagina that is: Different than normal. Yellow. Bad-smelling. You have very bad pain or cramps in your lower belly that do not get better with medicine. Summary If you did not have a sample of your tissue taken out, you may only have some spotting of blood for a few days. You can go back to your normal activities. If you had a sample of your tissue taken out, it is common to have mild pain for a few days and spotting for 48 hours. Avoid douching, using tampons, and having sex for at least 3 days after the procedure or for as long as told. Get help right away if you have a lot of bleeding, very bad pain, or signs of infection. This information is not intended to replace advice given to you by your health care provider. Make sure you discuss any questions you have with your health care provider. Document Revised: 09/22/2020 Document Reviewed: 09/22/2020 Elsevier Patient Education  2022 Elsevier Inc.  

## 2021-06-30 LAB — SURGICAL PATHOLOGY

## 2021-07-02 ENCOUNTER — Other Ambulatory Visit: Payer: Self-pay

## 2021-07-02 ENCOUNTER — Encounter: Payer: Self-pay | Admitting: Obstetrics and Gynecology

## 2021-07-02 ENCOUNTER — Ambulatory Visit (INDEPENDENT_AMBULATORY_CARE_PROVIDER_SITE_OTHER): Payer: No Typology Code available for payment source | Admitting: Obstetrics and Gynecology

## 2021-07-02 VITALS — BP 127/84 | HR 61 | Ht 62.0 in | Wt 178.9 lb

## 2021-07-02 DIAGNOSIS — Z30430 Encounter for insertion of intrauterine contraceptive device: Secondary | ICD-10-CM | POA: Diagnosis not present

## 2021-07-02 DIAGNOSIS — Z3202 Encounter for pregnancy test, result negative: Secondary | ICD-10-CM

## 2021-07-02 DIAGNOSIS — R87612 Low grade squamous intraepithelial lesion on cytologic smear of cervix (LGSIL): Secondary | ICD-10-CM

## 2021-07-02 LAB — POCT URINE PREGNANCY: Preg Test, Ur: NEGATIVE

## 2021-07-02 MED ORDER — PARAGARD INTRAUTERINE COPPER IU IUD
1.0000 | INTRAUTERINE_SYSTEM | Freq: Once | INTRAUTERINE | Status: AC
  Administered 2021-07-02: 1 via INTRAUTERINE

## 2021-07-02 NOTE — Progress Notes (Signed)
Patient presents for IUD insertion. Patient denies having any intercourse since before 2/13 which was her LMP. Patient would like to discuss u/s results. No other concerns.

## 2021-07-02 NOTE — Patient Instructions (Signed)

## 2021-07-02 NOTE — Progress Notes (Signed)
° ° °  GYNECOLOGY CLINIC PROCEDURE NOTE  Anita Allen is a 31 y.o. [phone] here for Mirena IUD insertion. No GYN concerns.    IUD Insertion Procedure Note Patient identified, informed consent performed, consent signed.   Discussed risks of irregular bleeding, cramping, infection, malpositioning or misplacement of the IUD outside the uterus which may require further procedure such as laparoscopy. Time out was performed.  Urine pregnancy test negative.  Speculum placed in the vagina.  Cervix visualized.  Cleaned with Betadine x 2.  Grasped anteriorly with a single tooth tenaculum.  Uterus sounded to 7 cm. ParaGard IUD placed per manufacturer's recommendations.  Strings trimmed to 3 cm. Tenaculum was removed, good hemostasis noted.  Patient tolerated procedure well.   Patient was given post-procedure instructions.  She was advised to have backup contraception for one week.  Patient was also asked to check IUD strings periodically and follow up in 4 weeks for IUD check.    Nettie Elm MD, FACOG Attending Obstetrician & Gynecologist Center for [facility], [facility]

## 2021-07-09 ENCOUNTER — Ambulatory Visit: Payer: No Typology Code available for payment source | Admitting: Obstetrics and Gynecology

## 2021-07-31 ENCOUNTER — Encounter (HOSPITAL_BASED_OUTPATIENT_CLINIC_OR_DEPARTMENT_OTHER): Payer: Self-pay | Admitting: Emergency Medicine

## 2021-07-31 ENCOUNTER — Emergency Department (HOSPITAL_BASED_OUTPATIENT_CLINIC_OR_DEPARTMENT_OTHER)
Admission: EM | Admit: 2021-07-31 | Discharge: 2021-07-31 | Disposition: A | Discharge status: Home / Self Care | Payer: No Typology Code available for payment source | Attending: Emergency Medicine | Admitting: Emergency Medicine

## 2021-07-31 ENCOUNTER — Other Ambulatory Visit: Payer: Self-pay

## 2021-07-31 DIAGNOSIS — R6889 Other general symptoms and signs: Secondary | ICD-10-CM

## 2021-07-31 DIAGNOSIS — U071 COVID-19: Secondary | ICD-10-CM | POA: Insufficient documentation

## 2021-07-31 DIAGNOSIS — Z91040 Latex allergy status: Secondary | ICD-10-CM | POA: Diagnosis not present

## 2021-07-31 DIAGNOSIS — R059 Cough, unspecified: Secondary | ICD-10-CM | POA: Diagnosis present

## 2021-07-31 LAB — RESP PANEL BY RT-PCR (FLU A&B, COVID) ARPGX2
Influenza A by PCR: NEGATIVE
Influenza B by PCR: NEGATIVE
SARS Coronavirus 2 by RT PCR: POSITIVE — AB

## 2021-07-31 NOTE — ED Triage Notes (Signed)
Pt from home c/o generalized body aches. Pt also c/o runny nose and congestion ?

## 2021-07-31 NOTE — Discharge Instructions (Signed)
You came to the emergency department today with reports of Covid-19 like symptoms.  These symptoms may be due to Covid 19 or another viral illness.  You have a COVID test pending. Please isolate at home while awaiting your results.  You can view your results on your [facility] MyChart. ?> If your test is negative, stay home until your fever has resolved/your symptoms are improving. ?> If your test is positive, isolate at home for at least 5 days after the day your symptoms initially began, and THEN at least 24 hours after you are fever-free without the help of medications (Tylenol/acetaminophen and Advil/ibuprofen/Motrin) AND your symptoms are improving. ? ?You can alternate Tylenol/acetaminophen and Advil/ibuprofen/Motrin every 4 hours for sore throat, body aches, headache or fever.  ?Drink plenty of water.  ?Use saline nasal spray for congestion. ?Wash your hands frequently. ?Please rest as needed with frequent repositioning and ambulation as tolerated.   ? ?If you use a CPAP or BiPAP device for management of obstructive sleep apnea may continue to use it however use it when isolated from other individuals to avoid spread of COVID-19.   ?If you use a nebulizer administer medication such as albuterol you may continue to use it however only one isolated from other individuals to avoid the spread of COVID-19. ? ?If your symptoms do not improve please follow-up with your primary care provider or urgent care. ? ?Return to the ER for significant shortness of breath, uncontrollable vomiting, severe chest pain, inability to tolerate fluids, changes in mental status such as confusion or other concerning symptoms.  ?

## 2021-07-31 NOTE — ED Provider Notes (Signed)
?[facility] EMERGENCY DEPT ?Provider Note ? ? ?CSN: [id number] ?Arrival date & time: 07/31/21  1025 ? ?  ? ?History ? ?Chief Complaint  ?Patient presents with  ? Generalized Body Aches  ? ? ?Anita Allen is a 31 y.o. female with a past medical history of previous STI infection and molar pregnancy.  Presents to the emergency department with a chief complaint of flulike illness.  Patient reports that she went to [place] over the weekend and returned on Sunday.  Monday she began to have generalized body aches, nasal congestion, productive cough, and "a scratchy throat."  Patient is unsure what the mucus or cough is producing looks like.  Patient reports that she has been taking DayQuil and Allegra with some improvement in her symptoms.  Patient denies any known sick contacts.  Has been vaccinated for COVID-19 and influenza. ? ?Denies any fever, chills, shortness of breath, hemoptysis, chest pain, palpitations, abdominal pain, nausea, vomiting, diarrhea, dysuria, hematuria, urinary urgency, urinary frequency, vaginal pain, vaginal bleeding, vaginal discharge, lightheadedness, syncope. ? ?HPI ? ?  ? ?Home Medications ?Prior to Admission medications   ?Medication Sig Start Date End Date Taking? Authorizing Provider  ?linaclotide Karlene Einstein) 145 MCG CAPS capsule Take 1 capsule (145 mcg total) by mouth daily before breakfast. 06/19/21   Georgina Quint, MD  ?   ? ?Allergies    ?Augmentin [amoxicillin-pot clavulanate] and Latex   ? ?Review of Systems   ?Review of Systems  ?Constitutional:  Negative for chills and fever.  ?HENT:  Positive for congestion. Negative for ear pain, sinus pressure, sinus pain, sore throat, trouble swallowing and voice change.   ?Eyes:  Negative for visual disturbance.  ?Respiratory:  Positive for cough. Negative for shortness of breath.   ?Cardiovascular:  Negative for chest pain.  ?Gastrointestinal:  Negative for abdominal pain, nausea and vomiting.  ?Genitourinary:  Negative for  difficulty urinating, dysuria, flank pain, frequency, genital sores, hematuria, urgency, vaginal discharge and vaginal pain.  ?Musculoskeletal:  Negative for back pain and neck pain.  ?Skin:  Negative for color change and rash.  ?Neurological:  Negative for dizziness, syncope, light-headedness and headaches.  ?Psychiatric/Behavioral:  Negative for confusion.   ? ?Physical Exam ?Updated Vital Signs ?BP 120/76 (BP Location: Right Arm)   Pulse 100   Temp 98.4 ?F (36.9 ?C)   Resp 16   Ht 5\' 1"  (1.549 m)   Wt 80.7 kg   LMP 07/20/2021 (Approximate)   SpO2 96%   BMI 33.63 kg/m?  ?Physical Exam ?Vitals and nursing note reviewed.  ?Constitutional:   ?   General: She is not in acute distress. ?   Appearance: She is not ill-appearing, toxic-appearing or diaphoretic.  ?HENT:  ?   Head: Normocephalic.  ?   Right Ear: Tympanic membrane, ear canal and external ear normal. No foreign body. No mastoid tenderness.  ?   Left Ear: Tympanic membrane, ear canal and external ear normal. No mastoid tenderness.  ?   Nose:  ?   Right Sinus: No maxillary sinus tenderness or frontal sinus tenderness.  ?   Left Sinus: No maxillary sinus tenderness or frontal sinus tenderness.  ?   Mouth/Throat:  ?   Lips: Pink. No lesions.  ?   Tongue: No lesions. Tongue does not deviate from midline.  ?   Palate: No mass and lesions.  ?   Pharynx: Oropharynx is clear. Uvula midline. No pharyngeal swelling, oropharyngeal exudate, posterior oropharyngeal erythema or uvula swelling.  ?   Tonsils:  No tonsillar exudate or tonsillar abscesses. 1+ on the right. 1+ on the left.  ?   Comments: Handles all secretions without difficulty ?Eyes:  ?   General: No scleral icterus.    ?   Right eye: No discharge.     ?   Left eye: No discharge.  ?Neck:  ?   Comments: No swelling to submandibular space ?Cardiovascular:  ?   Rate and Rhythm: Normal rate.  ?Pulmonary:  ?   Effort: Pulmonary effort is normal. No tachypnea, bradypnea or respiratory distress.  ?   Breath  sounds: Normal breath sounds. No stridor.  ?Abdominal:  ?   General: Abdomen is flat. There is no distension. There are no signs of injury.  ?   Palpations: Abdomen is soft. There is no mass or pulsatile mass.  ?   Tenderness: There is no abdominal tenderness. There is no right CVA tenderness, left CVA tenderness, guarding or rebound.  ?Musculoskeletal:  ?   Cervical back: Normal range of motion and neck supple. No edema, erythema, signs of trauma, rigidity, torticollis or crepitus. No pain with movement, spinous process tenderness or muscular tenderness. Normal range of motion.  ?Skin: ?   General: Skin is warm and dry.  ?Neurological:  ?   General: No focal deficit present.  ?   Mental Status: She is alert.  ?Psychiatric:     ?   Behavior: Behavior is cooperative.  ? ? ?ED Results / Procedures / Treatments   ?Labs ?(all labs ordered are listed, but only abnormal results are displayed) ?Labs Reviewed  ?RESP PANEL BY RT-PCR (FLU A&B, COVID) ARPGX2  ? ? ?EKG ?None ? ?Radiology ?No results found. ? ?Procedures ?Procedures  ? ? ?Medications Ordered in ED ?Medications - No data to display ? ?ED Course/ Medical Decision Making/ A&P ?  ?                        ?Medical Decision Making ? ?Alert 31 year old female in no acute distress, nontoxic-appearing.  Patient is afebrile.  Presents to the emergency department with a complaint of flulike illness. ? ?Information obtained from patient.  Past medical records were reviewed including previous provider notes and labs.  Patient has medical history as outlined in HPI which complicates her care. ? ?Patient has flulike illness after reports of travel to [place].  Is afebrile without tachycardia at this time.  No known sick contacts.  Patient has been vaccinated for COVID-19 and influenza.  No reports of abdominal pain, nausea, vomiting, diarrhea; abdomen is soft, nondistended, nontender with no guarding or rebound tenderness.  Obtaining CMP, CBC, lipase and UA are considered  however no abdominal complaints at this time.  Lungs clear to auscultation bilaterally; low suspicion for pneumonia at this time.   ? ?Due to patient's reports of symptoms we will swab for COVID-19 and influenza.  Testing is pending at this time.  Patient would not be a candidate for Tamiflu for COVID-19 antiviral medication at this time.  Patient is hemodynamically stable.  Will discharge to follow-up with results on [facility] My[facility].  Discussed isolation precautions and symptomatic treatment with patient. ? ?Discussed results, findings, treatment and follow up. Patient advised of return precautions. Patient verbalized understanding and agreed with plan. ? ? ? ? ? ? ? ? ?Final Clinical Impression(s) / ED Diagnoses ?Final diagnoses:  ?None  ? ? ?Rx / DC Orders ?ED Discharge Orders   ? ? None  ? ?  ? ? ?  ?  Haskel SchroederBadalamente, Clotilda Hafer R, PA-C ?07/31/21 1307 ? ?  ?Rozelle LoganHorton, Kristie M, DO ?07/31/21 1549 ? ?

## 2021-07-31 NOTE — ED Notes (Signed)
Pt notified that she had covid and advised to quarantine for 5 days, stay hydrated and take OTC medications as directed. . Pt stated that she wanted to go home. Pt. Encouraged to come back if symptoms did not inprove. ?

## 2021-08-11 ENCOUNTER — Ambulatory Visit: Payer: No Typology Code available for payment source | Admitting: Obstetrics and Gynecology

## 2021-08-13 ENCOUNTER — Other Ambulatory Visit (HOSPITAL_COMMUNITY): Payer: Self-pay

## 2021-08-13 ENCOUNTER — Ambulatory Visit (INDEPENDENT_AMBULATORY_CARE_PROVIDER_SITE_OTHER): Payer: No Typology Code available for payment source | Admitting: Obstetrics and Gynecology

## 2021-08-13 ENCOUNTER — Encounter: Payer: Self-pay | Admitting: Obstetrics and Gynecology

## 2021-08-13 ENCOUNTER — Other Ambulatory Visit (HOSPITAL_COMMUNITY)
Admission: RE | Admit: 2021-08-13 | Discharge: 2021-08-13 | Disposition: A | Discharge status: Home / Self Care | Payer: No Typology Code available for payment source | Source: Ambulatory Visit | Attending: Obstetrics and Gynecology | Admitting: Obstetrics and Gynecology

## 2021-08-13 VITALS — BP 101/66 | HR 72 | Ht 62.0 in | Wt 175.0 lb

## 2021-08-13 DIAGNOSIS — Z30431 Encounter for routine checking of intrauterine contraceptive device: Secondary | ICD-10-CM

## 2021-08-13 DIAGNOSIS — Z23 Encounter for immunization: Secondary | ICD-10-CM | POA: Diagnosis not present

## 2021-08-13 DIAGNOSIS — B3731 Acute candidiasis of vulva and vagina: Secondary | ICD-10-CM | POA: Diagnosis present

## 2021-08-13 MED ORDER — FLUCONAZOLE 150 MG PO TABS
150.0000 mg | ORAL_TABLET | Freq: Every day | ORAL | 0 refills | Status: DC
  Filled 2021-08-13: qty 2, 4d supply, fill #0

## 2021-08-13 NOTE — Progress Notes (Signed)
? ? ?  GYNECOLOGY OFFICE ENCOUNTER NOTE ? ?History:  ?31 y.o. [id number] here today for today for IUD string check; Mirena  IUD was placed  07/02/2021. No complaints about the IUD, no concerning side effects. ? ?The following portions of the patient's history were reviewed and updated as appropriate: allergies, current medications, past family history, past medical history, past social history, past surgical history and problem list. Last pap smear on 06/13/2021 was abnormal.  ? ?Review of Systems:  ?Pertinent items are noted in HPI. ?  ?Objective:  ?Physical Exam ?Blood pressure 101/66, pulse 72, height 5\' 2"  (1.575 m), weight 175 lb (79.4 kg), last menstrual period 07/20/2021. ?CONSTITUTIONAL: Well-developed, well-nourished female in no acute distress.  ?NEUROLOGIC: Alert and oriented to person, place, and time. Normal reflexes, muscle tone coordination.  ?PSYCHIATRIC: Normal mood and affect. Normal behavior. Normal judgment and thought content. ?CARDIOVASCULAR: Normal heart rate noted ?RESPIRATORY: Effort and breath sounds normal, no problems with respiration noted ?ABDOMEN: Soft, no distention noted.   ?PELVIC: Normal appearing external genitalia; normal appearing vaginal mucosa. Thick white discharge noted at cervix. Patient having itching. IUD strings visualized, about 2 cm in length outside cervix. Done in the presence of a chaperone.  ? ?Assessment & Plan:  ?Patient to keep IUD in place for up to eight years; can come in for removal earlier if she desires or for any concerning side effects. Recommended condoms for STI prevention. ? ?RX: Diflucan ? ? ?Qiana Landgrebe, 09/19/2021, NP ?Faculty Practice ?[facility], [facility]  ?

## 2021-08-13 NOTE — Progress Notes (Addendum)
31 y.o GYN presents for IUD check.  C/o itching, "clumpy pieces whenever she wipes" and pelvic pain 10/10 during intercourse.  Denies discharge, odor. ? ?1st. Dose of Gardasil given in LD, tolerated well. Patient instructed to make appointments for 2nd. Injection 2 months from today and 3rd. In 6 months form today. ? ?  ?

## 2021-08-14 LAB — CERVICOVAGINAL ANCILLARY ONLY
Candida Glabrata: NEGATIVE
Candida Vaginitis: POSITIVE — AB
Chlamydia: NEGATIVE
Comment: NEGATIVE
Comment: NEGATIVE
Comment: NEGATIVE
Comment: NEGATIVE
Comment: NORMAL
Neisseria Gonorrhea: NEGATIVE
Trichomonas: NEGATIVE

## 2021-08-28 ENCOUNTER — Ambulatory Visit (HOSPITAL_COMMUNITY)
Admission: EM | Admit: 2021-08-28 | Discharge: 2021-08-28 | Disposition: A | Discharge status: Home / Self Care | Payer: No Typology Code available for payment source | Attending: Emergency Medicine | Admitting: Emergency Medicine

## 2021-08-28 ENCOUNTER — Other Ambulatory Visit: Payer: Self-pay

## 2021-08-28 ENCOUNTER — Encounter (HOSPITAL_COMMUNITY): Payer: Self-pay | Admitting: Emergency Medicine

## 2021-08-28 DIAGNOSIS — Z113 Encounter for screening for infections with a predominantly sexual mode of transmission: Secondary | ICD-10-CM | POA: Diagnosis not present

## 2021-08-28 DIAGNOSIS — N898 Other specified noninflammatory disorders of vagina: Secondary | ICD-10-CM | POA: Insufficient documentation

## 2021-08-28 MED ORDER — CEFTRIAXONE SODIUM 500 MG IJ SOLR
INTRAMUSCULAR | Status: AC
  Filled 2021-08-28: qty 500

## 2021-08-28 MED ORDER — AZITHROMYCIN 250 MG PO TABS
1000.0000 mg | ORAL_TABLET | Freq: Once | ORAL | Status: AC
  Administered 2021-08-28: 1000 mg via ORAL

## 2021-08-28 MED ORDER — LIDOCAINE HCL (PF) 1 % IJ SOLN
INTRAMUSCULAR | Status: AC
  Filled 2021-08-28: qty 2

## 2021-08-28 MED ORDER — AZITHROMYCIN 250 MG PO TABS
ORAL_TABLET | ORAL | Status: AC
  Filled 2021-08-28: qty 4

## 2021-08-28 MED ORDER — CEFTRIAXONE SODIUM 500 MG IJ SOLR
500.0000 mg | Freq: Once | INTRAMUSCULAR | Status: AC
  Administered 2021-08-28: 500 mg via INTRAMUSCULAR

## 2021-08-28 NOTE — ED Triage Notes (Signed)
Pt reports that her boyfriend had a knot in left groin and went to get checked today and came back reporting he had blood work and shots. Pt reports that she was just negative on 4/5 for STDs and asked partner if he had stepped out as to why he went and got tested today for a boil. Pt wants to get STD tested again since unsure if partner has STD or not. Reports currently treated for yeast infection and has recurrent BV.  ?

## 2021-08-28 NOTE — Discharge Instructions (Addendum)
We have treated you empirically for gonorrhea and chlamydia today.  We will call in further prescriptions based off of today's swab.  Give [place] a working phone number so that we can contact you if needed. Refrain from sexual contact until all of your labs have come back, symptoms have resolved, and your partner(s) are treated if necessary.  ? ?Go to www.goodrx.com  or www.costplusdrugs.com to look up your medications. This will give you a list of where you can find your prescriptions at the most affordable prices. Or ask the pharmacist what the cash price is, or if they have any other discount programs available to help make your medication more affordable. This can be less expensive than what you would pay with insurance.   ?

## 2021-08-28 NOTE — ED Provider Notes (Signed)
HPI ? ?SUBJECTIVE: ? ?Anita Allen is a 31 y.o. female who presents for STD screening.  She reports 2 days of thick, nonodorous, white vaginal discharge.  No genital rash, itching, nausea, vomiting, fevers, abdominal, back, pelvic pain, urinary complaints.  She had some dark, odorous urine last week, but it resolved.  She had negative STD testing on 4/5, but states that her boyfriend went to an urgent care today with a "boil" in his groin, and was tested and treated for all STDs.  She states that he will not reveal exactly what for or why he was treated.  She denies having other partners.  She is not sure about him.  She tried 2 doses of Flagyl without improvement in her symptoms.  No aggravating factors.  Patient is requesting STD treatment today.  She has a past medical history of gonorrhea, chlamydia, trichomonas, frequent BV and yeast.  No history of HIV, HSV, syphilis, diabetes.  LMP: Last week.  Denies possibility being pregnant.  PCP: Kyra Searles. ? ? ?Past Medical History:  ?Diagnosis Date  ? BV (bacterial vaginosis)   ? Gonorrhea contact, treated   ? H/O dilation and curettage   ? Hx of trichomoniasis   ? Molar pregnancy 12-2013  ? PCOS (polycystic ovarian syndrome)   ? Vaginal delivery 2012, 2013  ? Yeast infection   ? ? ?Past Surgical History:  ?Procedure Laterality Date  ? brazillian butt lift    ? DILATION AND CURETTAGE OF UTERUS    ? DILATION AND EVACUATION N/A 12/20/2013  ? Procedure: DILATATION AND EVACUATION;  Surgeon: Adam Phenix, MD;  Location: [facility];  Service: Gynecology;  Laterality: N/A;  ? LIPOSUCTION TRUNK    ? ? ?Family History  ?Problem Relation Age of Onset  ? Hypertension Mother   ? Heart attack Mother   ? Heart disease Father   ? Hypertension Father   ? Heart attack Father   ? ? ?Social History  ? ?Tobacco Use  ? Smoking status: Never  ? Smokeless tobacco: Never  ?Vaping Use  ? Vaping Use: Never used  ?Substance Use Topics  ? Alcohol use: No  ? Drug use: No  ? ? ?No  current facility-administered medications for this encounter. ? ?Current Outpatient Medications:  ?  Biotin 100 MG/GM POWD, Take by mouth., Disp: , Rfl:  ?  linaclotide (LINZESS) 145 MCG CAPS capsule, Take 1 capsule (145 mcg total) by mouth daily before breakfast., Disp: 30 capsule, Rfl: 3 ?  phentermine (ADIPEX-P) 37.5 MG tablet, Take 37.5 mg by mouth daily., Disp: , Rfl:  ?  Prenatal Vit-Fe Fumarate-FA (PRENATAL VITAMINS) 28-0.8 MG TABS, Take by mouth., Disp: , Rfl:  ? ?Allergies  ?Allergen Reactions  ? Augmentin [Amoxicillin-Pot Clavulanate] Hives and Other (See Comments)  ?  Pt has taken Ceftriaxone in the past (??)  ? Latex Rash  ?  Vaginal irritation  ? ? ? ?ROS ? ?As noted in HPI.  ? ?Physical Exam ? ?BP 124/89 (BP Location: Right Arm)   Pulse 96   Temp 98.5 ?F (36.9 ?C) (Oral)   Resp 17   LMP 08/11/2021   SpO2 99%  ? ?Constitutional: Well developed, well nourished, no acute distress ?Eyes:  EOMI, conjunctiva normal bilaterally ?HENT: Normocephalic, atraumatic,mucus membranes moist ?Respiratory: Normal inspiratory effort ?Cardiovascular: Normal rate ?GI: nondistended ?skin: No rash, skin intact ?Musculoskeletal: no deformities ?Neurologic: Alert & oriented x 3, no focal neuro deficits ?Psychiatric: Speech and behavior appropriate ? ? ?ED Course ? ? ?  Medications  ?azithromycin (ZITHROMAX) tablet 1,000 mg (1,000 mg Oral Given 08/28/21 1905)  ?cefTRIAXone (ROCEPHIN) injection 500 mg (500 mg Intramuscular Given 08/28/21 1906)  ? ? ?No orders of the defined types were placed in this encounter. ? ? ?No results found for this or any previous visit (from the past 24 hour(s)). ?No results found. ? ?ED Clinical Impression ? ?1. Vaginal discharge   ?2. Screen for STD (sexually transmitted disease)   ?  ? ?ED Assessment/Plan ? ?Swab for gonorrhea, chlamydia, trichomonas, BV, yeast sent.  Patient declined HIV, RPR testing.  Patient requesting empiric STD treatment today with Rocephin and azithromycin.  Discussed with  her that this is no longer the standard of treatment, but patient states that she has difficulty remembering to take antibiotics.  Further treatment will be based on labs.  Follow-up with her PCP as needed. ? ?Meds ordered this encounter  ?Medications  ? azithromycin (ZITHROMAX) tablet 1,000 mg  ? cefTRIAXone (ROCEPHIN) injection 500 mg  ? ? ? ? ?*This clinic note was created using Scientist, clinical (histocompatibility and immunogenetics). Therefore, there may be occasional mistakes despite careful proofreading. ? ?? ? ?  ?Domenick Gong, MD ?08/28/21 1919 ? ?

## 2021-08-29 ENCOUNTER — Other Ambulatory Visit (HOSPITAL_COMMUNITY): Payer: Self-pay

## 2021-08-29 ENCOUNTER — Telehealth (HOSPITAL_COMMUNITY): Payer: Self-pay | Admitting: Emergency Medicine

## 2021-08-29 LAB — CERVICOVAGINAL ANCILLARY ONLY
Bacterial Vaginitis (gardnerella): POSITIVE — AB
Candida Glabrata: NEGATIVE
Candida Vaginitis: NEGATIVE
Chlamydia: NEGATIVE
Comment: NEGATIVE
Comment: NEGATIVE
Comment: NEGATIVE
Comment: NEGATIVE
Comment: NEGATIVE
Comment: NORMAL
Neisseria Gonorrhea: POSITIVE — AB
Trichomonas: NEGATIVE

## 2021-08-29 MED ORDER — METRONIDAZOLE 500 MG PO TABS
500.0000 mg | ORAL_TABLET | Freq: Two times a day (BID) | ORAL | 0 refills | Status: DC
  Filled 2021-08-29: qty 14, 7d supply, fill #0

## 2021-09-03 ENCOUNTER — Ambulatory Visit: Payer: No Typology Code available for payment source | Admitting: Emergency Medicine

## 2021-09-08 ENCOUNTER — Other Ambulatory Visit (HOSPITAL_COMMUNITY): Payer: Self-pay

## 2021-09-23 ENCOUNTER — Ambulatory Visit (HOSPITAL_COMMUNITY)
Admission: EM | Admit: 2021-09-23 | Discharge: 2021-09-23 | Disposition: A | Discharge status: Home / Self Care | Payer: No Typology Code available for payment source | Attending: Internal Medicine | Admitting: Internal Medicine

## 2021-09-23 ENCOUNTER — Encounter (HOSPITAL_COMMUNITY): Payer: Self-pay | Admitting: *Deleted

## 2021-09-23 ENCOUNTER — Other Ambulatory Visit: Payer: Self-pay

## 2021-09-23 DIAGNOSIS — Z202 Contact with and (suspected) exposure to infections with a predominantly sexual mode of transmission: Secondary | ICD-10-CM | POA: Insufficient documentation

## 2021-09-23 DIAGNOSIS — Z113 Encounter for screening for infections with a predominantly sexual mode of transmission: Secondary | ICD-10-CM | POA: Insufficient documentation

## 2021-09-23 DIAGNOSIS — N898 Other specified noninflammatory disorders of vagina: Secondary | ICD-10-CM | POA: Insufficient documentation

## 2021-09-23 DIAGNOSIS — R1030 Lower abdominal pain, unspecified: Secondary | ICD-10-CM | POA: Insufficient documentation

## 2021-09-23 LAB — POC URINE PREG, ED: Preg Test, Ur: NEGATIVE

## 2021-09-23 LAB — POCT URINALYSIS DIPSTICK, ED / UC
Bilirubin Urine: NEGATIVE
Glucose, UA: NEGATIVE mg/dL
Ketones, ur: NEGATIVE mg/dL
Nitrite: NEGATIVE
Protein, ur: NEGATIVE mg/dL
Specific Gravity, Urine: >=1.03 (ref 1.005–1.030)
Urobilinogen, UA: 0.2 mg/dL (ref 0.0–1.0)
pH: 5.5 (ref 5.0–8.0)

## 2021-09-23 MED ORDER — CEFTRIAXONE SODIUM 1 G IJ SOLR
1.0000 g | Freq: Once | INTRAMUSCULAR | Status: AC
  Administered 2021-09-23: 1 g via INTRAMUSCULAR

## 2021-09-23 MED ORDER — LIDOCAINE HCL (PF) 1 % IJ SOLN
INTRAMUSCULAR | Status: AC
  Filled 2021-09-23: qty 2

## 2021-09-23 MED ORDER — CEFTRIAXONE SODIUM 1 G IJ SOLR
INTRAMUSCULAR | Status: AC
  Filled 2021-09-23: qty 10

## 2021-09-23 NOTE — ED Provider Notes (Signed)
?[facility] ? ? ? ?CSN: [id number] ?Arrival date & time: 09/23/21  1727 ? ? ?  ? ?History   ?Chief Complaint ?Chief Complaint  ?Patient presents with  ? Exposure to STD  ? ? ?HPI ?Anita Allen is a 31 y.o. female.  ? ?Patient presents for lower abdominal pain and vaginal odor that has been present for a few days.  Patient was recently seen on 08/28/2021 and treated for gonorrhea and chlamydia exposure with IM Rocephin for gonorrhea and metronidazole for BV that was positive on cervicovaginal swab.  Patient reports that her original infection was due to her sexual partner being unfaithful.  She states that the infection came from the female that he had sexual intercourse with.  Patient states that both her and her sexual partner were evaluated and treated for gonorrhea, but he was unfaithful again and had sexual intercourse with previous sexual partner who had not been yet treated after he was treated.  Patient's current symptoms started after having intercourse a second time that was unprotected. Original symptoms had resolved with previous treatment. Patient reports her symptoms are similar to gonorrhea symptoms at previous urgent care visit.  Denies vaginal discharge, dysuria, urinary frequency, back pain, pain, documented fevers.  Last menstrual cycle was a few days prior. ? ? ?Exposure to STD ? ? ?Past Medical History:  ?Diagnosis Date  ? BV (bacterial vaginosis)   ? Gonorrhea contact, treated   ? H/O dilation and curettage   ? Hx of trichomoniasis   ? Molar pregnancy 12-2013  ? PCOS (polycystic ovarian syndrome)   ? Vaginal delivery 2012, 2013  ? Yeast infection   ? ? ?Patient Active Problem List  ? Diagnosis Date Noted  ? Encounter for insertion of copper IUD 07/02/2021  ? LGSIL on Pap smear of cervix 06/23/2021  ? Difficulty maintaining weight 06/19/2021  ? Constipation 06/19/2021  ? Rash and nonspecific skin eruption 06/19/2021  ? PCOS (polycystic ovarian syndrome) 06/19/2021  ? Abnormal uterine  bleeding (AUB) 06/13/2021  ? ? ?Past Surgical History:  ?Procedure Laterality Date  ? brazillian butt lift    ? DILATION AND CURETTAGE OF UTERUS    ? DILATION AND EVACUATION N/A 12/20/2013  ? Procedure: DILATATION AND EVACUATION;  Surgeon: Woodroe Mode, MD;  Location: [facility];  Service: Gynecology;  Laterality: N/A;  ? LIPOSUCTION TRUNK    ? ? ?OB History   ? ? Gravida  ?7  ? Para  ?3  ? Term  ?3  ? Preterm  ?0  ? AB  ?3  ? Living  ?3  ?  ? ? SAB  ?1  ? IAB  ?2  ? Ectopic  ?0  ? Multiple  ?0  ? Live Births  ?3  ?   ?  ?  ? ? ? ?Home Medications   ? ? ? ?Family History ?Family History  ?Problem Relation Age of Onset  ? Hypertension Mother   ? Heart attack Mother   ? Heart disease Father   ? Hypertension Father   ? Heart attack Father   ? ? ?Social History ?Social History  ? ?Tobacco Use  ? Smoking status: Never  ? Smokeless tobacco: Never  ?Vaping Use  ? Vaping Use: Never used  ?Substance Use Topics  ? Alcohol use: No  ? Drug use: No  ? ? ? ?Allergies   ?Augmentin [amoxicillin-pot clavulanate] and Latex ? ? ?Review of Systems ?Review of Systems ?Per HPI ? ?Physical Exam ?Triage Vital  Signs ?ED Triage Vitals  ?Enc Vitals Group  ?   BP --   ?   Pulse Rate 09/23/21 1809 86  ?   Resp 09/23/21 1809 18  ?   Temp 09/23/21 1809 98.4 ?F (36.9 ?C)  ?   Temp src --   ?   SpO2 09/23/21 1809 96 %  ?   Weight --   ?   Height --   ?   Head Circumference --   ?   Peak Flow --   ?   Pain Score 09/23/21 1806 5  ?   Pain Loc --   ?   Pain Edu? --   ?   Excl. in [place]? --   ? ?No data found. ? ?Updated Vital Signs ?Pulse 86   Temp 98.4 ?F (36.9 ?C)   Resp 18   LMP 09/16/2021   SpO2 96%  ? ?Visual Acuity ?Right Eye Distance:   ?Left Eye Distance:   ?Bilateral Distance:   ? ?Right Eye Near:   ?Left Eye Near:    ?Bilateral Near:    ? ?Physical Exam ?Constitutional:   ?   General: She is not in acute distress. ?   Appearance: Normal appearance. She is not toxic-appearing or diaphoretic.  ?HENT:  ?   Head: Normocephalic and atraumatic.   ?Eyes:  ?   Extraocular Movements: Extraocular movements intact.  ?   Conjunctiva/sclera: Conjunctivae normal.  ?Cardiovascular:  ?   Rate and Rhythm: Normal rate and regular rhythm.  ?   Pulses: Normal pulses.  ?   Heart sounds: Normal heart sounds.  ?Pulmonary:  ?   Effort: Pulmonary effort is normal. No respiratory distress.  ?   Breath sounds: Normal breath sounds.  ?Abdominal:  ?   General: Bowel sounds are normal. There is no distension.  ?   Palpations: Abdomen is soft.  ?   Tenderness: There is no abdominal tenderness.  ?Genitourinary: ?   Comments: Deferred with shared decision making.  Self swab performed. ?Neurological:  ?   General: No focal deficit present.  ?   Mental Status: She is alert and oriented to person, place, and time. Mental status is at baseline.  ?Psychiatric:     ?   Mood and Affect: Mood normal.     ?   Behavior: Behavior normal.     ?   Thought Content: Thought content normal.     ?   Judgment: Judgment normal.  ? ? ? ?UC Treatments / Results  ?Labs ?(all labs ordered are listed, but only abnormal results are displayed) ?Labs Reviewed  ?POCT URINALYSIS DIPSTICK, ED / UC - Abnormal; Notable for the following components:  ?    Result Value  ? Hgb urine dipstick MODERATE (*)   ? Leukocytes,Ua SMALL (*)   ? All other components within normal limits  ?URINE CULTURE  ?POC URINE PREG, ED  ?CERVICOVAGINAL ANCILLARY ONLY  ? ? ?EKG ? ? ?Radiology ?No results found. ? ?Procedures ?Procedures (including critical care time) ? ?Medications Ordered in UC ?Medications  ?cefTRIAXone (ROCEPHIN) injection 1 g (has no administration in time range)  ? ? ?Initial Impression / Assessment and Plan / UC Course  ?I have reviewed the triage vital signs and the nursing notes. ? ?Pertinent labs & imaging results that were available during my care of the patient were reviewed by me and considered in my medical decision making (see chart for details). ? ?  ? ?Due to possible reexposure to  gonorrhea, will treat with  IM Rocephin again today in urgent care.  Will give higher dose given recurrent infection and given best evidence,  will await cervicovaginal swab for any further treatment.  Urinalysis showing small amount of leukocytes but this is most likely due to STD exposure and infection.  Will send urine culture to confirm.  Urine pregnancy negative.  Discussed safe sex practices and ensuring that patient's sexual partners get treated and evaluated.  Discussed return precautions.  Patient verbalized understanding and was agreeable with plan. ?Final Clinical Impressions(s) / UC Diagnoses  ? ?Final diagnoses:  ?STD exposure  ?Screening examination for venereal disease  ?Lower abdominal pain  ?Vaginal odor  ? ? ? ?Discharge Instructions   ? ?  ?You are being treated for gonorrhea reinfection with Rocephin again today.  Please refrain from sexual activity for at least 7 days following treatment.  Please be sure that your sexual partner gets evaluated and treated again.  Please use condoms for sexual intercourse.  Pregnancy test was negative.  We will send urine culture to confirm.  your vaginal swab is pending.  We will call if it is positive. ? ? ? ? ?ED Prescriptions   ?None ?  ? ?PDMP not reviewed this encounter. ?  ?Teodora Medici, [place] ?09/23/21 1838 ? ?

## 2021-09-23 NOTE — ED Triage Notes (Signed)
ABD pain . Pt reports partner reported to be positive to STD. ?

## 2021-09-23 NOTE — Discharge Instructions (Addendum)
You are being treated for gonorrhea reinfection with Rocephin again today.  Please refrain from sexual activity for at least 7 days following treatment.  Please be sure that your sexual partner gets evaluated and treated again.  Please use condoms for sexual intercourse.  Pregnancy test was negative.  We will send urine culture to confirm.  your vaginal swab is pending.  We will call if it is positive. ?

## 2021-09-24 ENCOUNTER — Telehealth (HOSPITAL_COMMUNITY): Payer: Self-pay | Admitting: Emergency Medicine

## 2021-09-24 LAB — CERVICOVAGINAL ANCILLARY ONLY
Bacterial Vaginitis (gardnerella): POSITIVE — AB
Candida Glabrata: NEGATIVE
Candida Vaginitis: NEGATIVE
Chlamydia: NEGATIVE
Comment: NEGATIVE
Comment: NEGATIVE
Comment: NEGATIVE
Comment: NEGATIVE
Comment: NEGATIVE
Comment: NORMAL
Neisseria Gonorrhea: NEGATIVE
Trichomonas: NEGATIVE

## 2021-09-24 MED ORDER — METRONIDAZOLE 500 MG PO TABS: 500.0000 mg | ORAL_TABLET | Freq: Two times a day (BID) | ORAL | 0 refills | Status: DC

## 2021-09-25 LAB — URINE CULTURE

## 2021-10-13 ENCOUNTER — Ambulatory Visit: Payer: No Typology Code available for payment source

## 2021-11-12 ENCOUNTER — Ambulatory Visit: Payer: No Typology Code available for payment source

## 2021-12-28 ENCOUNTER — Inpatient Hospital Stay (HOSPITAL_COMMUNITY)
Admission: AD | Admit: 2021-12-28 | Discharge: 2021-12-28 | Disposition: A | Discharge status: Home / Self Care | Payer: Medicaid Other | Attending: Family Medicine | Admitting: Family Medicine

## 2021-12-28 ENCOUNTER — Encounter (HOSPITAL_COMMUNITY): Payer: Self-pay | Admitting: *Deleted

## 2021-12-28 ENCOUNTER — Inpatient Hospital Stay (HOSPITAL_COMMUNITY): Payer: Medicaid Other

## 2021-12-28 DIAGNOSIS — O26891 Other specified pregnancy related conditions, first trimester: Secondary | ICD-10-CM | POA: Diagnosis not present

## 2021-12-28 DIAGNOSIS — Z3491 Encounter for supervision of normal pregnancy, unspecified, first trimester: Secondary | ICD-10-CM

## 2021-12-28 DIAGNOSIS — Z3A01 Less than 8 weeks gestation of pregnancy: Secondary | ICD-10-CM | POA: Insufficient documentation

## 2021-12-28 DIAGNOSIS — O26899 Other specified pregnancy related conditions, unspecified trimester: Secondary | ICD-10-CM

## 2021-12-28 DIAGNOSIS — N760 Acute vaginitis: Secondary | ICD-10-CM | POA: Diagnosis not present

## 2021-12-28 DIAGNOSIS — Z3201 Encounter for pregnancy test, result positive: Secondary | ICD-10-CM | POA: Diagnosis not present

## 2021-12-28 DIAGNOSIS — B9689 Other specified bacterial agents as the cause of diseases classified elsewhere: Secondary | ICD-10-CM | POA: Insufficient documentation

## 2021-12-28 DIAGNOSIS — O99891 Other specified diseases and conditions complicating pregnancy: Secondary | ICD-10-CM | POA: Insufficient documentation

## 2021-12-28 DIAGNOSIS — O2341 Unspecified infection of urinary tract in pregnancy, first trimester: Secondary | ICD-10-CM

## 2021-12-28 HISTORY — DX: Headache, unspecified: R51.9

## 2021-12-28 HISTORY — DX: Unspecified ovarian cyst, unspecified side: N83.209

## 2021-12-28 HISTORY — DX: Urinary tract infection, site not specified: N39.0

## 2021-12-28 LAB — URINALYSIS, MICROSCOPIC (REFLEX)

## 2021-12-28 LAB — POCT PREGNANCY, URINE: Preg Test, Ur: POSITIVE — AB

## 2021-12-28 LAB — URINALYSIS, ROUTINE W REFLEX MICROSCOPIC
Bilirubin Urine: NEGATIVE
Glucose, UA: NEGATIVE mg/dL
Ketones, ur: NEGATIVE mg/dL
Nitrite: POSITIVE — AB
Protein, ur: NEGATIVE mg/dL
Specific Gravity, Urine: 1.025 (ref 1.005–1.030)
pH: 6 (ref 5.0–8.0)

## 2021-12-28 LAB — WET PREP, GENITAL
Sperm: NONE SEEN
Trich, Wet Prep: NONE SEEN
WBC, Wet Prep HPF POC: >=10 — AB (ref <10)
Yeast Wet Prep HPF POC: NONE SEEN

## 2021-12-28 LAB — CBC
HCT: 40.9 % (ref 36.0–46.0)
Hemoglobin: 13.7 g/dL (ref 12.0–15.0)
MCH: 27.7 pg (ref 26.0–34.0)
MCHC: 33.5 g/dL (ref 30.0–36.0)
MCV: 82.8 fL (ref 80.0–100.0)
Platelets: 321 10*3/uL (ref 150–400)
RBC: 4.94 MIL/uL (ref 3.87–5.11)
RDW: 14.7 % (ref 11.5–15.5)
WBC: 10 10*3/uL (ref 4.0–10.5)
nRBC: 0 % (ref 0.0–0.2)

## 2021-12-28 LAB — HCG, QUANTITATIVE, PREGNANCY: hCG, Beta Chain, Quant, S: 616 m[IU]/mL — ABNORMAL HIGH (ref <5)

## 2021-12-28 MED ORDER — METRONIDAZOLE 500 MG PO TABS: 500.0000 mg | ORAL_TABLET | Freq: Two times a day (BID) | ORAL | 0 refills | Status: DC

## 2021-12-28 MED ORDER — CEPHALEXIN 500 MG PO CAPS
500.0000 mg | ORAL_CAPSULE | Freq: Four times a day (QID) | ORAL | 0 refills | Status: DC
Start: 2021-12-28 — End: 2022-01-15

## 2021-12-28 MED ORDER — TERCONAZOLE 0.4 % VA CREA: 1.0000 | TOPICAL_CREAM | Freq: Every day | VAGINAL | 0 refills | Status: DC

## 2021-12-28 NOTE — MAU Note (Signed)
Anita Allen is a 31 y.o. at [facility] here in [facility]U reporting: last wk was feeling really light headed (not as bad), having headaches (hasn't taken anything)and feeling cramping.  Took a preg test last wk, was neg.  +HPT 2 days ago. Still having the cramping. No bleeding.  LMP: 7/24 Onset of complaint: last wk Pain score: mild cramping Vitals:   12/28/21 1052  BP: 126/82  Pulse: 75  Resp: 16  Temp: 98 F (36.7 C)  SpO2: 100%      Lab orders placed from triage:  UPT, UA if +

## 2021-12-28 NOTE — MAU Provider Note (Signed)
History     CSN: [id number]  Arrival date and time: 12/28/21 8242   None     Chief Complaint  Patient presents with   Abdominal Pain   Back Pain   Possible Pregnancy   HPI Anita Allen is a 31 y.o. P5T6144 at [redacted]w[redacted]d by LMP who presents to MAU for positive pregnancy test and abdominal cramping. She reports last week she started feeling groggy, having indigestion and headaches. Took a pregnancy test which was negative. Symptoms continued however started having some mild lower abdominal cramping, so she took another pregnancy test this week which was positive. She denies vaginal bleeding, but reports some vaginal discharge with an odor. No itching. Reports urine has a strong odor as well, but denies burning/pain with urination, frequency, hematuria, flank pain, or fever. She is unsure of LMP. Does not have an OBGYN.  OB History     Gravida  7   Para  3   Term  3   Preterm  0   AB  3   Living  3      SAB  1   IAB  2   Ectopic  0   Multiple  0   Live Births  3           Past Medical History:  Diagnosis Date   BV (bacterial vaginosis)    Gonorrhea contact, treated    H/O dilation and curettage    Headache    Hx of trichomoniasis    Molar pregnancy 12/2013   Ovarian cyst    PCOS (polycystic ovarian syndrome)    UTI (urinary tract infection)    Vaginal delivery 2012, 2013   Yeast infection     Past Surgical History:  Procedure Laterality Date   brazillian butt lift     DILATION AND CURETTAGE OF UTERUS     DILATION AND EVACUATION N/A 12/20/2013   Procedure: DILATATION AND EVACUATION;  Surgeon: Adam Phenix, MD;  Location: [facility];  Service: Gynecology;  Laterality: N/A;   LIPOSUCTION TRUNK      Family History  Problem Relation Age of Onset   Hypertension Mother    Heart attack Mother    Thyroid disease Mother    Heart disease Father    Hypertension Father    Heart attack Father     Social History   Tobacco Use   Smoking status: Never    Smokeless tobacco: Never  Vaping Use   Vaping Use: Never used  Substance Use Topics   Alcohol use: Yes    Comment: not often   Drug use: No    Allergies:  Allergies  Allergen Reactions   Augmentin [Amoxicillin-Pot Clavulanate] Hives and Other (See Comments)    Pt has taken Ceftriaxone in the past (??)   Latex Rash    Vaginal irritation with condoms    No medications prior to admission.    Review of Systems  Constitutional: Negative.   Respiratory: Negative.    Gastrointestinal:  Positive for abdominal pain (cramping).  Genitourinary:  Positive for vaginal discharge. Negative for dysuria, frequency and hematuria.  Musculoskeletal: Negative.   Neurological: Negative.    Physical Exam   Blood pressure 127/84, pulse 78, temperature 98 F (36.7 C), temperature source Oral, resp. rate 16, height 5\' 1"  (1.549 m), weight 84 kg, last menstrual period 12/01/2021, SpO2 100 %.  Physical Exam Vitals and nursing note reviewed.  Constitutional:      General: She is not in acute distress. Cardiovascular:  Rate and Rhythm: Normal rate.  Pulmonary:     Effort: Pulmonary effort is normal.  Abdominal:     Palpations: Abdomen is soft.     Tenderness: There is no abdominal tenderness. There is no right CVA tenderness or left CVA tenderness.  Genitourinary:    Comments: Patient self swabbed Skin:    General: Skin is warm and dry.  Neurological:     General: No focal deficit present.     Mental Status: She is alert and oriented to person, place, and time.  Psychiatric:        Mood and Affect: Mood normal.        Behavior: Behavior normal.    US OB LESS THAN 14 WEEKS WITH OB TRANSVAGINAL  Result Date: 12/28/2021 CLINICAL DATA:  Cramping. Quantitative beta HCG pending. Estimated gestational age per LMP 3 weeks 6 days, although not certain. EXAM: OBSTETRIC <14 WK [place] AND TRANSVAGINAL OB US TECHNIQUE: Both transabdominal and transvaginal ultrasound examinations were performed for  complete evaluation of the gestation as well as the maternal uterus, adnexal regions, and pelvic cul-de-sac. Transvaginal technique was performed to assess early pregnancy. COMPARISON:  None Available. FINDINGS: Intrauterine gestational sac: Possible single tiny gestational sac present. Yolk sac:  Not visualized. Embryo:  Not visualized. Cardiac Activity: Not visualized. Heart Rate: Not visualized. MSD: 2.1 mm   4 w   6 d Subchorionic hemorrhage:  None visualized. Maternal uterus/adnexae: Ovaries are normal size, shape and position with normal color Doppler. Small amount of free pelvic fluid. IMPRESSION: Possible tiny gestational sac with mean sac diameter compatible with estimated gestational age of [redacted] weeks 6 days. No yolk sac or embryo visualized. No adnexal abnormality. Findings may be due to normal early pregnancy. Recommend clinical correlation with patient's quantitative beta HCG and serial follow-up quantitative HCG with follow-up ultrasound 10-14 days. Electronically Signed   By: Elberta Fortis M.D.   On: 12/28/2021 11:45    MAU Course  Procedures  MDM UA positive hgb and nitrites, urine culture pending CBC, HCG stable. Blood type A positive, Rhogam not indicated Wet prep positive for clue cells, GC/CT pending Ultrasound shows gestational sac without yolk sac or fetal pole Will empirically treat UTI- reviewed patient with pharmacy regarding patient allergy to Augmentin; patient reports childhood allergy and no recent allergic reactions- appears patient has take keflex in past as well as ceftriaxone. Pharmacy recommends keflex or cefpodoxime. Given patient has taken keflex previously, will send to pharmacy. I also reviewed that if patient has allergic reaction to immediately stop taking and report to ED. Will also send flagyl to pharmacy- patient prefers pills over gel. Patient requesting terazol be sent as she is prone to yeast infections after antibiotic use. Viability ultrasound scheduled in  approximately 10-14 days Strict return precautions reviewed at length with patient  Assessment and Plan  IUP UTI in pregnancy Bacterial vaginosis Abdominal pain affecting pregnancy  - Discharge home in stable condition - Rx's sent to pharmacy - Strict return precautions. Return to MAU as needed for new/worsening symptoms - List of OBGYN's and safe meds provided    Brand Males, CNM 12/28/2021, 1:33 PM

## 2021-12-28 NOTE — Discharge Instructions (Signed)
[facility]iders   [facility]             [address], [place], [place] [address] [phone]  [facility]                                                             [address], [address], [place], [place], [address] [phone]  [facility]                                    [address], [address], [place], [place], [address] [phone]  [facility] [place] [address], [place], [place], [address] [phone]  [facility]                                 [address], [place], [place], [address] [phone]  [facility]                                    [address], [place], [place], [address] [phone]  [facility] [address], [address], [place], [place], [address]                              [facility] [address], [address], [place], , [address] [phone]  [facility]         Phone: [phone]  [facility]      Phone: [phone]   [facility]      Phone: [phone]  [facility]         Phone: [phone]   [facility]    Phone: [phone]  [facility]      Phone: [phone]  [facility]      Phone: [phone]  [facility]        Phone: [phone]  [facility]      Phone: [phone]                   Safe Medications in Pregnancy    Acne: Benzoyl Peroxide Salicylic Acid  Backache/Headache: Tylenol: 2 regular strength every 4 hours OR              2 Extra strength every 6  hours  Colds/Coughs/Allergies: Benadryl (alcohol free) 25 mg every 6 hours as needed Breath right strips Claritin Cepacol throat lozenges Chloraseptic throat spray Cold-Eeze- up to three times per day Cough drops, alcohol free Flonase (by prescription only) Guaifenesin Mucinex Robitussin DM (plain only, alcohol free) Saline nasal spray/drops Sudafed (pseudoephedrine) & Actifed ** use only after [redacted] weeks gestation and if you do not have high blood pressure Tylenol Vicks Vaporub Zinc lozenges Zyrtec   Constipation: Colace Ducolax suppositories Fleet enema Glycerin   suppositories Metamucil Milk of magnesia Miralax Senokot Smooth move tea  Diarrhea: Kaopectate Imodium A-D  *NO pepto Bismol  Hemorrhoids: Anusol Anusol HC Preparation H Tucks  Indigestion: Tums Maalox Mylanta Zantac  Pepcid  Insomnia: Benadryl (alcohol free) 25mg every 6 hours as needed Tylenol PM Unisom, no Gelcaps  Leg Cramps: Tums MagGel  Nausea/Vomiting:  Bonine Dramamine Emetrol Ginger extract Sea bands Meclizine  Nausea medication to take during pregnancy:  Unisom (doxylamine succinate 25 mg tablets) Take one tablet daily at bedtime. If symptoms are not adequately controlled, the dose can be increased to a maximum recommended dose of two tablets daily (1/2 tablet in the morning, 1/2 tablet mid-afternoon and one at bedtime). Vitamin B6 100mg tablets. Take one tablet twice a day (up to 200 mg per day).  Skin Rashes: Aveeno products Benadryl cream or 25mg every 6 hours as needed Calamine Lotion 1% cortisone cream  Yeast infection: Gyne-lotrimin 7 Monistat 7   **If taking multiple medications, please check labels to avoid duplicating the same active ingredients **take medication as directed on the label ** Do not exceed 4000 mg of tylenol in 24 hours **Do not take medications that contain aspirin or ibuprofen    

## 2021-12-29 LAB — GC/CHLAMYDIA PROBE AMP (~~LOC~~) NOT AT ARMC
Chlamydia: NEGATIVE
Comment: NEGATIVE
Comment: NORMAL
Neisseria Gonorrhea: NEGATIVE

## 2021-12-30 LAB — CULTURE, OB URINE: Culture: >=100000 — AB

## 2022-01-08 ENCOUNTER — Ambulatory Visit
Admission: RE | Admit: 2022-01-08 | Discharge: 2022-01-08 | Disposition: A | Discharge status: Home / Self Care | Payer: Medicaid Other | Source: Ambulatory Visit

## 2022-01-08 ENCOUNTER — Ambulatory Visit (INDEPENDENT_AMBULATORY_CARE_PROVIDER_SITE_OTHER): Payer: Self-pay | Admitting: *Deleted

## 2022-01-08 VITALS — BP 119/77 | HR 70 | Wt 187.6 lb

## 2022-01-08 DIAGNOSIS — Z3A01 Less than 8 weeks gestation of pregnancy: Secondary | ICD-10-CM | POA: Insufficient documentation

## 2022-01-08 DIAGNOSIS — Z3491 Encounter for supervision of normal pregnancy, unspecified, first trimester: Secondary | ICD-10-CM | POA: Insufficient documentation

## 2022-01-08 DIAGNOSIS — O3680X0 Pregnancy with inconclusive fetal viability, not applicable or unspecified: Secondary | ICD-10-CM

## 2022-01-08 NOTE — Progress Notes (Signed)
Here for results of [place]. Reviewed [place] with Dr. Alvester Morin and informed patient Korea does not confirm pregnancy and we recommend another Korea in 10-14 days. Appointment made for 01/22/22 0845. Advised if severe pain or bleeding to go to MAU. She voices understanding. Nancy Fetter

## 2022-01-15 ENCOUNTER — Encounter (HOSPITAL_COMMUNITY): Payer: Self-pay | Admitting: Obstetrics and Gynecology

## 2022-01-15 ENCOUNTER — Inpatient Hospital Stay (HOSPITAL_COMMUNITY)
Admission: AD | Admit: 2022-01-15 | Discharge: 2022-01-15 | Disposition: A | Discharge status: Home / Self Care | Payer: Medicaid Other | Attending: Obstetrics and Gynecology | Admitting: Obstetrics and Gynecology

## 2022-01-15 DIAGNOSIS — Z3A01 Less than 8 weeks gestation of pregnancy: Secondary | ICD-10-CM | POA: Insufficient documentation

## 2022-01-15 DIAGNOSIS — R6881 Early satiety: Secondary | ICD-10-CM | POA: Insufficient documentation

## 2022-01-15 DIAGNOSIS — K589 Irritable bowel syndrome without diarrhea: Secondary | ICD-10-CM | POA: Insufficient documentation

## 2022-01-15 DIAGNOSIS — O99611 Diseases of the digestive system complicating pregnancy, first trimester: Secondary | ICD-10-CM | POA: Diagnosis not present

## 2022-01-15 DIAGNOSIS — K5901 Slow transit constipation: Secondary | ICD-10-CM

## 2022-01-15 DIAGNOSIS — O26891 Other specified pregnancy related conditions, first trimester: Secondary | ICD-10-CM | POA: Insufficient documentation

## 2022-01-15 DIAGNOSIS — O99891 Other specified diseases and conditions complicating pregnancy: Secondary | ICD-10-CM | POA: Insufficient documentation

## 2022-01-15 DIAGNOSIS — R110 Nausea: Secondary | ICD-10-CM | POA: Insufficient documentation

## 2022-01-15 LAB — URINALYSIS, ROUTINE W REFLEX MICROSCOPIC
Bilirubin Urine: NEGATIVE
Glucose, UA: NEGATIVE mg/dL
Hgb urine dipstick: NEGATIVE
Ketones, ur: NEGATIVE mg/dL
Leukocytes,Ua: NEGATIVE
Nitrite: NEGATIVE
Protein, ur: NEGATIVE mg/dL
Specific Gravity, Urine: 1.017 (ref 1.005–1.030)
pH: 7 (ref 5.0–8.0)

## 2022-01-15 MED ORDER — METOCLOPRAMIDE HCL 10 MG PO TABS: 10.0000 mg | ORAL_TABLET | Freq: Four times a day (QID) | ORAL | 0 refills | Status: DC

## 2022-01-15 MED ORDER — METOCLOPRAMIDE HCL 10 MG PO TABS
10.0000 mg | ORAL_TABLET | Freq: Once | ORAL | Status: AC
  Administered 2022-01-15: 10 mg via ORAL
  Filled 2022-01-15: qty 1

## 2022-01-15 MED ORDER — SENNOSIDES-DOCUSATE SODIUM 8.6-50 MG PO TABS: 1.0000 | ORAL_TABLET | Freq: Every day | ORAL | 5 refills | Status: DC

## 2022-01-15 MED ORDER — PANTOPRAZOLE SODIUM 40 MG PO TBEC
40.0000 mg | DELAYED_RELEASE_TABLET | Freq: Once | ORAL | Status: DC
  Filled 2022-01-15: qty 1

## 2022-01-15 MED ORDER — SORBITOL 70 % SOLN
960.0000 mL | TOPICAL_OIL | Freq: Once | ORAL | Status: AC
  Administered 2022-01-15: 960 mL via RECTAL
  Filled 2022-01-15: qty 473

## 2022-01-15 MED ORDER — POLYETHYLENE GLYCOL 3350 17 GM/SCOOP PO POWD: 17.0000 g | Freq: Every day | ORAL | 1 refills | Status: DC | PRN

## 2022-01-15 MED ORDER — CALCIUM CARBONATE ANTACID 500 MG PO CHEW: 800.0000 mg | CHEWABLE_TABLET | Freq: Once | ORAL | Status: DC

## 2022-01-15 NOTE — MAU Provider Note (Signed)
History     CSN: [id number]  Arrival date and time: 01/15/22 4166   None     Chief Complaint  Patient presents with   Abdominal Pain   Back Pain   Constipation   Gastroesophageal Reflux   Anita Allen is a 32 G76P3 female who is 6 weeks and 3 days along. She has been experiencing intermittent stomach cramping, constipation and bloating with nausea for the last 3 days. Her last bowel movement was 3 days ago and described as hard, no blood present. She does have a history of constipation issues and has tried miralax along with IBS medications with no relief. She has previously received a suppository with relief of symptoms. Her indigestion symptoms have been present since she found out she was pregnant, this was a common symptom during her other pregnancies as well. She works over night and avoids eating because of her symptoms.  Last [place] (01/08/2022) with findings suspicious for failed pregnancy, she has a repeat scheduled for 01/22/22. Denies any vaginal bleeding or discharge.     OB History     Gravida  7   Para  3   Term  3   Preterm  0   AB  3   Living  3      SAB  1   IAB  2   Ectopic  0   Multiple  0   Live Births  3           Past Medical History:  Diagnosis Date   BV (bacterial vaginosis)    Gonorrhea contact, treated    H/O dilation and curettage    Headache    Hx of trichomoniasis    Molar pregnancy 12/2013   Ovarian cyst    PCOS (polycystic ovarian syndrome)    UTI (urinary tract infection)    Vaginal delivery 2012, 2013   Yeast infection     Past Surgical History:  Procedure Laterality Date   brazillian butt lift     DILATION AND CURETTAGE OF UTERUS     DILATION AND EVACUATION N/A 12/20/2013   Procedure: DILATATION AND EVACUATION;  Surgeon: Adam Phenix, MD;  Location: [facility];  Service: Gynecology;  Laterality: N/A;   LIPOSUCTION TRUNK      Family History  Problem Relation Age of Onset   Hypertension Mother    Heart attack Mother     Thyroid disease Mother    Heart disease Father    Hypertension Father    Heart attack Father     Social History   Tobacco Use   Smoking status: Never   Smokeless tobacco: Never  Vaping Use   Vaping Use: Never used  Substance Use Topics   Alcohol use: Yes    Comment: not often   Drug use: No    Allergies:  Allergies  Allergen Reactions   Augmentin [Amoxicillin-Pot Clavulanate] Hives and Other (See Comments)    Pt has taken Ceftriaxone in the past (??)   Latex Rash    Vaginal irritation with condoms    Medications Prior to Admission  Medication Sig Dispense Refill Last Dose   Prenatal Vit-Fe Fumarate-FA (PRENATAL VITAMINS) 28-0.8 MG TABS Take by mouth.   01/14/2022   Biotin 100 MG/GM POWD Take by mouth.      cephALEXin (KEFLEX) 500 MG capsule Take 1 capsule (500 mg total) by mouth 4 (four) times daily. 20 capsule 0    metroNIDAZOLE (FLAGYL) 500 MG tablet Take 1 tablet (500 mg total) by  mouth 2 (two) times daily. 14 tablet 0    terconazole (TERAZOL 7) 0.4 % vaginal cream Place 1 applicator vaginally at bedtime. Use for seven days 45 g 0     Review of Systems  Constitutional:  Positive for appetite change.  Gastrointestinal:  Positive for abdominal distention, abdominal pain, constipation and nausea. Negative for anal bleeding, blood in stool and vomiting.  Genitourinary:  Negative for difficulty urinating, dyspareunia, hematuria, vaginal bleeding and vaginal discharge.   Physical Exam   Blood pressure 128/71, pulse 73, temperature 98.3 F (36.8 C), temperature source Oral, resp. rate 16, height 5\' 1"  (1.549 m), weight 86.2 kg, last menstrual period 12/01/2021, SpO2 100 %.  Physical Exam Constitutional:      Appearance: Normal appearance.  HENT:     Head: Normocephalic.  Cardiovascular:     Rate and Rhythm: Normal rate and regular rhythm.  Abdominal:     Palpations: Abdomen is soft.     Tenderness: There is abdominal tenderness in the right lower quadrant,  suprapubic area and left lower quadrant. There is no right CVA tenderness, left CVA tenderness or rebound.  Skin:    General: Skin is warm.  Neurological:     Mental Status: She is alert and oriented to person, place, and time.  Psychiatric:        Mood and Affect: Mood normal.        Behavior: Behavior normal.     MAU Course  Procedures  MDM Moderate  Assessment and Plan  Constipation  - Given SMOG enema. Massive BM, feels much better. Discussed at home treatments and instructions of use. Recommend miralax along with senna to aid in any peristalsis issues.   Early satiety/sensation of fullness/nausea - Symptoms likely related to chronic constipation, low suspicion for gastroparesis as she does not have a hx of DM.  - Will start patient on Reglan 10 mg PO PRN for symptoms, though hopefully will improve with better bowel regimen  Intrauterine pregnancy, suspected failed pregnancy - Patient has upcoming transvaginal 12/03/2021 scheduled for 01/22/22. Recommend she keep this.   01/24/22 01/15/2022, 10:12 AM    ATTENDING ATTESTATION  I have seen and examined this patient and agree with the above documentation in the PA student's note except as below.  I have edited the above note for accuracy and clarity  03/17/2022, MD/[facility] ([facility]) 01/15/2022, 1:14 PM

## 2022-01-15 NOTE — MAU Note (Signed)
Anita Allen is a 31 y.o. at [redacted]w[redacted]d here in MAU reporting: is constipated (not taken anything, last BM was 3 days ago),  having indigestion- which is increasing the nausea and has been cramping. No bleeding Not taken anything Onset of complaint: 3 days ago Pain score: mild Vitals:   01/15/22 0908  BP: 129/68  Pulse: 82  Resp: 16  Temp: 98.3 F (36.8 C)  SpO2: 100%     Lab orders placed from triage:  urine

## 2022-01-22 ENCOUNTER — Ambulatory Visit
Admission: RE | Admit: 2022-01-22 | Discharge: 2022-01-22 | Disposition: A | Discharge status: Home / Self Care | Payer: Medicaid Other | Source: Ambulatory Visit | Attending: Family Medicine | Admitting: Family Medicine

## 2022-01-22 DIAGNOSIS — O3680X0 Pregnancy with inconclusive fetal viability, not applicable or unspecified: Secondary | ICD-10-CM | POA: Diagnosis not present

## 2022-02-08 DIAGNOSIS — Z419 Encounter for procedure for purposes other than remedying health state, unspecified: Secondary | ICD-10-CM | POA: Diagnosis not present

## 2022-02-12 ENCOUNTER — Ambulatory Visit: Payer: No Typology Code available for payment source

## 2022-02-19 ENCOUNTER — Ambulatory Visit (INDEPENDENT_AMBULATORY_CARE_PROVIDER_SITE_OTHER): Payer: Medicaid Other

## 2022-02-19 DIAGNOSIS — Z3A11 11 weeks gestation of pregnancy: Secondary | ICD-10-CM

## 2022-02-19 DIAGNOSIS — Z3481 Encounter for supervision of other normal pregnancy, first trimester: Secondary | ICD-10-CM

## 2022-02-19 DIAGNOSIS — Z3480 Encounter for supervision of other normal pregnancy, unspecified trimester: Secondary | ICD-10-CM | POA: Insufficient documentation

## 2022-02-19 MED ORDER — GOJJI WEIGHT SCALE MISC: 1.0000 | 0 refills | Status: DC

## 2022-02-19 MED ORDER — BLOOD PRESSURE KIT DEVI: 1.0000 | 0 refills | Status: DC

## 2022-02-19 NOTE — Progress Notes (Signed)
New OB Intake  I connected with  Anita Allen on 02/19/22 at  9:15 AM EDT by telephone Video Visit and verified that I am speaking with the correct person using two identifiers. Nurse is located at [facility] and pt is located at Home.  I discussed the limitations, risks, security and privacy concerns of performing an evaluation and management service by telephone and the availability of in person appointments. I also discussed with the patient that there may be a patient responsible charge related to this service. The patient expressed understanding and agreed to proceed.  I explained I am completing New OB Intake today. We discussed her EDD of 09/07/22 that is based on LMP of 12/01/21. Pt is G7/P3033. I reviewed her allergies, medications, Medical/Surgical/OB history, and appropriate screenings. I informed her of [facility] services. [facility] information placed in AVS. Based on history, this is a/an  pregnancy uncomplicated .   Patient Active Problem List   Diagnosis Date Noted   Encounter for insertion of copper IUD 07/02/2021   LGSIL on Pap smear of cervix 06/23/2021   Difficulty maintaining weight 06/19/2021   Constipation 06/19/2021   Rash and nonspecific skin eruption 06/19/2021   PCOS (polycystic ovarian syndrome) 06/19/2021   Abnormal uterine bleeding (AUB) 06/13/2021    Concerns addressed today  Delivery Plans Plans to deliver at [facility]. Patient given information for [facility] Healthy Baby website for more information about Women's and [place]. Patient is not interested in water birth. Offered upcoming OB visit with CNM to discuss further.  MyChart/Babyscripts MyChart access verified. I explained pt will have some visits in office and some virtually. Babyscripts instructions given and order placed. Patient verifies receipt of registration text/e-mail. Account successfully created and app downloaded.  Blood Pressure Cuff/Weight Scale Blood pressure cuff ordered for patient to  pick-up from [employer]. Explained after first prenatal appt pt will check weekly and document in 78. Patient does not  have weight scale. Weight scale ordered for patient to pick up from [employer].   Anatomy US Explained first scheduled Korea will be around 19 weeks. Anatomy US to be ordered at NEW OB provider visit. Date TBD.   Labs Discussed Johnsie Cancel genetic screening with patient. Would like both Panorama and Horizon drawn at new OB visit. Routine prenatal labs needed.  Covid Vaccine Patient has covid vaccine.   Is patient a CenteringPregnancy candidate?  Declined Declined due to Group Setting Not a candidate due to  Centering Patient" indicated on sticky note  Social Determinants of Health Food Insecurity: Patient denies food insecurity. WIC Referral: Patient is interested in referral to [facility].  Transportation: Patient denies transportation needs. Childcare: Discussed no children allowed at ultrasound appointments. Offered childcare services; patient declines childcare services at this time.  First visit review I reviewed new OB appt with pt. I explained she will have a provider visit that includes prenatal labs, pap smear, std screening, genetic screening, and discuss plan of care for pregnancy. Explained pt will be seen by Laury Deep at first visit; encounter routed to appropriate provider. Explained that patient will be seen by pregnancy navigator following visit with provider.   Lucianne Lei, RN 02/19/2022  9:07 AM

## 2022-02-26 ENCOUNTER — Other Ambulatory Visit (HOSPITAL_COMMUNITY)
Admission: RE | Admit: 2022-02-26 | Discharge: 2022-02-26 | Disposition: A | Discharge status: Home / Self Care | Payer: Medicaid Other | Source: Ambulatory Visit | Attending: Obstetrics and Gynecology | Admitting: Obstetrics and Gynecology

## 2022-02-26 ENCOUNTER — Encounter: Payer: Self-pay | Admitting: Obstetrics and Gynecology

## 2022-02-26 ENCOUNTER — Inpatient Hospital Stay (HOSPITAL_COMMUNITY)
Admission: AD | Admit: 2022-02-26 | Discharge: 2022-02-26 | Disposition: A | Discharge status: Home / Self Care | Payer: Medicaid Other | Attending: Obstetrics and Gynecology | Admitting: Obstetrics and Gynecology

## 2022-02-26 ENCOUNTER — Ambulatory Visit (INDEPENDENT_AMBULATORY_CARE_PROVIDER_SITE_OTHER): Payer: Medicaid Other | Admitting: Obstetrics and Gynecology

## 2022-02-26 ENCOUNTER — Encounter (HOSPITAL_COMMUNITY): Payer: Self-pay | Admitting: Obstetrics and Gynecology

## 2022-02-26 VITALS — BP 123/80 | HR 89 | Wt 194.0 lb

## 2022-02-26 DIAGNOSIS — Z3481 Encounter for supervision of other normal pregnancy, first trimester: Secondary | ICD-10-CM | POA: Diagnosis not present

## 2022-02-26 DIAGNOSIS — Z3A12 12 weeks gestation of pregnancy: Secondary | ICD-10-CM | POA: Insufficient documentation

## 2022-02-26 DIAGNOSIS — O2621 Pregnancy care for patient with recurrent pregnancy loss, first trimester: Secondary | ICD-10-CM | POA: Insufficient documentation

## 2022-02-26 DIAGNOSIS — O4441 Low lying placenta NOS or without hemorrhage, first trimester: Secondary | ICD-10-CM | POA: Diagnosis not present

## 2022-02-26 DIAGNOSIS — Z3480 Encounter for supervision of other normal pregnancy, unspecified trimester: Secondary | ICD-10-CM | POA: Diagnosis not present

## 2022-02-26 DIAGNOSIS — O99210 Obesity complicating pregnancy, unspecified trimester: Secondary | ICD-10-CM | POA: Insufficient documentation

## 2022-02-26 DIAGNOSIS — O209 Hemorrhage in early pregnancy, unspecified: Secondary | ICD-10-CM | POA: Diagnosis not present

## 2022-02-26 DIAGNOSIS — O09291 Supervision of pregnancy with other poor reproductive or obstetric history, first trimester: Secondary | ICD-10-CM | POA: Diagnosis not present

## 2022-02-26 DIAGNOSIS — O99211 Obesity complicating pregnancy, first trimester: Secondary | ICD-10-CM

## 2022-02-26 LAB — URINALYSIS, ROUTINE W REFLEX MICROSCOPIC
Bilirubin Urine: NEGATIVE
Glucose, UA: NEGATIVE mg/dL
Ketones, ur: NEGATIVE mg/dL
Leukocytes,Ua: NEGATIVE
Nitrite: NEGATIVE
Protein, ur: NEGATIVE mg/dL
Specific Gravity, Urine: 1.013 (ref 1.005–1.030)
pH: 7 (ref 5.0–8.0)

## 2022-02-26 MED ORDER — ASPIRIN 81 MG PO CHEW: 81.0000 mg | CHEWABLE_TABLET | Freq: Every day | ORAL | 6 refills | Status: DC

## 2022-02-26 MED ORDER — POLYETHYLENE GLYCOL 3350 17 GM/SCOOP PO POWD: 17.0000 g | Freq: Every day | ORAL | 11 refills | Status: DC | PRN

## 2022-02-26 NOTE — MAU Note (Signed)
Pt initially placed into room 129 but had 3 children in the lobby. Pt back out to the lobby to be with her children until childcare arrives.

## 2022-02-26 NOTE — Progress Notes (Signed)
INITIAL OBSTETRICAL VISIT Patient name: Anita Allen MRN [id number]  Date of birth: 03-Jul-1990 Chief Complaint:   Initial Prenatal Visit  History of Present Illness:   Anita Allen is a 31 y.o. [id number] African American female at [redacted]w[redacted]d by LMP with an Estimated Date of Delivery: 09/07/22 being seen today for her initial obstetrical visit.  Her obstetrical history is significant for obesity. This is a planned pregnancy. She and the father of the baby (FOB) are involved. She has a support system that consists of FOB/ her family/friends. Today she reports she was in MAU this morning for VB and  constipation .   Patient's last menstrual period was 12/01/2021 (approximate). Last pap 06/13/2021. Results were: abnormal  LSIL with HRHPV Review of Systems:   Pertinent items are noted in HPI Denies cramping/contractions, leakage of fluid, vaginal bleeding, abnormal vaginal discharge w/ itching/odor/irritation, headaches, visual changes, shortness of breath, chest pain, abdominal pain, severe nausea/vomiting, or problems with urination or bowel movements unless otherwise stated above.  Pertinent History Reviewed:  Reviewed past medical,surgical, social, obstetrical and family history.  Reviewed problem list, medications and allergies. OB History  Gravida Para Term Preterm AB Living  7 3 3  0 3 3  SAB IAB Ectopic Multiple Live Births  1 2 0 0 3    # Outcome Date GA Lbr Len/2nd Weight Sex Delivery Anes PTL Lv  7 Current           6 Term 01/17/15 [redacted]w[redacted]d 08:29 / 00:26 6 lb 14.9 oz (3.145 kg) M Vag-Spont EPI  LIV  5 SAB 2015             Birth Comments: molar pregnancy  4 Term 02/24/12 [redacted]w[redacted]d / 01:03  M Vag-Spont EPI  LIV  3 Term 10/16/10 [redacted]w[redacted]d   F Vag-Spont   LIV  2 IAB 2010 [redacted]w[redacted]d         1 IAB            Physical Assessment:   Vitals:   02/26/22 1104  BP: 123/80  Pulse: 89  Weight: 194 lb (88 kg)  Body mass index is 36.66 kg/m.       Physical Examination:  General appearance - well  appearing, and in no distress  Mental status - alert, oriented to person, place, and time  Psych:  She has a normal mood and affect  Skin - warm and dry, normal color, no suspicious lesions noted  Chest - effort normal, all lung fields clear to auscultation bilaterally  Heart - normal rate and regular rhythm  Abdomen - soft, nontender  Extremities:  No swelling or varicosities noted  Pelvic - deferred  FHTs by doppler: 159 bpm  Assessment & Plan:  1) Low-Risk Pregnancy [id number] at [redacted]w[redacted]d with an Estimated Date of Delivery: 09/07/22   2) Initial OB visit - Welcomed to practice and introduced self to patient in addition to discussing other advanced practice providers that she may be seeing at this practice - Congratulated patient - Anticipatory guidance on upcoming appointments - Educated on [place] and pregnancy and the integration of virtual appointments  - Educated on babyscripts app- patient reports she has not received email, encouraged to look in spam folder and to call office if she still has not received email - patient verbalizes understanding    3) Supervision of other normal pregnancy, antepartum - CBC/D/Plt+RPR+Rh+ABO+RubIgG... - Culture, OB Urine - HORIZON CUSTOM - PANORAMA PRENATAL TEST FULL PANEL - Cervicovaginal ancillary only    Meds:  Meds ordered this encounter  Medications   aspirin 81 MG chewable tablet    Sig: Chew 1 tablet (81 mg total) by mouth daily.    Dispense:  30 tablet    Refill:  6    Order Specific Question:   Supervising Provider    Answer:   Reva Bores [[id number]]    Initial labs obtained Continue prenatal vitamins Reviewed n/v relief measures and warning s/s to report Reviewed recommended weight gain based on pre-gravid BMI Encouraged well-balanced diet Genetic Screening discussed: ordered Cystic fibrosis, SMA, Fragile X screening discussed ordered The nature of  - [facility] with multiple MDs and other  Advanced Practice Providers was explained to patient; also emphasized that residents, students are part of our team.  Discussed optimized OB schedule and video visits. Advised can have an in-office visit whenever she feels she needs to be seen.  Does not have own BP cuff. BP cuff Rx faxed today. Explained to patient that BP will be mailed to her house. Check BP weekly, report BP >140/90. Advised to call during normal business hours and there is an after-hours nurse line available.   Indications for ASA therapy (per uptodate) One of the following: Previous pregnancy with preeclampsia, especially early onset and with an adverse outcome? No Multifetal gestation? No Chronic hypertension? No Type 1 or 2 diabetes mellitus? No Chronic kidney disease? No Autoimmune disease (antiphospholipid syndrome, systemic lupus erythematosus)? No   Two or more of the following: Nulliparity? No Obesity (body mass index >30 kg/m2)? Yes Family history of preeclampsia in mother or sister? No Age ?35 years? No Sociodemographic characteristics (African American race, low socioeconomic level)? Yes Personal risk factors (eg, previous pregnancy with low birth weight or small for gestational age infant, previous adverse pregnancy outcome [eg, stillbirth], interval >10 years between pregnancies)? No    Follow-up: Return in about 4 weeks (around 03/26/2022) for Return OB visit.   Orders Placed This Encounter  Procedures   Culture, OB Urine   CBC/D/Plt+RPR+Rh+ABO+RubIgG...   HORIZON CUSTOM   PANORAMA PRENATAL TEST FULL PANEL    Raelyn Mora MSN, CNM 02/26/2022

## 2022-02-26 NOTE — MAU Note (Signed)
..  Anita Allen is a 31 y.o. at [redacted]w[redacted]d here in MAU reporting: vaginal bleeding that began around 5:30am, bright red when she wiped. Denies cramping.   Pain score: 0/10 Vitals:   02/26/22 0628  BP: 126/73  Pulse: 80  Resp: 16  Temp: 98.1 F (36.7 C)  SpO2: 99%     FHT: 161 Lab orders placed from triage:  UA

## 2022-02-26 NOTE — MAU Provider Note (Signed)
History     [id number]  Arrival date and time: 02/26/22 1025    Chief Complaint  Patient presents with   Vaginal Bleeding     HPI Anita Allen is a 31 y.o. at 74w3dby 7 wk [place] PMHx notable for three prior NSVD, who presents for vaginal bleeding.   Patient last had intercourse last night Woke up this morning to use the bathroom and had bright red blood when she wiped Since arrival to MAU has just had pink discharge when using the bathroom Currently feels fine, no abdominal pain      OB History     Gravida  7   Para  3   Term  3   Preterm  0   AB  3   Living  3      SAB  1   IAB  2   Ectopic  0   Multiple  0   Live Births  3           Past Medical History:  Diagnosis Date   BV (bacterial vaginosis)    Gonorrhea contact, treated    H/O dilation and curettage    Headache    Hx of trichomoniasis    Molar pregnancy 12/2013   Ovarian cyst    PCOS (polycystic ovarian syndrome)    UTI (urinary tract infection)    Vaginal delivery 2012, 2013   Yeast infection     Past Surgical History:  Procedure Laterality Date   brazillian butt lift     DILATION AND CURETTAGE OF UTERUS     DILATION AND EVACUATION N/A 12/20/2013   Procedure: DILATATION AND EVACUATION;  Surgeon: JWoodroe Mode MD;  [facility];  Service: Gynecology;  Laterality: N/A;   LIPOSUCTION TRUNK  2021    Family History  Problem Relation Age of Onset   Hypertension Mother    Heart attack Mother    Thyroid disease Mother    Heart disease Father    Hypertension Father    Heart attack Father     Social History   Socioeconomic History   Marital status: Legally Separated    Spouse name: Not on file   Number of children: Not on file   Years of education: Not on file   Highest education level: Not on file  Occupational History   Not on file  Tobacco Use   Smoking status: Never   Smokeless tobacco: Never  Vaping Use   Vaping Use: Never used  Substance and  Sexual Activity   Alcohol use: Not Currently    Comment: not often, not since confirmed pregnancy   Drug use: No   Sexual activity: Yes    Partners: Male    Birth control/protection: None    Comment: Currently pregnant  Other Topics Concern   Not on file  Social History Narrative   ** Merged History Encounter **       Social Determinants of Health   Financial Resource Strain: Not on file  Food Insecurity: Not on file  Transportation Needs: Not on file  Physical Activity: Not on file  Stress: Not on file  Social Connections: Not on file  Intimate Partner Violence: Not on file    Allergies  Allergen Reactions   Augmentin [Amoxicillin-Pot Clavulanate] Hives and Other (See Comments)    Pt has taken Ceftriaxone in the past (??)   Latex Rash    Vaginal irritation with condoms    No current facility-administered medications on  file prior to encounter.   Current Outpatient Medications on File Prior to Encounter  Medication Sig Dispense Refill   Biotin 100 MG/GM POWD Take by mouth. (Patient not taking: Reported on 02/19/2022)     Blood Pressure Monitoring (BLOOD PRESSURE KIT) DEVI 1 kit by Does not apply route once a week. 1 each 0   metoCLOPramide (REGLAN) 10 MG tablet Take 1 tablet (10 mg total) by mouth every 6 (six) hours. (Patient not taking: Reported on 02/19/2022) 30 tablet 0   Misc. Devices (GOJJI WEIGHT SCALE) MISC 1 Device by Does not apply route every 30 (thirty) days. 1 each 0   Prenatal Vit-Fe Fumarate-FA (PRENATAL VITAMINS) 28-0.8 MG TABS Take by mouth. (Patient not taking: Reported on 02/19/2022)     senna-docusate (SENOKOT-S) 8.6-50 MG tablet Take 1 tablet by mouth daily. (Patient not taking: Reported on 02/19/2022) 30 tablet 5     ROS Pertinent positives and negative per HPI, all others reviewed and negative  Physical Exam   BP 126/73 (BP Location: Right Arm)   Pulse 80   Temp 98.1 F (36.7 C) (Oral)   Resp 16   Ht '5\' 1"'  (1.549 m)   Wt 88.1 kg   LMP  12/01/2021 (Approximate)   SpO2 99%   BMI 36.71 kg/m   Patient Vitals for the past 24 hrs:  BP Temp Temp src Pulse Resp SpO2 Height Weight  02/26/22 0628 126/73 98.1 F (36.7 C) Oral 80 16 99 % '5\' 1"'  (1.549 m) 88.1 kg    Physical Exam Vitals reviewed.  Constitutional:      General: She is not in acute distress.    Appearance: She is well-developed. She is not diaphoretic.  Eyes:     General: No scleral icterus. Pulmonary:     Effort: Pulmonary effort is normal. No respiratory distress.  Abdominal:     General: There is no distension.     Palpations: Abdomen is soft.     Tenderness: There is no abdominal tenderness. There is no guarding or rebound.  Skin:    General: Skin is warm and dry.  Neurological:     Mental Status: She is alert.     Coordination: Coordination normal.      Cervical Exam    Bedside Ultrasound Pt informed that the ultrasound is considered a limited OB ultrasound and is not intended to be a complete ultrasound exam.  Patient also informed that the ultrasound is not being completed with the intent of assessing for fetal or placental anomalies or any pelvic abnormalities.  Explained that the purpose of today's ultrasound is to assess for  viability.  Patient acknowledges the purpose of the exam and the limitations of the study.    My interpretation: viable IUP with vigorous somatic movements observed, FHR 162 bpm by M-mode, subjectively normal fluid, ? Low lying placenta   Labs Results for orders placed or performed during the hospital encounter of 02/26/22 (from the past 24 hour(s))  Urinalysis, Routine w reflex microscopic Urine, Clean Catch     Status: Abnormal   Collection Time: 02/26/22  7:01 AM  Result Value Ref Range   Color, Urine YELLOW YELLOW   APPearance HAZY (A) CLEAR   Specific Gravity, Urine 1.013 1.005 - 1.030   pH 7.0 5.0 - 8.0   Glucose, UA NEGATIVE NEGATIVE mg/dL   Hgb urine dipstick LARGE (A) NEGATIVE   Bilirubin Urine NEGATIVE  NEGATIVE   Ketones, ur NEGATIVE NEGATIVE mg/dL   Protein, ur NEGATIVE NEGATIVE mg/dL  Nitrite NEGATIVE NEGATIVE   Leukocytes,Ua NEGATIVE NEGATIVE   RBC / HPF 21-50 0 - 5 RBC/hpf   WBC, UA 0-5 0 - 5 WBC/hpf   Bacteria, UA RARE (A) NONE SEEN   Squamous Epithelial / LPF 0-5 0 - 5   Granular Casts, UA PRESENT    Amorphous Crystal PRESENT    Sperm, UA PRESENT     Imaging No results found.  MAU Course  Procedures Lab Orders         Urinalysis, Routine w reflex microscopic Urine, Clean Catch    Meds ordered this encounter  Medications   polyethylene glycol powder (GLYCOLAX/MIRALAX) 17 GM/SCOOP powder    Sig: Take 17 g by mouth daily as needed.    Dispense:  510 g    Refill:  11   Imaging Orders  No imaging studies ordered today    MDM moderate  Assessment and Plan  #Vaginal bleeding in pregnancy, first trimester #[redacted] weeks gestation of pregnancy US shows viable IUP with normal cardiac activity. We discussed that vaginal bleeding in the first trimester is common, and that 80-90% of patients will go on to have a normal pregnancy with a live delivery. The remainder are at increased risk for miscarriage, unfortunately there are no known interventions to mitigate this risk. Blood type A+ on multiple prior checks, rhogam not indicated. We discussed return precautions including crescendo abdominal pain, heavy vaginal bleeding soaking >1 pad/hour, and fever.   Dispo: discharged to home in stable condition.   Clarnce Flock, MD/MPH 02/26/22 9:14 AM  Allergies as of 02/26/2022       Reactions   Augmentin [amoxicillin-pot Clavulanate] Hives, Other (See Comments)   Pt has taken Ceftriaxone in the past (??)   Latex Rash   Vaginal irritation with condoms        Medication List     STOP taking these medications    Biotin 100 MG/GM Powd   metoCLOPramide 10 MG tablet Commonly known as: REGLAN       TAKE these medications    Blood Pressure Kit Devi 1 kit by Does  not apply route once a week.   Gojji Weight Scale Misc 1 Device by Does not apply route every 30 (thirty) days.   polyethylene glycol powder 17 GM/SCOOP powder Commonly known as: GLYCOLAX/MIRALAX Take 17 g by mouth daily as needed.   Prenatal Vitamins 28-0.8 MG Tabs Take by mouth.   senna-docusate 8.6-50 MG tablet Commonly known as: Senokot-S Take 1 tablet by mouth daily.

## 2022-02-26 NOTE — Progress Notes (Signed)
Patient presents for initial prenatal visit. Pt had some bleeding this morning and went to MAU. Pt reports the discharge is now brown in color. Her only concern at the moment is her placental placement. I informed her I will discuss this with the provider. No other questions or concerns at this time.

## 2022-02-27 LAB — CBC/D/PLT+RPR+RH+ABO+RUBIGG...
Antibody Screen: NEGATIVE
Basophils Absolute: 0.1 10*3/uL (ref 0.0–0.2)
Basos: 1 %
EOS (ABSOLUTE): 0.1 10*3/uL (ref 0.0–0.4)
Eos: 1 %
HCV Ab: NONREACTIVE
HIV Screen 4th Generation wRfx: NONREACTIVE
Hematocrit: 40.8 % (ref 34.0–46.6)
Hemoglobin: 13.7 g/dL (ref 11.1–15.9)
Hepatitis B Surface Ag: NEGATIVE
Immature Grans (Abs): 0 10*3/uL (ref 0.0–0.1)
Immature Granulocytes: 0 %
Lymphocytes Absolute: 2.2 10*3/uL (ref 0.7–3.1)
Lymphs: 21 %
MCH: 28.4 pg (ref 26.6–33.0)
MCHC: 33.6 g/dL (ref 31.5–35.7)
MCV: 85 fL (ref 79–97)
Monocytes Absolute: 0.6 10*3/uL (ref 0.1–0.9)
Monocytes: 6 %
Neutrophils Absolute: 7.4 10*3/uL — ABNORMAL HIGH (ref 1.4–7.0)
Neutrophils: 71 %
Platelets: 304 10*3/uL (ref 150–450)
RBC: 4.82 x10E6/uL (ref 3.77–5.28)
RDW: 14.3 % (ref 11.7–15.4)
RPR Ser Ql: NONREACTIVE
Rh Factor: POSITIVE
Rubella Antibodies, IGG: 4.22 index (ref >0.99)
WBC: 10.4 10*3/uL (ref 3.4–10.8)

## 2022-02-27 LAB — CERVICOVAGINAL ANCILLARY ONLY
Chlamydia: NEGATIVE
Comment: NEGATIVE
Comment: NEGATIVE
Comment: NORMAL
Neisseria Gonorrhea: NEGATIVE
Trichomonas: NEGATIVE

## 2022-02-27 LAB — HCV INTERPRETATION

## 2022-02-28 LAB — CULTURE, OB URINE

## 2022-02-28 LAB — URINE CULTURE, OB REFLEX

## 2022-03-01 ENCOUNTER — Encounter: Payer: Self-pay | Admitting: Obstetrics and Gynecology

## 2022-03-04 LAB — PANORAMA PRENATAL TEST FULL PANEL:PANORAMA TEST PLUS 5 ADDITIONAL MICRODELETIONS: FETAL FRACTION: 8.8

## 2022-03-05 DIAGNOSIS — Z3480 Encounter for supervision of other normal pregnancy, unspecified trimester: Secondary | ICD-10-CM | POA: Diagnosis not present

## 2022-03-08 LAB — HORIZON CUSTOM: REPORT SUMMARY: POSITIVE — AB

## 2022-03-09 ENCOUNTER — Encounter: Payer: Self-pay | Admitting: Obstetrics and Gynecology

## 2022-03-11 DIAGNOSIS — Z419 Encounter for procedure for purposes other than remedying health state, unspecified: Secondary | ICD-10-CM | POA: Diagnosis not present

## 2022-03-26 ENCOUNTER — Ambulatory Visit (INDEPENDENT_AMBULATORY_CARE_PROVIDER_SITE_OTHER): Payer: Medicaid Other | Admitting: Obstetrics

## 2022-03-26 ENCOUNTER — Encounter: Payer: Self-pay | Admitting: Obstetrics

## 2022-03-26 ENCOUNTER — Institutional Professional Consult (permissible substitution): Payer: Medicaid Other | Admitting: Licensed Clinical Social Worker

## 2022-03-26 ENCOUNTER — Encounter: Payer: Medicaid Other | Admitting: Obstetrics and Gynecology

## 2022-03-26 VITALS — BP 110/72 | Wt 200.0 lb

## 2022-03-26 DIAGNOSIS — Z3480 Encounter for supervision of other normal pregnancy, unspecified trimester: Secondary | ICD-10-CM

## 2022-03-26 DIAGNOSIS — M549 Dorsalgia, unspecified: Secondary | ICD-10-CM

## 2022-03-26 DIAGNOSIS — O99619 Diseases of the digestive system complicating pregnancy, unspecified trimester: Secondary | ICD-10-CM

## 2022-03-26 DIAGNOSIS — Z3A16 16 weeks gestation of pregnancy: Secondary | ICD-10-CM

## 2022-03-26 DIAGNOSIS — K5900 Constipation, unspecified: Secondary | ICD-10-CM

## 2022-03-26 DIAGNOSIS — O219 Vomiting of pregnancy, unspecified: Secondary | ICD-10-CM

## 2022-03-26 NOTE — Progress Notes (Signed)
Subjective:  Anita Allen is a 31 y.o. [phone] at [redacted]w[redacted]d being seen today for ongoing prenatal care.  She is currently monitored for the following issues for this low-risk pregnancy and has Abnormal uterine bleeding (AUB); Difficulty maintaining weight; Constipation; Rash and nonspecific skin eruption; PCOS (polycystic ovarian syndrome); LGSIL on Pap smear of cervix; Supervision of other normal pregnancy, antepartum; and Obesity in pregnancy, antepartum on their problem list.  Patient reports backache and nausea.  Contractions: Not present. Vag. Bleeding: None.  Movement: Present. Denies leaking of fluid.   The following portions of the patient's history were reviewed and updated as appropriate: allergies, current medications, past family history, past medical history, past social history, past surgical history and problem list. Problem list updated.  Objective:   Vitals:   03/26/22 1414  BP: 110/72  Weight: 200 lb (90.7 kg)    Fetal Status: Fetal Heart Rate (bpm): 155   Movement: Present     General:  Alert, oriented and cooperative. Patient is in no acute distress.  Skin: Skin is warm and dry. No rash noted.   Cardiovascular: Normal heart rate noted  Respiratory: Normal respiratory effort, no problems with respiration noted  Abdomen: Soft, gravid, appropriate for gestational age. Pain/Pressure: Absent     Pelvic:  Cervical exam deferred        Extremities: Normal range of motion.  Edema: None  Mental Status: Normal mood and affect. Normal behavior. Normal judgment and thought content.   Urinalysis:      Assessment and Plan:  Pregnancy: [id number] at [redacted]w[redacted]d  1. Supervision of other normal pregnancy, antepartum Rx: - AFP, Serum, Open Spina Bifida - [place] MFM OB COMP + 14 WK; Future  2. Backache symptom - Maternity Belt recommended  3. Nausea and vomiting during pregnancy - dietary changes recommended for N/V in pregnancy - lemon juice in water works well for her  4. Constipation  during pregnancy, antepartum - increase fluids and fiber - Miralax and colace Rx    Preterm labor symptoms and general obstetric precautions including but not limited to vaginal bleeding, contractions, leaking of fluid and fetal movement were reviewed in detail with the patient. Please refer to After Visit Summary for other counseling recommendations.   Return in about 4 weeks (around 04/23/2022) for ROB.   Brock Bad, MD 03/26/22  .t

## 2022-03-26 NOTE — Progress Notes (Signed)
Patient presents for ROB. Patient complains of having continued constipation, back pain, and having headaches.

## 2022-03-27 IMAGING — DX DG CHEST 2V
2 series · 2 of 2 positions shown · non-contrast
Comparison: July 08, 2018

CLINICAL DATA: Chest pain and arm pain with shortness of breath.

EXAM:
CHEST - 2 VIEW

[chest pa]
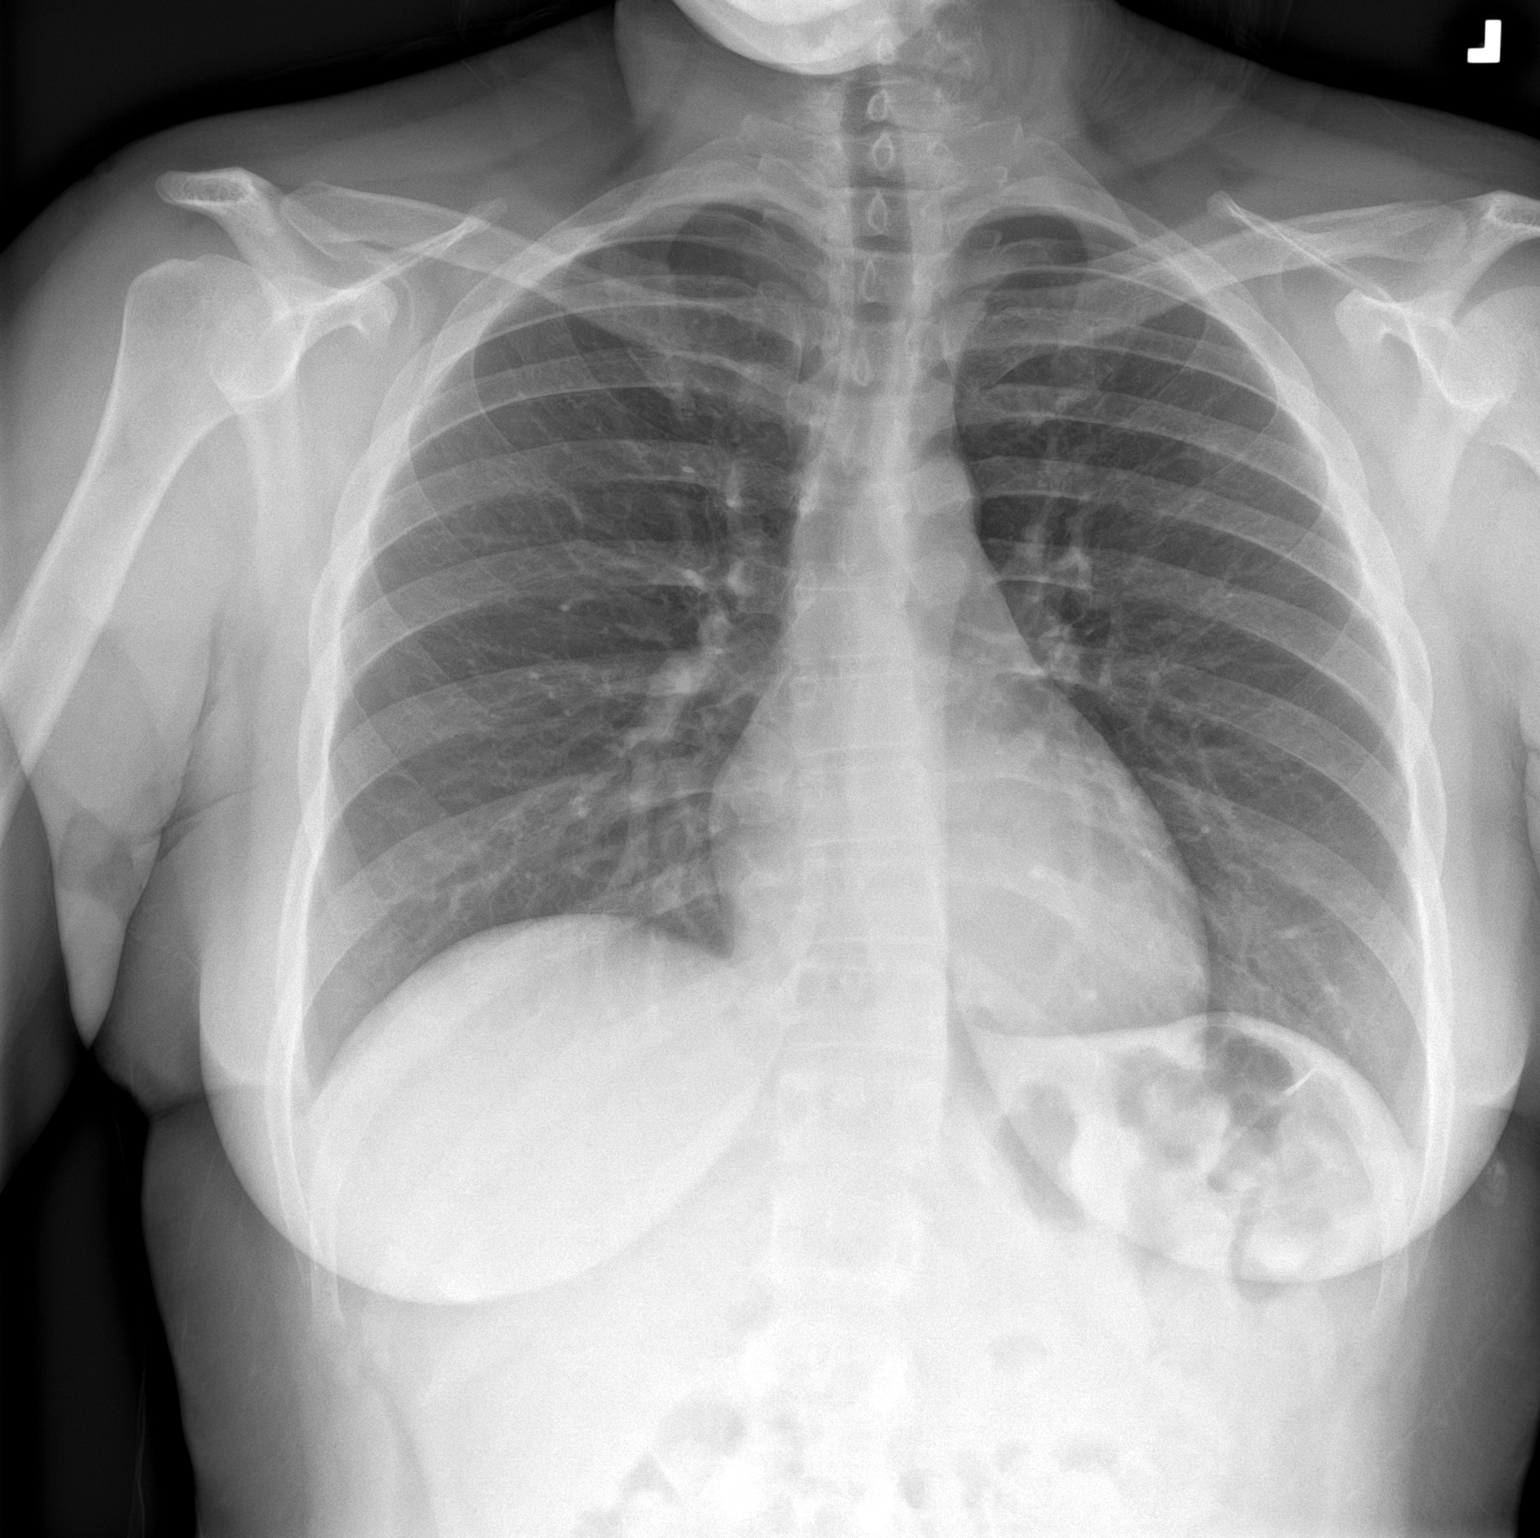

[chest lat]
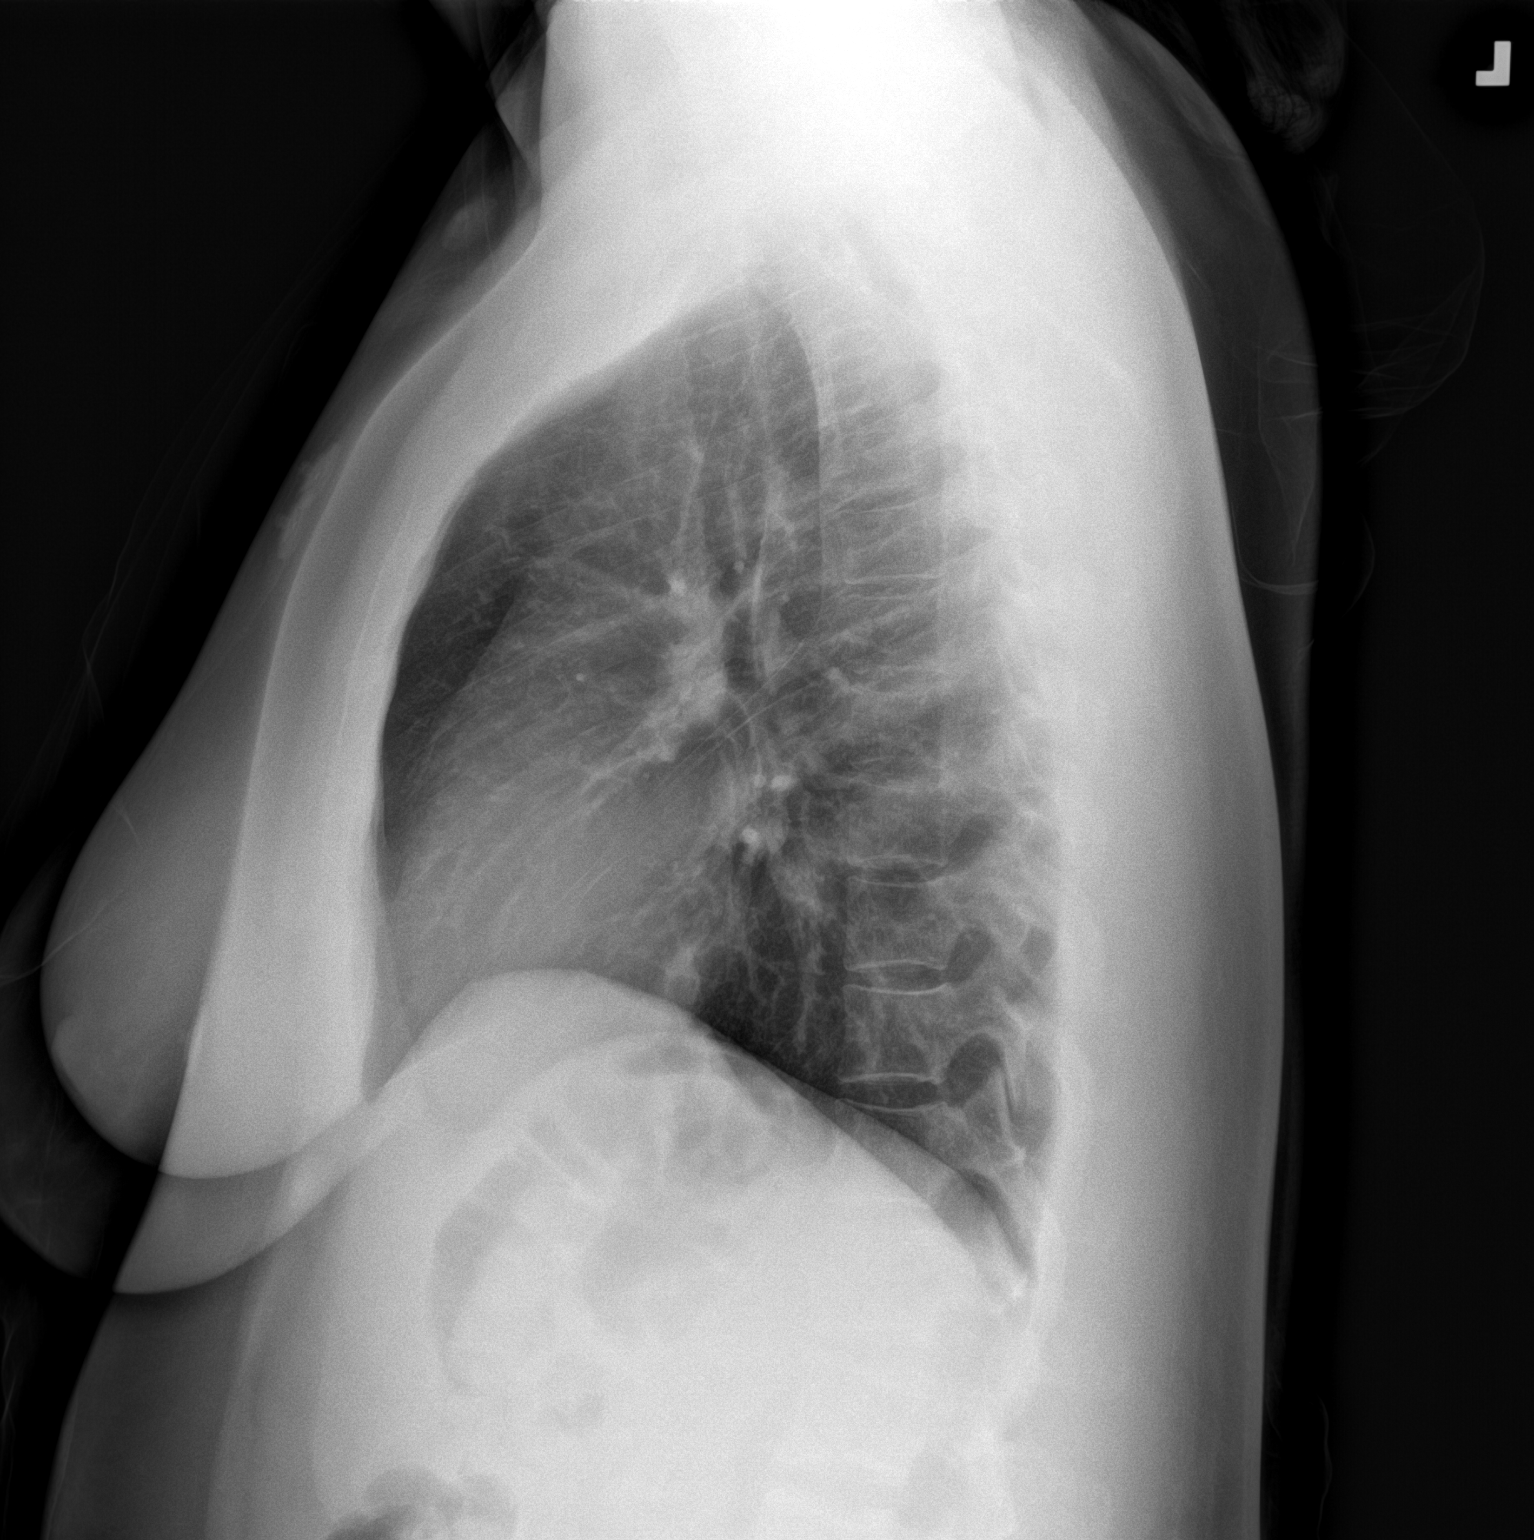

[2 of 2 positions shown; findings below may reference images not displayed]

FINDINGS: The heart size and mediastinal contours are within normal limits.
Both lungs are clear. The visualized skeletal structures are
unremarkable.
IMPRESSION: No active cardiopulmonary disease.

## 2022-03-27 NOTE — Addendum Note (Signed)
Addended by: Coral Ceo A on: 03/27/2022 10:29 AM   Modules accepted: Orders

## 2022-03-28 LAB — AFP, SERUM, OPEN SPINA BIFIDA
AFP MoM: 0.74
AFP Value: 22.9 ng/mL
Gest. Age on Collection Date: 16 weeks
Maternal Age At EDD: 32.1 yr
OSBR Risk 1 IN: 10000
Test Results:: NEGATIVE
Weight: 200 [lb_av]

## 2022-03-31 ENCOUNTER — Telehealth: Payer: Self-pay | Admitting: Emergency Medicine

## 2022-03-31 ENCOUNTER — Telehealth: Payer: Medicaid Other | Admitting: Family Medicine

## 2022-03-31 ENCOUNTER — Other Ambulatory Visit: Payer: Self-pay | Admitting: Emergency Medicine

## 2022-03-31 DIAGNOSIS — O99891 Other specified diseases and conditions complicating pregnancy: Secondary | ICD-10-CM

## 2022-03-31 MED ORDER — CYCLOBENZAPRINE HCL 10 MG PO TABS: 10.0000 mg | ORAL_TABLET | Freq: Three times a day (TID) | ORAL | 1 refills | Status: DC | PRN

## 2022-03-31 NOTE — Progress Notes (Signed)
Will be calling OB to get care for on going back pain in pregnancy.   Patient acknowledged agreement and understanding of the plan.

## 2022-03-31 NOTE — Telephone Encounter (Signed)
Incoming call from pt with c/o continued back pain.  States that Tylenol works but only for 30-40 mins at time.    Pt states that she cannot afford OTC maternity belt at this time. Rx for flexeril, referral for PT.

## 2022-04-01 ENCOUNTER — Ambulatory Visit: Payer: Medicaid Other

## 2022-04-01 NOTE — Therapy (Incomplete)
OUTPATIENT PHYSICAL THERAPY THORACOLUMBAR EVALUATION   Patient Name: Anita Allen MRN: [id number] DOB:08/30/1990, 31 y.o., female Today's Date: 04/01/2022  END OF SESSION:   Past Medical History:  Diagnosis Date   BV (bacterial vaginosis)    Gonorrhea contact, treated    H/O dilation and curettage    Headache    Hx of trichomoniasis    Molar pregnancy 12/2013   Ovarian cyst    PCOS (polycystic ovarian syndrome)    UTI (urinary tract infection)    Vaginal delivery 2012, 2013   Yeast infection    Past Surgical History:  Procedure Laterality Date   brazillian butt lift     DILATION AND CURETTAGE OF UTERUS     DILATION AND EVACUATION N/A 12/20/2013   Procedure: DILATATION AND EVACUATION;  Surgeon: Adam Phenix, MD;  Location: [facility];  Service: Gynecology;  Laterality: N/A;   LIPOSUCTION TRUNK  2021   Patient Active Problem List   Diagnosis Date Noted   Obesity in pregnancy, antepartum 02/26/2022   Supervision of other normal pregnancy, antepartum 02/19/2022   LGSIL on Pap smear of cervix 06/23/2021   Difficulty maintaining weight 06/19/2021   Constipation 06/19/2021   Rash and nonspecific skin eruption 06/19/2021   PCOS (polycystic ovarian syndrome) 06/19/2021   Abnormal uterine bleeding (AUB) 06/13/2021    REFERRING PROVIDER: Coral Ceo, MD  REFERRING DIAG: O99.891,M54.9 (ICD-10-CM) - Back pain during pregnancy   Rationale for Evaluation and Treatment: Rehabilitation  THERAPY DIAG:  No diagnosis found.  ONSET DATE: ***  SUBJECTIVE:                                                                                                                                                                                           SUBJECTIVE STATEMENT: ***  PERTINENT HISTORY:  Pregnant  PAIN:  Are you having pain? Yes: NPRS scale: ***/10 Pain location: *** Pain description: *** Aggravating factors: *** Relieving factors: ***  PRECAUTIONS: Other:  ***  WEIGHT BEARING RESTRICTIONS: No  FALLS:  Has patient fallen in last 6 months? {fallsyesno:27318}  LIVING ENVIRONMENT: Lives with: {[facility] lives with:25569::"lives with their family"} Lives in: {Lives in:25570} Stairs: {opstairs:27293} Has following equipment at home: None  OCCUPATION: ***  PLOF: Independent and Leisure: ***  PATIENT GOALS: ***  NEXT MD VISIT:   OBJECTIVE:   DIAGNOSTIC FINDINGS:  ***  PATIENT SURVEYS:  Modified Oswestry ***   SCREENING FOR RED FLAGS: Bowel or bladder incontinence: {Yes/No:304960894} Spinal tumors: {Yes/No:304960894} Cauda equina syndrome: {Yes/No:304960894} Compression fracture: {Yes/No:304960894} Abdominal aneurysm: {Yes/No:304960894}  COGNITION: Overall cognitive status: Within functional limits for tasks assessed     SENSATION: {sensation:27233}  MUSCLE LENGTH: Hamstrings: Right *** deg; Left *** deg Maisie Fus test: Right *** deg; Left *** deg  POSTURE: {posture:25561}  PALPATION: ***  LUMBAR ROM:   AROM eval  Flexion   Extension   Right lateral flexion   Left lateral flexion   Right rotation   Left rotation    (Blank rows = not tested)  LOWER EXTREMITY ROM:     {AROM/PROM:27142}  Right eval Left eval  Hip flexion    Hip extension    Hip abduction    Hip adduction    Hip internal rotation    Hip external rotation    Knee flexion    Knee extension    Ankle dorsiflexion    Ankle plantarflexion    Ankle inversion    Ankle eversion     (Blank rows = not tested)  LOWER EXTREMITY MMT:    MMT Right eval Left eval  Hip flexion    Hip extension    Hip abduction    Hip adduction    Hip internal rotation    Hip external rotation    Knee flexion    Knee extension    Ankle dorsiflexion    Ankle plantarflexion    Ankle inversion    Ankle eversion     (Blank rows = not tested)  LUMBAR SPECIAL TESTS:  {lumbar special test:25242}  FUNCTIONAL TESTS:  {Functional tests:24029}  GAIT: Distance  walked: *** Assistive device utilized: {Assistive devices:23999} Level of assistance: {Levels of assistance:24026} Comments: ***  TODAY'S TREATMENT:                                                                                DATE: 04/01/22  HEP established-see below   Check all conditions that are expected to impact treatment: {Conditions expected to impact treatment:28273}   If treatment provided at initial evaluation, no treatment charged due to lack of authorization.       PATIENT EDUCATION:  Education details: *** Person educated: Patient Education method: Explanation Education comprehension: verbalized understanding and returned demonstration  HOME EXERCISE PROGRAM: ***  ASSESSMENT:  CLINICAL IMPRESSION: Patient is a 31 y.o. female who was seen today for physical therapy evaluation and treatment for LBP during pregnancy.   OBJECTIVE IMPAIRMENTS: {opptimpairments:25111}.   ACTIVITY LIMITATIONS: {activitylimitations:27494}  PARTICIPATION LIMITATIONS: {participationrestrictions:25113}  PERSONAL FACTORS: {Personal factors:25162} are also affecting patient's functional outcome.   REHAB POTENTIAL: {rehabpotential:25112}  CLINICAL DECISION MAKING: Stable/uncomplicated  EVALUATION COMPLEXITY: Low   GOALS: Goals reviewed with patient? Yes  SHORT TERM GOALS: Target date: 04/29/2022    Be independent in initial HEP Baseline: Goal status: INITIAL  2.  *** Baseline:  Goal status: {GOALSTATUS:25110}  3.  *** Baseline:  Goal status: {GOALSTATUS:25110}  4.  *** Baseline:  Goal status: {GOALSTATUS:25110}  5.  *** Baseline:  Goal status: {GOALSTATUS:25110}  6.  *** Baseline:  Goal status: {GOALSTATUS:25110}  LONG TERM GOALS: Target date: 05/27/2022    Be independent in advanced HEP Baseline: no current HEP Goal status: INITIAL  2.  Reduce ODI score to < or = to  Baseline:  Goal status: INITIAL  3.  *** Baseline:  Goal status:  {GOALSTATUS:25110}  4.  *** Baseline:  Goal  status: {GOALSTATUS:25110}  5.  *** Baseline:  Goal status: {GOALSTATUS:25110}  6.  *** Baseline:  Goal status: {GOALSTATUS:25110}  PLAN:  PT FREQUENCY: 2x/week  PT DURATION: 8 weeks  PLANNED INTERVENTIONS: Therapeutic exercises, Therapeutic activity, Neuromuscular re-education, Balance training, Gait training, Patient/Family education, Self Care, Joint mobilization, Aquatic Therapy, Dry Needling, Electrical stimulation, Cryotherapy, Moist heat, and Manual therapy.  PLAN FOR NEXT SESSION: review HEP, core strength, body mechanics education   Lorrene Reid, [place] 04/01/22 7:43 AM   [facility] [address] [place], [place] [address] Phone # [phone] Fax [phone]

## 2022-04-09 ENCOUNTER — Ambulatory Visit (HOSPITAL_COMMUNITY)
Admission: EM | Admit: 2022-04-09 | Discharge: 2022-04-09 | Disposition: A | Discharge status: Home / Self Care | Payer: Medicaid Other | Attending: Internal Medicine | Admitting: Internal Medicine

## 2022-04-09 ENCOUNTER — Encounter (HOSPITAL_COMMUNITY): Payer: Self-pay | Admitting: Emergency Medicine

## 2022-04-09 DIAGNOSIS — N760 Acute vaginitis: Secondary | ICD-10-CM | POA: Insufficient documentation

## 2022-04-09 NOTE — ED Triage Notes (Signed)
Pt presents for STD testing. States she has had a strong vaginal odor for a couple days. Denies fishy odor.

## 2022-04-09 NOTE — Discharge Instructions (Addendum)
Please avoid sexual intercourse until lab results are available We will call you with recommendations if labs abnormal Return to urgent care if you have any other concerns If you experience lower abdominal cramping, vaginal bleeding or decreased fetal movements please go to maternal assessment unit to be evaluated further.

## 2022-04-10 DIAGNOSIS — Z419 Encounter for procedure for purposes other than remedying health state, unspecified: Secondary | ICD-10-CM | POA: Diagnosis not present

## 2022-04-10 LAB — CERVICOVAGINAL ANCILLARY ONLY
Bacterial Vaginitis (gardnerella): POSITIVE — AB
Candida Glabrata: NEGATIVE
Candida Vaginitis: NEGATIVE
Chlamydia: NEGATIVE
Comment: NEGATIVE
Comment: NEGATIVE
Comment: NEGATIVE
Comment: NEGATIVE
Comment: NEGATIVE
Comment: NORMAL
Neisseria Gonorrhea: NEGATIVE
Trichomonas: NEGATIVE

## 2022-04-12 NOTE — ED Provider Notes (Signed)
[place]    CSN: [id number] Arrival date & time: 04/09/22  1953      History   Chief Complaint Chief Complaint  Patient presents with   SEXUALLY TRANSMITTED DISEASE    HPI Anita Allen is a 31 y.o. female currently in the second trimester of pregnancy comes to urgent care requesting STD screening.  She complains of a strong vaginal odor over the past couple of days.  Patient has mild vaginal discharge.  No vaginal itching.  No dysuria urgency or frequency.  She suspects that she has been exposed to sexually transmitted infection.  No abdominal pain.  Fetal movements are unchanged. HPI  Past Medical History:  Diagnosis Date   BV (bacterial vaginosis)    Gonorrhea contact, treated    H/O dilation and curettage    Headache    Hx of trichomoniasis    Molar pregnancy 12/2013   Ovarian cyst    PCOS (polycystic ovarian syndrome)    UTI (urinary tract infection)    Vaginal delivery 2012, 2013   Yeast infection     Patient Active Problem List   Diagnosis Date Noted   Obesity in pregnancy, antepartum 02/26/2022   Supervision of other normal pregnancy, antepartum 02/19/2022   LGSIL on Pap smear of cervix 06/23/2021   Difficulty maintaining weight 06/19/2021   Constipation 06/19/2021   Rash and nonspecific skin eruption 06/19/2021   PCOS (polycystic ovarian syndrome) 06/19/2021   Abnormal uterine bleeding (AUB) 06/13/2021    Past Surgical History:  Procedure Laterality Date   brazillian butt lift     DILATION AND CURETTAGE OF UTERUS     DILATION AND EVACUATION N/A 12/20/2013   Procedure: DILATATION AND EVACUATION;  Surgeon: Woodroe Mode, MD;  Location: [facility];  Service: Gynecology;  Laterality: N/A;   LIPOSUCTION TRUNK  2021    OB History     Gravida  7   Para  3   Term  3   Preterm  0   AB  3   Living  3      SAB  1   IAB  2   Ectopic  0   Multiple  0   Live Births  3            Home Medications    Prior to Admission  medications   Medication Sig Start Date End Date Taking? Authorizing Provider  aspirin 81 MG chewable tablet Chew 1 tablet (81 mg total) by mouth daily. 02/26/22   Laury Deep, CNM  Blood Pressure Monitoring (BLOOD PRESSURE KIT) DEVI 1 kit by Does not apply route once a week. 02/19/22   Constant, Peggy, MD  cyclobenzaprine (FLEXERIL) 10 MG tablet Take 1 tablet (10 mg total) by mouth every 8 (eight) hours as needed for muscle spasms. 03/31/22   Shelly Bombard, MD  Misc. Devices (GOJJI WEIGHT SCALE) MISC 1 Device by Does not apply route every 30 (thirty) days. 02/19/22   Constant, Peggy, MD  polyethylene glycol powder (GLYCOLAX/MIRALAX) 17 GM/SCOOP powder Take 17 g by mouth daily as needed. 02/26/22   Clarnce Flock, MD  Prenatal Vit-Fe Fumarate-FA (PRENATAL VITAMINS) 28-0.8 MG TABS Take by mouth.    [provider]    Family History Family History  Problem Relation Age of Onset   Hypertension Mother    Heart attack Mother    Thyroid disease Mother    Heart disease Father    Hypertension Father    Heart attack Father  Social History Social History   Tobacco Use   Smoking status: Never   Smokeless tobacco: Never  Vaping Use   Vaping Use: Never used  Substance Use Topics   Alcohol use: Not Currently    Comment: not often, not since confirmed pregnancy   Drug use: No     Allergies   Augmentin [amoxicillin-pot clavulanate] and Latex   Review of Systems Review of Systems As per HPI  Physical Exam Triage Vital Signs ED Triage Vitals  Enc Vitals Group     BP 04/09/22 2014 (!) 131/91     Pulse Rate 04/09/22 2014 93     Resp 04/09/22 2014 18     Temp 04/09/22 2014 99.6 F (37.6 C)     Temp Source 04/09/22 2014 Oral     SpO2 04/09/22 2014 96 %     Weight --      Height --      Head Circumference --      Peak Flow --      Pain Score 04/09/22 2013 0     Pain Loc --      Pain Edu? --      Excl. in [place]? --    No data found.  Updated Vital  Signs BP (!) 131/91 (BP Location: Right Arm)   Pulse 93   Temp 99.6 F (37.6 C) (Oral)   Resp 18   LMP 12/01/2021 (Approximate)   SpO2 96%   Visual Acuity Right Eye Distance:   Left Eye Distance:   Bilateral Distance:    Right Eye Near:   Left Eye Near:    Bilateral Near:     Physical Exam Vitals and nursing note reviewed.  Constitutional:      General: She is not in acute distress.    Appearance: She is not ill-appearing.  Cardiovascular:     Rate and Rhythm: Normal rate and regular rhythm.     Pulses: Normal pulses.     Heart sounds: Normal heart sounds.  Abdominal:     General: Bowel sounds are normal.     Palpations: Abdomen is soft.  Neurological:     Mental Status: She is alert.      UC Treatments / Results  Labs (all labs ordered are listed, but only abnormal results are displayed) Labs Reviewed  CERVICOVAGINAL ANCILLARY ONLY - Abnormal; Notable for the following components:      Result Value   Bacterial Vaginitis (gardnerella) Positive (*)    All other components within normal limits    EKG   Radiology No results found.  Procedures Procedures (including critical care time)  Medications Ordered in UC Medications - No data to display  Initial Impression / Assessment and Plan / UC Course  I have reviewed the triage vital signs and the nursing notes.  Pertinent labs & imaging results that were available during my care of the patient were reviewed by me and considered in my medical decision making (see chart for details).     1.  Acute vaginitis: Cervicovaginal swab for GC/chlamydia/trichomonas/bacterial vaginosis/vaginal yeast Will call patient with recommendations if labs are abnormal Patient is advised to abstain from sexual intercourse until lab results are available Return to urgent care if symptoms worsen. Final Clinical Impressions(s) / UC Diagnoses   Final diagnoses:  Acute vaginitis     Discharge Instructions      Please  avoid sexual intercourse until lab results are available We will call you with recommendations if labs abnormal Return to  urgent care if you have any other concerns If you experience lower abdominal cramping, vaginal bleeding or decreased fetal movements please go to maternal assessment unit to be evaluated further.   ED Prescriptions   None    PDMP not reviewed this encounter.   Chase Picket, MD 04/12/22 2028

## 2022-04-13 ENCOUNTER — Other Ambulatory Visit: Payer: Self-pay | Admitting: *Deleted

## 2022-04-13 DIAGNOSIS — N760 Acute vaginitis: Secondary | ICD-10-CM

## 2022-04-13 MED ORDER — METRONIDAZOLE 500 MG PO TABS: 500.0000 mg | ORAL_TABLET | Freq: Two times a day (BID) | ORAL | 0 refills | Status: DC

## 2022-04-13 NOTE — Progress Notes (Signed)
TC from pt reporting +BV at urgent care. Reports provider at urgent care not comfortable with RX for pregnant pts. Results confirmed. RX [place] per protocol.

## 2022-04-23 ENCOUNTER — Encounter: Payer: Self-pay | Admitting: Obstetrics

## 2022-04-23 ENCOUNTER — Other Ambulatory Visit: Payer: Medicaid Other

## 2022-04-23 ENCOUNTER — Ambulatory Visit (INDEPENDENT_AMBULATORY_CARE_PROVIDER_SITE_OTHER): Payer: Medicaid Other | Admitting: Obstetrics

## 2022-04-23 VITALS — BP 126/74 | HR 85 | Wt 204.2 lb

## 2022-04-23 DIAGNOSIS — Z3A20 20 weeks gestation of pregnancy: Secondary | ICD-10-CM

## 2022-04-23 DIAGNOSIS — Z3482 Encounter for supervision of other normal pregnancy, second trimester: Secondary | ICD-10-CM

## 2022-04-23 DIAGNOSIS — Z3480 Encounter for supervision of other normal pregnancy, unspecified trimester: Secondary | ICD-10-CM

## 2022-04-23 MED ORDER — CITRANATAL HARMONY 27-1-260 MG PO CAPS: 1.0000 | ORAL_CAPSULE | Freq: Every day | ORAL | 90 refills | Status: DC

## 2022-04-23 NOTE — Progress Notes (Signed)
Subjective:  Anita Allen is a 31 y.o. [phone] at [redacted]w[redacted]d being seen today for ongoing prenatal care.  She is currently monitored for the following issues for this low-risk pregnancy and has Abnormal uterine bleeding (AUB); Difficulty maintaining weight; Constipation; Rash and nonspecific skin eruption; PCOS (polycystic ovarian syndrome); LGSIL on Pap smear of cervix; Supervision of other normal pregnancy, antepartum; and Obesity in pregnancy, antepartum on their problem list.  Patient reports no complaints.  Contractions: Not present. Vag. Bleeding: None.  Movement: Present. Denies leaking of fluid.   The following portions of the patient's history were reviewed and updated as appropriate: allergies, current medications, past family history, past medical history, past social history, past surgical history and problem list. Problem list updated.  Objective:   Vitals:   04/23/22 0855  BP: 126/74  Pulse: 85  Weight: 204 lb 3.2 oz (92.6 kg)    Fetal Status: Fetal Heart Rate (bpm): 158   Movement: Present     General:  Alert, oriented and cooperative. Patient is in no acute distress.  Skin: Skin is warm and dry. No rash noted.   Cardiovascular: Normal heart rate noted  Respiratory: Normal respiratory effort, no problems with respiration noted  Abdomen: Soft, gravid, appropriate for gestational age. Pain/Pressure: Present     Pelvic:  Cervical exam deferred        Extremities: Normal range of motion.  Edema: Trace  Mental Status: Normal mood and affect. Normal behavior. Normal judgment and thought content.   Urinalysis:      Assessment and Plan:  Pregnancy: [id number] at [redacted]w[redacted]d  1. Supervision of other normal pregnancy, antepartum Rx: - Prenat-FeFmCb-DSS-FA-DHA w/o A (CITRANATAL HARMONY) 27-1-260 MG CAPS; Take 1 capsule by mouth daily before breakfast.  Dispense: 30 capsule; Refill: 90   Preterm labor symptoms and general obstetric precautions including but not limited to vaginal  bleeding, contractions, leaking of fluid and fetal movement were reviewed in detail with the patient. Please refer to After Visit Summary for other counseling recommendations.   Return in about 4 weeks (around 05/21/2022) for ROB.   Brock Bad, MD 04/23/22

## 2022-04-23 NOTE — Progress Notes (Signed)
Patient presents for ROB visit. Pt needs a refill on her prenatal.

## 2022-04-28 ENCOUNTER — Encounter: Payer: Self-pay | Admitting: *Deleted

## 2022-04-28 ENCOUNTER — Ambulatory Visit: Payer: Medicaid Other | Admitting: *Deleted

## 2022-04-28 ENCOUNTER — Other Ambulatory Visit: Payer: Self-pay | Admitting: *Deleted

## 2022-04-28 ENCOUNTER — Ambulatory Visit: Payer: Medicaid Other | Attending: Obstetrics

## 2022-04-28 VITALS — BP 116/68 | HR 79

## 2022-04-28 DIAGNOSIS — Z362 Encounter for other antenatal screening follow-up: Secondary | ICD-10-CM

## 2022-04-28 DIAGNOSIS — O99212 Obesity complicating pregnancy, second trimester: Secondary | ICD-10-CM | POA: Diagnosis not present

## 2022-04-28 DIAGNOSIS — Z3A21 21 weeks gestation of pregnancy: Secondary | ICD-10-CM | POA: Insufficient documentation

## 2022-04-28 DIAGNOSIS — O09292 Supervision of pregnancy with other poor reproductive or obstetric history, second trimester: Secondary | ICD-10-CM | POA: Insufficient documentation

## 2022-04-28 DIAGNOSIS — Z363 Encounter for antenatal screening for malformations: Secondary | ICD-10-CM | POA: Diagnosis not present

## 2022-04-28 DIAGNOSIS — Z3480 Encounter for supervision of other normal pregnancy, unspecified trimester: Secondary | ICD-10-CM | POA: Diagnosis not present

## 2022-04-28 NOTE — Therapy (Unsigned)
OUTPATIENT PHYSICAL THERAPY FEMALE PELVIC EVALUATION   Patient Name: Anita Allen MRN: [id number] DOB:[birth year], 31 y.o., female Today's Date: 04/29/2022  END OF SESSION:  PT End of Session - 04/29/22 0850     Visit Number 1    Date for PT Re-Evaluation 07/22/22    Authorization Type MEDICAID [facility]    PT Start Time 0851    PT Stop Time 0930    PT Time Calculation (min) 39 min    Activity Tolerance Patient tolerated treatment well    Behavior During [facility] for tasks assessed/performed             Past Medical History:  Diagnosis Date   BV (bacterial vaginosis)    Gonorrhea contact, treated    H/O dilation and curettage    Headache    Hx of trichomoniasis    Molar pregnancy 12/2013   Ovarian cyst    PCOS (polycystic ovarian syndrome)    UTI (urinary tract infection)    Vaginal delivery 2012, 2013   Yeast infection    Past Surgical History:  Procedure Laterality Date   brazillian butt lift     DILATION AND CURETTAGE OF UTERUS     DILATION AND EVACUATION N/A 12/20/2013   Procedure: DILATATION AND EVACUATION;  Surgeon: Adam PhenixJames G Arnold, MD;  Location: [facility];  Service: Gynecology;  Laterality: N/A;   LIPOSUCTION TRUNK  2021   Patient Active Problem List   Diagnosis Date Noted   Obesity in pregnancy, antepartum 02/26/2022   Supervision of other normal pregnancy, antepartum 02/19/2022   LGSIL on Pap smear of cervix 06/23/2021   Difficulty maintaining weight 06/19/2021   Constipation 06/19/2021   Rash and nonspecific skin eruption 06/19/2021   PCOS (polycystic ovarian syndrome) 06/19/2021   Abnormal uterine bleeding (AUB) 06/13/2021    PCP: No PCP  REFERRING PROVIDER: Brock BadHarper, Charles A, MD   REFERRING DIAG: O99.891,M54.9 (ICD-10-CM) - Back pain during pregnancy   THERAPY DIAG:  Other muscle spasm  Other low back pain  Muscle weakness (generalized)  Rationale for Evaluation and Treatment: Rehabilitation  ONSET DATE: 3 weeks it got  worse  SUBJECTIVE:                                                                                                                                                                                           SUBJECTIVE STATEMENT: [redacted] weeks gestation of current pregnancy.  Pt has always had back pai nbut it is much worse now being pregnant.  Sitting, standing, sleeping is hard. Fluid intake: Yes: a lot of water    PAIN:  Are you having pain? Yes NPRS  scale: 4/10 Pain location:  low back and can go all the way up to neck  Pain type: discomfort Pain description: constant and but goes up or down in intensity    Aggravating factors: sitting for a long time or standing - sometimes an hour or less Relieving factors: tylenol extra strength sometimes works  PRECAUTIONS: None  WEIGHT BEARING RESTRICTIONS: No  FALLS:  Has patient fallen in last 6 months? No  LIVING ENVIRONMENT: Lives with:  children Lives in: House/apartment   OCCUPATION: yes sitting - ankles are starting to swell form sitting  PLOF: Independent  PATIENT GOALS: sit and stand and sleep  PERTINENT HISTORY:  3 previous vaginal deliveries, chronic back pain Sexual abuse: No  BOWEL MOVEMENT: Pain with bowel movement: Yes Type of bowel movement:Type (Bristol Stool Scale) 4, Frequency 1 evey 3-4 days, and Strain No Fully empty rectum: No Leakage: No Pads: No Fiber supplement: Yes: fiber in diet  URINATION: Pain with urination: No Fully empty bladder: No Stream: Strong Urgency: Yes:   Frequency: 30 min; 2-3 nocturia Leakage:  No only when sneezing Pads: No  INTERCOURSE: Pain with intercourse:  No   PREGNANCY: Vaginal deliveries 3 Tearing Yes:   C-section deliveries 0 Currently pregnant Yes: 22  PROLAPSE: None   OBJECTIVE:   DIAGNOSTIC FINDINGS:    PATIENT SURVEYS:    PFIQ-7 = 57  COGNITION: Overall cognitive status: Within functional limits for tasks assessed     SENSATION: Light touch:  Appears intact Proprioception: Appears intact  MUSCLE LENGTH: Hamstrings: Right WFL  deg; Left WFL deg Thomas test:   LUMBAR SPECIAL TESTS:  Straight leg raise test: Negative  FUNCTIONAL TESTS:  Single leg stand - trendelenburg bil  GAIT:  Comments: short stride length  POSTURE: rounded shoulders, increased lumbar lordosis, and anterior pelvic tilt  PELVIC ALIGNMENT:  LUMBARAROM/PROM:  A/PROM A/PROM  eval  Flexion 80%  Extension WFL   Right lateral flexion WFL   Left lateral flexion WFL   Right rotation 80%  Left rotation 80%   (Blank rows = not tested)  LOWER EXTREMITY ROM:  Passive ROM Right eval Left eval  Hip flexion [facility]  [facility]   Hip extension    Hip abduction    Hip adduction    Hip internal rotation 75% WFL   Hip external rotation [facility]  [facility]   Knee flexion    Knee extension    Ankle dorsiflexion    Ankle plantarflexion    Ankle inversion    Ankle eversion     (Blank rows = not tested)  LOWER EXTREMITY MMT:  MMT Right eval Left eval  Hip flexion 5/5 4/5  Hip extension    Hip abduction 4/5 4/5  Hip adduction 5/5 5/5  Hip internal rotation 5/5 5/5  Hip external rotation 5/5 5/5  Knee flexion    Knee extension    Ankle dorsiflexion    Ankle plantarflexion    Ankle inversion    Ankle eversion     PALPATION:   General  lumbar and upper thoracic/cervical paraspinals tender to palpation and some inflammation present                External Perineal Exam mild tenderness of Rt levators , descending perineal body                             Internal Pelvic Floor tender and inflamed levators and obturator left side, tender and tight  Rt levators and obturator but not as much tenderness on the Rt side  Patient confirms identification and approves PT to assess internal pelvic floor and treatment Yes No emotional/communication barriers or cognitive limitation. Patient is motivated to learn. Patient understands and agrees with treatment goals and plan.  PT explains patient will be examined in standing, sitting, and lying down to see how their muscles and joints work. When they are ready, they will be asked to remove their underwear so PT can examine their perineum. The patient is also given the option of providing their own chaperone as one is not provided in our facility. The patient also has the right and is explained the right to defer or refuse any part of the evaluation or treatment including the internal exam. With the patient's consent, PT will use one gloved finger to gently assess the muscles of the pelvic floor, seeing how well it contracts and relaxes and if there is muscle symmetry. After, the patient will get dressed and PT and patient will discuss exam findings and plan of care. PT and patient discuss plan of care, schedule, attendance policy and HEP activities.   PELVIC MMT:   MMT eval  Vaginal 3/5 can hold 20 sec  Internal Anal Sphincter   External Anal Sphincter   Puborectalis   Diastasis Recti   (Blank rows = not tested)        TONE: high  PROLAPSE: none  TODAY'S TREATMENT:                                                                                                                              DATE: 04/29/22  EVAL and initial self care with HEP as seen below - educated and performed today Check all possible CPT codes: 02585 - Self Care    Check all conditions that are expected to impact treatment: Musculoskeletal disorders   If treatment provided at initial evaluation, no treatment charged due to lack of authorization.        PATIENT EDUCATION:  Education details: Access Code: [email or web address] Person educated: Patient Education method: Explanation, Demonstration, Tactile cues, Verbal cues, and Handouts Education comprehension: verbalized understanding and returned demonstration  HOME EXERCISE PROGRAM: Access Code: [email or web address] URL: [email or web address] Date: 04/29/2022 Prepared by: Dwana Curd  Exercises - Plank with Thoracic Rotation on Counter  - 1 x daily - 7 x weekly - 3 sets - 10 reps - Supine Hip Internal and External Rotation  - 1 x daily - 7 x weekly - 1 sets - 10 reps - 5 sec hold - Supine Butterfly Groin Stretch  - 1 x daily - 7 x weekly - 1 sets - 3 reps - 30 sec hold - Standing 'L' Stretch at Counter  - 1 x daily - 7 x weekly - 3 sets - 10 reps  ASSESSMENT:  CLINICAL IMPRESSION: Patient is a 31 y.o. female who was seen today for physical therapy evaluation and treatment for pelvic  pain during pregnancy. Pt arrives and is accompanied by her mother today.  Pt has significant tenderness along paraspinals of lumbar, thoracic, and cervical muscles.  She has decreased mobility observed of the thoracic spine and lumbar flexion. Pt has 3/5 MMT of pelvic floor and can hold 20 sec.  Pt has good muscle coordination of the pelvic floor.  She has tight and tender levators and obturator with Lt worse than Rt.  Pt will benefit from skilled PT to address pain management and core strength for improved function without increased back pain.  OBJECTIVE IMPAIRMENTS: decreased activity tolerance, difficulty walking, decreased ROM, decreased strength, increased fascial restrictions, increased muscle spasms, impaired tone, postural dysfunction, and pain.   ACTIVITY LIMITATIONS: carrying, lifting, sitting, standing, and sleeping  PARTICIPATION LIMITATIONS: community activity and occupation  PERSONAL FACTORS: 1-2 comorbidities: 3 previous vaginal deliveries; chronic back pain  are also affecting patient's functional outcome.   REHAB POTENTIAL: Excellent  CLINICAL DECISION MAKING: Evolving/moderate complexity  EVALUATION COMPLEXITY: Moderate   GOALS: Goals reviewed with patient? Yes    LONG TERM GOALS: Target date: 07/22/2022  Pt will be independent with advanced HEP to maintain improvements made throughout therapy  Baseline:  Goal status: INITIAL  2.  Pt will report 75%  reduction of pain due to improvements in posture, strength, and muscle length  Baseline:  Goal status: INITIAL  3.  Pt will be able to sit and stand for at least 2 hours for improved completion of daily tasks Baseline:  Goal status: INITIAL  4.  Pt will have 5/5 MMT bil LE without pain for imporved standing and walking without trendelenburg Baseline:  Goal status: INITIAL    PLAN:  PT FREQUENCY: 1x/week  PT DURATION: 12 weeks  PLANNED INTERVENTIONS: Therapeutic exercises, Therapeutic activity, Neuromuscular re-education, Balance training, Gait training, Patient/Family education, Self Care, Joint mobilization, Dry Needling, Electrical stimulation, Cryotherapy, Moist heat, Taping, Biofeedback, Manual therapy, and Re-evaluation  PLAN FOR NEXT SESSION: STM to paraspinals; core strength and hip strength; rotation exercises   Brayton Caves Phylicia Mcgaugh, PT 04/29/2022, 11:51 AM

## 2022-04-29 ENCOUNTER — Encounter: Payer: Self-pay | Admitting: Physical Therapy

## 2022-04-29 ENCOUNTER — Other Ambulatory Visit: Payer: Self-pay

## 2022-04-29 ENCOUNTER — Ambulatory Visit: Payer: Medicaid Other | Attending: Obstetrics | Admitting: Physical Therapy

## 2022-04-29 DIAGNOSIS — M6281 Muscle weakness (generalized): Secondary | ICD-10-CM | POA: Insufficient documentation

## 2022-04-29 DIAGNOSIS — M62838 Other muscle spasm: Secondary | ICD-10-CM | POA: Diagnosis not present

## 2022-04-29 DIAGNOSIS — O99891 Other specified diseases and conditions complicating pregnancy: Secondary | ICD-10-CM | POA: Insufficient documentation

## 2022-04-29 DIAGNOSIS — M5459 Other low back pain: Secondary | ICD-10-CM | POA: Diagnosis not present

## 2022-04-29 DIAGNOSIS — Z3A21 21 weeks gestation of pregnancy: Secondary | ICD-10-CM | POA: Diagnosis not present

## 2022-04-29 DIAGNOSIS — M549 Dorsalgia, unspecified: Secondary | ICD-10-CM | POA: Insufficient documentation

## 2022-05-05 ENCOUNTER — Inpatient Hospital Stay (HOSPITAL_COMMUNITY)
Admission: AD | Admit: 2022-05-05 | Discharge: 2022-05-05 | Disposition: A | Discharge status: Home / Self Care | Payer: Medicaid Other | Attending: Obstetrics & Gynecology | Admitting: Obstetrics & Gynecology

## 2022-05-05 ENCOUNTER — Ambulatory Visit: Payer: Medicaid Other | Admitting: Physical Therapy

## 2022-05-05 ENCOUNTER — Encounter (HOSPITAL_COMMUNITY): Payer: Self-pay | Admitting: Family Medicine

## 2022-05-05 DIAGNOSIS — M6281 Muscle weakness (generalized): Secondary | ICD-10-CM | POA: Diagnosis not present

## 2022-05-05 DIAGNOSIS — O26892 Other specified pregnancy related conditions, second trimester: Secondary | ICD-10-CM | POA: Insufficient documentation

## 2022-05-05 DIAGNOSIS — Z3A22 22 weeks gestation of pregnancy: Secondary | ICD-10-CM | POA: Insufficient documentation

## 2022-05-05 DIAGNOSIS — M5459 Other low back pain: Secondary | ICD-10-CM

## 2022-05-05 DIAGNOSIS — K219 Gastro-esophageal reflux disease without esophagitis: Secondary | ICD-10-CM | POA: Diagnosis not present

## 2022-05-05 DIAGNOSIS — M549 Dorsalgia, unspecified: Secondary | ICD-10-CM | POA: Diagnosis not present

## 2022-05-05 DIAGNOSIS — O09292 Supervision of pregnancy with other poor reproductive or obstetric history, second trimester: Secondary | ICD-10-CM | POA: Diagnosis not present

## 2022-05-05 DIAGNOSIS — Z1152 Encounter for screening for COVID-19: Secondary | ICD-10-CM | POA: Diagnosis not present

## 2022-05-05 DIAGNOSIS — O99891 Other specified diseases and conditions complicating pregnancy: Secondary | ICD-10-CM | POA: Diagnosis not present

## 2022-05-05 DIAGNOSIS — O99612 Diseases of the digestive system complicating pregnancy, second trimester: Secondary | ICD-10-CM | POA: Diagnosis not present

## 2022-05-05 DIAGNOSIS — Z3480 Encounter for supervision of other normal pregnancy, unspecified trimester: Secondary | ICD-10-CM

## 2022-05-05 DIAGNOSIS — M62838 Other muscle spasm: Secondary | ICD-10-CM

## 2022-05-05 DIAGNOSIS — Z3A21 21 weeks gestation of pregnancy: Secondary | ICD-10-CM | POA: Diagnosis not present

## 2022-05-05 LAB — URINALYSIS, ROUTINE W REFLEX MICROSCOPIC
Bacteria, UA: NONE SEEN
Bilirubin Urine: NEGATIVE
Glucose, UA: NEGATIVE mg/dL
Ketones, ur: NEGATIVE mg/dL
Nitrite: NEGATIVE
Protein, ur: NEGATIVE mg/dL
Specific Gravity, Urine: 1.016 (ref 1.005–1.030)
pH: 6 (ref 5.0–8.0)

## 2022-05-05 LAB — RESP PANEL BY RT-PCR (RSV, FLU A&B, COVID)  RVPGX2
Influenza A by PCR: NEGATIVE
Influenza B by PCR: NEGATIVE
Resp Syncytial Virus by PCR: NEGATIVE
SARS Coronavirus 2 by RT PCR: NEGATIVE

## 2022-05-05 MED ORDER — FAMOTIDINE 40 MG PO TABS: 20.0000 mg | ORAL_TABLET | Freq: Every day | ORAL | 2 refills | Status: DC

## 2022-05-05 MED ORDER — FAMOTIDINE 20 MG PO TABS
40.0000 mg | ORAL_TABLET | Freq: Once | ORAL | Status: AC
  Administered 2022-05-05: 40 mg via ORAL
  Filled 2022-05-05: qty 2

## 2022-05-05 MED ORDER — ACETAMINOPHEN 500 MG PO TABS
1000.0000 mg | ORAL_TABLET | Freq: Once | ORAL | Status: AC
  Administered 2022-05-05: 1000 mg via ORAL
  Filled 2022-05-05: qty 2

## 2022-05-05 NOTE — MAU Note (Addendum)
...  Anita Allen is a 31 y.o. at [redacted]w[redacted]d here in MAU reporting: Constant upper abdominal cramping that began a few hours ago. She also reports over the past two days she has had an "itchy" throat and reports her mother has had this as well. She reports she feels a sensation to cough but tries not to so that she does not irritate her throat. She also reports over the past week she has also felt hot continually and nothing has been able to cool her down. Last BM this morning. She reports it was normal and she is not constipated. Denies VB or LOF. +FM.  She also endorses lower back pain but reports she has chronic back pain. Patient had her first physical therapy appointment on 12/20 for pelvic pain and back pain. Note in Epic states patient has chronic back pain that has increased in severity during her pregnancy. Patient has therapy appointments each week for twelve weeks.  Onset of complaint: Back pain, chronic. Cramping, this morning. Itchy throat, two days ago. Hot, x1 week.  Pain score:  5/10 back 5/10 lower abdomen  FHT: 151 doppler Lab orders placed from triage:  UA

## 2022-05-05 NOTE — Therapy (Addendum)
OUTPATIENT PHYSICAL THERAPY FEMALE PELVIC TREATMENT   Patient Name: Anita Allen MRN: [id number] DOB:May 21, 1990, 31 y.o., female Today's Date: 05/05/2022  END OF SESSION:  PT End of Session - 05/05/22 1152     Visit Number 2    Date for PT Re-Evaluation 07/22/22    Authorization Type MEDICAID [facility]    PT Start Time 1151    PT Stop Time 1230    PT Time Calculation (min) 39 min    Activity Tolerance Patient tolerated treatment well    Behavior During Therapy WFL for tasks assessed/performed              Past Medical History:  Diagnosis Date   BV (bacterial vaginosis)    Gonorrhea contact, treated    H/O dilation and curettage    Headache    Hx of trichomoniasis    Molar pregnancy 12/2013   Ovarian cyst    PCOS (polycystic ovarian syndrome)    UTI (urinary tract infection)    Vaginal delivery 2012, 2013   Yeast infection    Past Surgical History:  Procedure Laterality Date   brazillian butt lift     DILATION AND CURETTAGE OF UTERUS     DILATION AND EVACUATION N/A 12/20/2013   Procedure: DILATATION AND EVACUATION;  Surgeon: Adam Phenix, MD;  Location: [facility];  Service: Gynecology;  Laterality: N/A;   LIPOSUCTION TRUNK  2021   Patient Active Problem List   Diagnosis Date Noted   Obesity in pregnancy, antepartum 02/26/2022   Supervision of other normal pregnancy, antepartum 02/19/2022   LGSIL on Pap smear of cervix 06/23/2021   Difficulty maintaining weight 06/19/2021   Constipation 06/19/2021   Rash and nonspecific skin eruption 06/19/2021   PCOS (polycystic ovarian syndrome) 06/19/2021   Abnormal uterine bleeding (AUB) 06/13/2021    PCP: No PCP  REFERRING PROVIDER: Brock Bad, MD   REFERRING DIAG: O99.891,M54.9 (ICD-10-CM) - Back pain during pregnancy   THERAPY DIAG:  Other muscle spasm  Other low back pain  Muscle weakness (generalized)  Rationale for Evaluation and Treatment: Rehabilitation  ONSET DATE: 3 weeks it got  worse  SUBJECTIVE:                                                                                                                                                                                           SUBJECTIVE STATEMENT:  Fluid intake: Yes: a lot of water    PAIN:  Are you having pain? Yes NPRS scale: 6/10 Pain location:  low back and can go all the way up to neck  Pain type: discomfort Pain description: constant and but  goes up or down in intensity    Aggravating factors: sitting for a long time or standing - sometimes an hour or less Relieving factors: tylenol extra strength sometimes works  PRECAUTIONS: None  WEIGHT BEARING RESTRICTIONS: No  FALLS:  Has patient fallen in last 6 months? No  LIVING ENVIRONMENT: Lives with:  children Lives in: House/apartment   OCCUPATION: yes sitting - ankles are starting to swell form sitting  PLOF: Independent  PATIENT GOALS: sit and stand and sleep  PERTINENT HISTORY:  3 previous vaginal deliveries, chronic back pain Sexual abuse: No  BOWEL MOVEMENT: Pain with bowel movement: Yes Type of bowel movement:Type (Bristol Stool Scale) 4, Frequency 1 evey 3-4 days, and Strain No Fully empty rectum: No Leakage: No Pads: No Fiber supplement: Yes: fiber in diet  URINATION: Pain with urination: No Fully empty bladder: No Stream: Strong Urgency: Yes:   Frequency: 30 min; 2-3 nocturia Leakage:  No only when sneezing Pads: No  INTERCOURSE: Pain with intercourse:  No   PREGNANCY: Vaginal deliveries 3 Tearing Yes:   C-section deliveries 0 Currently pregnant Yes: 22  PROLAPSE: None   OBJECTIVE:   DIAGNOSTIC FINDINGS:    PATIENT SURVEYS:    PFIQ-7 = 57  COGNITION: Overall cognitive status: Within functional limits for tasks assessed     SENSATION: Light touch: Appears intact Proprioception: Appears intact  MUSCLE LENGTH: Hamstrings: Right WFL  deg; Left WFL deg Thomas test:   LUMBAR SPECIAL TESTS:   Straight leg raise test: Negative  FUNCTIONAL TESTS:  Single leg stand - trendelenburg bil  GAIT:  Comments: short stride length  POSTURE: rounded shoulders, increased lumbar lordosis, and anterior pelvic tilt  PELVIC ALIGNMENT:  LUMBARAROM/PROM:  A/PROM A/PROM  eval  Flexion 80%  Extension WFL   Right lateral flexion WFL   Left lateral flexion WFL   Right rotation 80%  Left rotation 80%   (Blank rows = not tested)  LOWER EXTREMITY ROM:  Passive ROM Right eval Left eval  Hip flexion [facility]   Hip extension    Hip abduction    Hip adduction    Hip internal rotation 75% WFL   Hip external [facility]   Knee flexion    Knee extension    Ankle dorsiflexion    Ankle plantarflexion    Ankle inversion    Ankle eversion     (Blank rows = not tested)  LOWER EXTREMITY MMT:  MMT Right eval Left eval  Hip flexion 5/5 4/5  Hip extension    Hip abduction 4/5 4/5  Hip adduction 5/5 5/5  Hip internal rotation 5/5 5/5  Hip external rotation 5/5 5/5  Knee flexion    Knee extension    Ankle dorsiflexion    Ankle plantarflexion    Ankle inversion    Ankle eversion     PALPATION:   General  lumbar and upper thoracic/cervical paraspinals tender to palpation and some inflammation present                External Perineal Exam mild tenderness of Rt levators , descending perineal body                             Internal Pelvic Floor tender and inflamed levators and obturator left side, tender and tight Rt levators and obturator but not as much tenderness on the Rt side  Patient confirms identification and approves PT to assess internal pelvic floor  and treatment Yes No emotional/communication barriers or cognitive limitation. Patient is motivated to learn. Patient understands and agrees with treatment goals and plan. PT explains patient will be examined in standing, sitting, and lying down to see how their muscles and joints work. When they are ready, they will  be asked to remove their underwear so PT can examine their perineum. The patient is also given the option of providing their own chaperone as one is not provided in our facility. The patient also has the right and is explained the right to defer or refuse any part of the evaluation or treatment including the internal exam. With the patient's consent, PT will use one gloved finger to gently assess the muscles of the pelvic floor, seeing how well it contracts and relaxes and if there is muscle symmetry. After, the patient will get dressed and PT and patient will discuss exam findings and plan of care. PT and patient discuss plan of care, schedule, attendance policy and HEP activities.   PELVIC MMT:   MMT eval  Vaginal 3/5 can hold 20 sec  Internal Anal Sphincter   External Anal Sphincter   Puborectalis   Diastasis Recti   (Blank rows = not tested)        TONE: high  PROLAPSE: none  TODAY'S TREATMENT:                                                                                                                              DATE: 05/05/22                  Manual: STM to lumbar and thoracic in sidelying  Exercises: Standing with foam roll behind back Standing with foam lifting 3 lb Child pose rocking Semi- reclined lifting 3 lb - 10x Hip flexion on step - 10x each Calf stretch - 20 sec Ball roll on mat Ball roll up wall     DATE: 04/29/22  EVAL and initial self care with HEP as seen below - educated and performed today Check all possible CPT codes: 16073 - Self Care    Check all conditions that are expected to impact treatment: Musculoskeletal disorders   If treatment provided at initial evaluation, no treatment charged due to lack of authorization.        PATIENT EDUCATION:  Education details: Access Code: [email or web address] Person educated: Patient Education method: Explanation, Demonstration, Tactile cues, Verbal cues, and Handouts Education comprehension: verbalized  understanding and returned demonstration  HOME EXERCISE PROGRAM: Access Code: [email or web address] URL: [email or web address] Date: 04/29/2022 Prepared by: Dwana Curd  Exercises - Plank with Thoracic Rotation on Counter  - 1 x daily - 7 x weekly - 3 sets - 10 reps - Supine Hip Internal and External Rotation  - 1 x daily - 7 x weekly - 1 sets - 10 reps - 5 sec hold - Supine Butterfly Groin Stretch  - 1 x daily - 7 x weekly - 1 sets -  3 reps - 30 sec hold - Standing 'L' Stretch at [employer]  - 1 x daily - 7 x weekly - 3 sets - 10 reps  ASSESSMENT:  CLINICAL IMPRESSION: Patient did okay with exercises but was very irritable with small movements in thoracic and lumbar spine.  Pt only had minimal pain with rocking in quadruped and sitting semi-reclined.  Pt was given exercise in semi-reclined to work on core strength.  Pt did not report sinificant increase in pain after today's session.  Continue to work on core strength and lengthening of tension in pelvic floor recommended.  OBJECTIVE IMPAIRMENTS: decreased activity tolerance, difficulty walking, decreased ROM, decreased strength, increased fascial restrictions, increased muscle spasms, impaired tone, postural dysfunction, and pain.   ACTIVITY LIMITATIONS: carrying, lifting, sitting, standing, and sleeping  PARTICIPATION LIMITATIONS: community activity and occupation  PERSONAL FACTORS: 1-2 comorbidities: 3 previous vaginal deliveries; chronic back pain  are also affecting patient's functional outcome.   REHAB POTENTIAL: Excellent  CLINICAL DECISION MAKING: Evolving/moderate complexity  EVALUATION COMPLEXITY: Moderate   GOALS: Goals reviewed with patient? Yes    LONG TERM GOALS: Target date: 07/22/2022  Pt will be independent with advanced HEP to maintain improvements made throughout therapy  Baseline:  Goal status: IN PROGRESS  2.  Pt will report 75% reduction of pain due to improvements in posture, strength, and  muscle length  Baseline:  Goal status: IN PROGRESS  3.  Pt will be able to sit and stand for at least 2 hours for improved completion of daily tasks Baseline:  Goal status: IN PROGRESS  4.  Pt will have 5/5 MMT bil LE without pain for imporved standing and walking without trendelenburg Baseline:  Goal status: IN PROGRESS    PLAN:  PT FREQUENCY: 1x/week  PT DURATION: 12 weeks  PLANNED INTERVENTIONS: Therapeutic exercises, Therapeutic activity, Neuromuscular re-education, Balance training, Gait training, Patient/Family education, Self Care, Joint mobilization, Dry Needling, Electrical stimulation, Cryotherapy, Moist heat, Taping, Biofeedback, Manual therapy, and Re-evaluation  PLAN FOR NEXT SESSION: core strength in comfortable positions (semi-reclined) and hip strength; try posterior pelvic floor stretches   Brayton Caves Ruthell Feigenbaum, PT 05/05/2022, 12:53 PM  PHYSICAL THERAPY DISCHARGE SUMMARY  Visits from Start of Care: 2  Current functional level related to goals / functional outcomes: See above   Remaining deficits: See above details   Education / Equipment: HEP   Patient agrees to discharge. Patient goals were not met. Patient is being discharged due to not returning since the last visit.  Russella Dar, PT, DPT 06/01/22 9:56 AM

## 2022-05-05 NOTE — MAU Provider Note (Signed)
History     CSN: [id number]  Arrival date and time: 05/05/22 1751   Event Date/Time   First Provider Initiated Contact with Patient 05/05/22 [phone]      Chief Complaint  Patient presents with   Back Pain   Abdominal Pain   Itchy Throat   Anita Allen is a 31 y.o. [id number] at 75w1dwho presents today with lower back pain and epigastric pain. She states that her doctor gave her muscle relaxers for the back pain, but she does not like taking them because they are too strong. She has just started physical therapy for the back pain. She denies any contractions, VB or  LOF. She has started to feel some fetal movement. She also has a sore throat. She reports that her mom had a cold and she thinks she may be getting that as well. She also reports that she feels hot all the time.   Back Pain This is a new problem. The current episode started yesterday. The problem occurs intermittently. The problem is unchanged. The pain is present in the lumbar spine. The pain does not radiate. The pain is at a severity of 5/10. The pain is Worse during the day. The symptoms are aggravated by standing. Associated symptoms include abdominal pain. Risk factors include pregnancy. She has tried muscle relaxant (physical therapy.) for the symptoms.  Abdominal Pain This is a new problem. The current episode started yesterday. The pain is located in the epigastric region. The pain is at a severity of 5/10. The quality of the pain is cramping. The abdominal pain does not radiate. Nothing aggravates the pain. The pain is relieved by Nothing. She has tried nothing for the symptoms.    OB History     Gravida  7   Para  3   Term  3   Preterm  0   AB  3   Living  3      SAB  1   IAB  2   Ectopic  0   Multiple  0   Live Births  3           Past Medical History:  Diagnosis Date   BV (bacterial vaginosis)    Gonorrhea contact, treated    H/O dilation and curettage    Headache    Hx of  trichomoniasis    Molar pregnancy 12/2013   Ovarian cyst    PCOS (polycystic ovarian syndrome)    UTI (urinary tract infection)    Vaginal delivery 2012, 2013   Yeast infection     Past Surgical History:  Procedure Laterality Date   brazillian butt lift     DILATION AND CURETTAGE OF UTERUS     DILATION AND EVACUATION N/A 12/20/2013   Procedure: DILATATION AND EVACUATION;  Surgeon: JWoodroe Mode MD;  [facility];  Service: Gynecology;  Laterality: N/A;   LIPOSUCTION TRUNK  2021    Family History  Problem Relation Age of Onset   Hypertension Mother    Heart attack Mother    Thyroid disease Mother    Heart disease Father    Hypertension Father    Heart attack Father     Social History   Tobacco Use   Smoking status: Never   Smokeless tobacco: Never  Vaping Use   Vaping Use: Never used  Substance Use Topics   Alcohol use: Not Currently    Comment: not often, not since confirmed pregnancy   Drug use: No  Allergies:  Allergies  Allergen Reactions   Augmentin [Amoxicillin-Pot Clavulanate] Hives and Other (See Comments)    Pt has taken Ceftriaxone in the past (??)   Latex Rash    Vaginal irritation with condoms    Medications Prior to Admission  Medication Sig Dispense Refill Last Dose   Prenat-FeFmCb-DSS-FA-DHA w/o A (CITRANATAL HARMONY) 27-1-260 MG CAPS Take 1 capsule by mouth daily before breakfast. 30 capsule 90 05/04/2022   aspirin 81 MG chewable tablet Chew 1 tablet (81 mg total) by mouth daily. 30 tablet 6    Blood Pressure Monitoring (BLOOD PRESSURE KIT) DEVI 1 kit by Does not apply route once a week. 1 each 0    cyclobenzaprine (FLEXERIL) 10 MG tablet Take 1 tablet (10 mg total) by mouth every 8 (eight) hours as needed for muscle spasms. 30 tablet 1    metroNIDAZOLE (FLAGYL) 500 MG tablet Take 1 tablet (500 mg total) by mouth 2 (two) times daily. 14 tablet 0    Misc. Devices (GOJJI WEIGHT SCALE) MISC 1 Device by Does not apply route every 30 (thirty)  days. 1 each 0    polyethylene glycol powder (GLYCOLAX/MIRALAX) 17 GM/SCOOP powder Take 17 g by mouth daily as needed. 510 g 11     Review of Systems  Gastrointestinal:  Positive for abdominal pain.  Musculoskeletal:  Positive for back pain.  All other systems reviewed and are negative.  Physical Exam   Blood pressure 119/69, pulse 87, temperature 98.3 F (36.8 C), temperature source Oral, resp. rate 15, height _0  (1.549 m), weight 92.7 kg, last menstrual period 12/01/2021, SpO2 100 %.  Physical Exam Constitutional:      Appearance: She is well-developed.  HENT:     Head: Normocephalic.  Eyes:     Pupils: Pupils are equal, round, and reactive to light.  Cardiovascular:     Rate and Rhythm: Normal rate and regular rhythm.     Heart sounds: Normal heart sounds.  Pulmonary:     Effort: Pulmonary effort is normal. No respiratory distress.     Breath sounds: Normal breath sounds.  Abdominal:     Palpations: Abdomen is soft.     Tenderness: There is no abdominal tenderness.  Genitourinary:    Vagina: No bleeding. Vaginal discharge: mucusy.    Comments: External: no lesion Vagina: small amount of white discharge     Musculoskeletal:        General: Normal range of motion.     Cervical back: Normal range of motion and neck supple.  Skin:    General: Skin is warm and dry.  Neurological:     Mental Status: She is alert and oriented to person, place, and time.  Psychiatric:        Mood and Affect: Mood normal.        Behavior: Behavior normal.    FHT: 151 with doppler    Results for orders placed or performed during the hospital encounter of 05/05/22 (from the past 24 hour(s))  Urinalysis, Routine w reflex microscopic Urine, Clean Catch     Status: Abnormal   Collection Time: 05/05/22  9:51 AM  Result Value Ref Range   Color, Urine YELLOW YELLOW   APPearance CLOUDY (A) CLEAR   Specific Gravity, Urine 1.016 1.005 - 1.030   pH 6.0 5.0 - 8.0   Glucose, UA NEGATIVE  NEGATIVE mg/dL   Hgb urine dipstick SMALL (A) NEGATIVE   Bilirubin Urine NEGATIVE NEGATIVE   Ketones, ur NEGATIVE NEGATIVE mg/dL  Protein, ur NEGATIVE NEGATIVE mg/dL   Nitrite NEGATIVE NEGATIVE   Leukocytes,Ua MODERATE (A) NEGATIVE   RBC / HPF 0-5 0 - 5 RBC/hpf   WBC, UA 6-10 0 - 5 WBC/hpf   Bacteria, UA NONE SEEN NONE SEEN   Squamous Epithelial / LPF 21-50 0 - 5   Mucus PRESENT   Resp panel by RT-PCR (RSV, Flu A&B, Covid) Anterior Nasal Swab     Status: None   Collection Time: 05/05/22  9:56 AM   Specimen: Anterior Nasal Swab  Result Value Ref Range   SARS Coronavirus 2 by RT PCR NEGATIVE NEGATIVE   Influenza A by PCR NEGATIVE NEGATIVE   Influenza B by PCR NEGATIVE NEGATIVE   Resp Syncytial Virus by PCR NEGATIVE NEGATIVE     MAU Course  Procedures  MDM 1123: Patient reports that she is feeling better after tylenol and pepcid. She has a PT appt at 11:45 and is requesting DC so she can make it to her PT appt.    Assessment and Plan   1. Supervision of other normal pregnancy, antepartum   2. Back pain affecting pregnancy in second trimester   3. Gastroesophageal reflux disease, unspecified whether esophagitis present   4. [redacted] weeks gestation of pregnancy    DC home in stable condition  2nd Trimester precautions  PTL precautions  Fetal kick counts RX: pepcid 73m qhs #30  Return to MAU as needed FU with OB as planned   [facility].   Specialty: Obstetrics and Gynecology Contact information: [id number], CNM  05/05/22  11:25 AM

## 2022-05-06 LAB — CULTURE, OB URINE

## 2022-05-11 DIAGNOSIS — Z419 Encounter for procedure for purposes other than remedying health state, unspecified: Secondary | ICD-10-CM | POA: Diagnosis not present

## 2022-05-11 NOTE — L&D Delivery Note (Signed)
   Delivery Note:   [id number] at [redacted]w[redacted]d  Admitting diagnosis: Encounter for induction of labor [Z34.90] Risks: A2GDM  PreEclampsia in intrapartum (PCR 0.4)   First Stage:  Induction of labor:09/01/22  Onset of labor: 08/31/22 @ 2109 Augmentation: AROM and Pitocin ROM: AROM 2109 Active labor onset: 2109 Analgesia /Anesthesia/Pain control intrapartum: Epidural   Second Stage:  Complete dilation at 09/01/2022  0239 Onset of pushing at 0240 FHR second stage Cat II. Variables with contractions with quick return to baseline. Moderate variability accels present.    Patient Pushing in lithotomy position with CNM and L&D staff support at bedside. Patient's mother, sister and FOB "Duwayne Heck"  present for birth and supportive.   Delivery of a Live born female  Birth Weight:  PENDING  APGAR: 63, 9  Newborn Delivery   Birth date/time: 09/01/2022 03:13:43 Delivery type: Vaginal, Spontaneous     With great maternal pushing efforts fetal head in cephalic presentation, ROP position and spontaneously restituted to RTO position. Remaining fetal body delivered with ease. Vigorously crying infant placed immediately skin to skin.  Nuchal Cord:  No but cord wrapped around both ankles.    After 7 mins of life cord double clamped after cessation of pulsation, and cut by FOB "Isaiah".  Collection of cord blood for typing completed. Cord blood donation-  Arterial cord blood sample-    Third Stage:  With gentle cord traction and LUS massage Placenta delivered-Spontaneous intact  with 3 vessels . Uterine tone firm bleeding minimal Uterotonics: IV pit bolus initiated  Placenta to L&D for disposal .  None  laceration identified.  Episiotomy:None  Local analgesia: n/a   Repair:n/a  Est. Blood Loss (mL):107.00   Complications: None   Mom to postpartum.  Baby girl "Mihla" to Couplet care / Skin to Skin.  Delivery Report:  Review the Delivery Report for details.    Abshir Paolini Danella Deis) Suzie Portela,  MSN, CNM  [facility]  09/01/22  3:46 AM

## 2022-05-15 NOTE — Therapy (Deleted)
OUTPATIENT PHYSICAL THERAPY FEMALE PELVIC EVALUATION   Patient Name: Anita Allen MRN: [id number] DOB:21-Jun-1990, 32 y.o., female Today's Date: 05/15/2022  END OF SESSION:     Past Medical History:  Diagnosis Date   BV (bacterial vaginosis)    Gonorrhea contact, treated    H/O dilation and curettage    Headache    Hx of trichomoniasis    Molar pregnancy 12/2013   Ovarian cyst    PCOS (polycystic ovarian syndrome)    UTI (urinary tract infection)    Vaginal delivery 2012, 2013   Yeast infection    Past Surgical History:  Procedure Laterality Date   brazillian butt lift     DILATION AND CURETTAGE OF UTERUS     DILATION AND EVACUATION N/A 12/20/2013   Procedure: DILATATION AND EVACUATION;  Surgeon: Woodroe Mode, MD;  Location: [facility];  Service: Gynecology;  Laterality: N/A;   LIPOSUCTION TRUNK  2021   Patient Active Problem List   Diagnosis Date Noted   Obesity in pregnancy, antepartum 02/26/2022   Supervision of other normal pregnancy, antepartum 02/19/2022   LGSIL on Pap smear of cervix 06/23/2021   Difficulty maintaining weight 06/19/2021   Constipation 06/19/2021   Rash and nonspecific skin eruption 06/19/2021   PCOS (polycystic ovarian syndrome) 06/19/2021   Abnormal uterine bleeding (AUB) 06/13/2021    PCP: No PCP  REFERRING PROVIDER: Shelly Bombard, MD   REFERRING DIAG: O99.891,M54.9 (ICD-10-CM) - Back pain during pregnancy   THERAPY DIAG:  No diagnosis found.  Rationale for Evaluation and Treatment: Rehabilitation  ONSET DATE: 3 weeks it got worse  SUBJECTIVE:                                                                                                                                                                                           SUBJECTIVE STATEMENT:  Fluid intake: Yes: a lot of water    PAIN:  Are you having pain? Yes NPRS scale: 6/10 Pain location:  low back and can go all the way up to neck  Pain type: discomfort Pain  description: constant and but goes up or down in intensity    Aggravating factors: sitting for a long time or standing - sometimes an hour or less Relieving factors: tylenol extra strength sometimes works  PRECAUTIONS: None  WEIGHT BEARING RESTRICTIONS: No  FALLS:  Has patient fallen in last 6 months? No  LIVING ENVIRONMENT: Lives with:  children Lives in: House/apartment   OCCUPATION: yes sitting - ankles are starting to swell form sitting  PLOF: Independent  PATIENT GOALS: sit and stand and sleep  PERTINENT HISTORY:  3 previous  vaginal deliveries, chronic back pain Sexual abuse: No  BOWEL MOVEMENT: Pain with bowel movement: Yes Type of bowel movement:Type (Bristol Stool Scale) 4, Frequency 1 evey 3-4 days, and Strain No Fully empty rectum: No Leakage: No Pads: No Fiber supplement: Yes: fiber in diet  URINATION: Pain with urination: No Fully empty bladder: No Stream: Strong Urgency: Yes:   Frequency: 30 min; 2-3 nocturia Leakage:  No only when sneezing Pads: No  INTERCOURSE: Pain with intercourse:  No   PREGNANCY: Vaginal deliveries 3 Tearing Yes:   C-section deliveries 0 Currently pregnant Yes: 22  PROLAPSE: None   OBJECTIVE:   DIAGNOSTIC FINDINGS:    PATIENT SURVEYS:    PFIQ-7 = 57  COGNITION: Overall cognitive status: Within functional limits for tasks assessed     SENSATION: Light touch: Appears intact Proprioception: Appears intact  MUSCLE LENGTH: Hamstrings: Right WFL  deg; Left WFL deg Thomas test:   LUMBAR SPECIAL TESTS:  Straight leg raise test: Negative  FUNCTIONAL TESTS:  Single leg stand - trendelenburg bil  GAIT:  Comments: short stride length  POSTURE: rounded shoulders, increased lumbar lordosis, and anterior pelvic tilt  PELVIC ALIGNMENT:  LUMBARAROM/PROM:  A/PROM A/PROM  eval  Flexion 80%  Extension WFL   Right lateral flexion WFL   Left lateral flexion WFL   Right rotation 80%  Left rotation 80%    (Blank rows = not tested)  LOWER EXTREMITY ROM:  Passive ROM Right eval Left eval  Hip flexion [facility]  [facility]   Hip extension    Hip abduction    Hip adduction    Hip internal rotation 75% WFL   Hip external rotation [facility]  [facility]   Knee flexion    Knee extension    Ankle dorsiflexion    Ankle plantarflexion    Ankle inversion    Ankle eversion     (Blank rows = not tested)  LOWER EXTREMITY MMT:  MMT Right eval Left eval  Hip flexion 5/5 4/5  Hip extension    Hip abduction 4/5 4/5  Hip adduction 5/5 5/5  Hip internal rotation 5/5 5/5  Hip external rotation 5/5 5/5  Knee flexion    Knee extension    Ankle dorsiflexion    Ankle plantarflexion    Ankle inversion    Ankle eversion     PALPATION:   General  lumbar and upper thoracic/cervical paraspinals tender to palpation and some inflammation present                External Perineal Exam mild tenderness of Rt levators , descending perineal body                             Internal Pelvic Floor tender and inflamed levators and obturator left side, tender and tight Rt levators and obturator but not as much tenderness on the Rt side  Patient confirms identification and approves PT to assess internal pelvic floor and treatment Yes No emotional/communication barriers or cognitive limitation. Patient is motivated to learn. Patient understands and agrees with treatment goals and plan. PT explains patient will be examined in standing, sitting, and lying down to see how their muscles and joints work. When they are ready, they will be asked to remove their underwear so PT can examine their perineum. The patient is also given the option of providing their own chaperone as one is not provided in our facility. The patient also has the right and is explained  the right to defer or refuse any part of the evaluation or treatment including the internal exam. With the patient's consent, PT will use one gloved finger to gently assess the muscles of  the pelvic floor, seeing how well it contracts and relaxes and if there is muscle symmetry. After, the patient will get dressed and PT and patient will discuss exam findings and plan of care. PT and patient discuss plan of care, schedule, attendance policy and HEP activities.   PELVIC MMT:   MMT eval  Vaginal 3/5 can hold 20 sec  Internal Anal Sphincter   External Anal Sphincter   Puborectalis   Diastasis Recti   (Blank rows = not tested)        TONE: high  PROLAPSE: none  TODAY'S TREATMENT:                                                                                                                              DATE: 05/05/22                  Manual: STM to lumbar and thoracic in sidelying  Exercises: Standing with foam roll behind back Standing with foam lifting 3 lb Child pose rocking Semi- reclined lifting 3 lb - 10x Hip flexion on step - 10x each Calf stretch - 20 sec Ball roll on mat Ball roll up wall     DATE: 04/29/22  EVAL and initial self care with HEP as seen below - educated and performed today Check all possible CPT codes: 90300 - Self Care    Check all conditions that are expected to impact treatment: Musculoskeletal disorders   If treatment provided at initial evaluation, no treatment charged due to lack of authorization.        PATIENT EDUCATION:  Education details: Access Code: [email or web address] Person educated: Patient Education method: Explanation, Demonstration, Tactile cues, Verbal cues, and Handouts Education comprehension: verbalized understanding and returned demonstration  HOME EXERCISE PROGRAM: Access Code: [email or web address] URL: [email or web address] Date: 04/29/2022 Prepared by: Dwana Curd  Exercises - Plank with Thoracic Rotation on Counter  - 1 x daily - 7 x weekly - 3 sets - 10 reps - Supine Hip Internal and External Rotation  - 1 x daily - 7 x weekly - 1 sets - 10 reps - 5 sec hold - Supine Butterfly Groin Stretch  -  1 x daily - 7 x weekly - 1 sets - 3 reps - 30 sec hold - Standing 'L' Stretch at Counter  - 1 x daily - 7 x weekly - 3 sets - 10 reps  ASSESSMENT:  CLINICAL IMPRESSION: Patient did okay with exercises but was very irritable with small movements in thoracic and lumbar spine.  Pt only had minimal pain with rocking in quadruped and sitting semi-reclined.  Pt was given exercise in semi-reclined to work on core strength.  Pt did not report sinificant increase in pain after today's session.  Continue to work  on core strength and lengthening of tension in pelvic floor recommended.  OBJECTIVE IMPAIRMENTS: decreased activity tolerance, difficulty walking, decreased ROM, decreased strength, increased fascial restrictions, increased muscle spasms, impaired tone, postural dysfunction, and pain.   ACTIVITY LIMITATIONS: carrying, lifting, sitting, standing, and sleeping  PARTICIPATION LIMITATIONS: community activity and occupation  PERSONAL FACTORS: 1-2 comorbidities: 3 previous vaginal deliveries; chronic back pain  are also affecting patient's functional outcome.   REHAB POTENTIAL: Excellent  CLINICAL DECISION MAKING: Evolving/moderate complexity  EVALUATION COMPLEXITY: Moderate   GOALS: Goals reviewed with patient? Yes    LONG TERM GOALS: Target date: 07/22/2022  Pt will be independent with advanced HEP to maintain improvements made throughout therapy  Baseline:  Goal status: IN PROGRESS  2.  Pt will report 75% reduction of pain due to improvements in posture, strength, and muscle length  Baseline:  Goal status: IN PROGRESS  3.  Pt will be able to sit and stand for at least 2 hours for improved completion of daily tasks Baseline:  Goal status: IN PROGRESS  4.  Pt will have 5/5 MMT bil LE without pain for imporved standing and walking without trendelenburg Baseline:  Goal status: IN PROGRESS    PLAN:  PT FREQUENCY: 1x/week  PT DURATION: 12 weeks  PLANNED INTERVENTIONS:  Therapeutic exercises, Therapeutic activity, Neuromuscular re-education, Balance training, Gait training, Patient/Family education, Self Care, Joint mobilization, Dry Needling, Electrical stimulation, Cryotherapy, Moist heat, Taping, Biofeedback, Manual therapy, and Re-evaluation  PLAN FOR NEXT SESSION: core strength in comfortable positions (semi-reclined) and hip strength; try posterior pelvic floor stretches   Jakki L Norabelle Kondo, PT 05/15/2022, 10:20 AM

## 2022-05-17 ENCOUNTER — Other Ambulatory Visit: Payer: Self-pay | Admitting: Obstetrics and Gynecology

## 2022-05-17 ENCOUNTER — Telehealth: Payer: Medicaid Other | Admitting: Family Medicine

## 2022-05-17 ENCOUNTER — Encounter (HOSPITAL_COMMUNITY): Payer: Self-pay | Admitting: Obstetrics & Gynecology

## 2022-05-17 ENCOUNTER — Inpatient Hospital Stay (HOSPITAL_COMMUNITY)
Admission: AD | Admit: 2022-05-17 | Discharge: 2022-05-17 | Disposition: A | Discharge status: Home / Self Care | Payer: Medicaid Other | Attending: Obstetrics & Gynecology | Admitting: Obstetrics & Gynecology

## 2022-05-17 DIAGNOSIS — O98812 Other maternal infectious and parasitic diseases complicating pregnancy, second trimester: Secondary | ICD-10-CM | POA: Diagnosis not present

## 2022-05-17 DIAGNOSIS — N760 Acute vaginitis: Secondary | ICD-10-CM

## 2022-05-17 DIAGNOSIS — B3731 Acute candidiasis of vulva and vagina: Secondary | ICD-10-CM

## 2022-05-17 DIAGNOSIS — O23592 Infection of other part of genital tract in pregnancy, second trimester: Secondary | ICD-10-CM | POA: Insufficient documentation

## 2022-05-17 DIAGNOSIS — B9689 Other specified bacterial agents as the cause of diseases classified elsewhere: Secondary | ICD-10-CM | POA: Insufficient documentation

## 2022-05-17 DIAGNOSIS — Z3A23 23 weeks gestation of pregnancy: Secondary | ICD-10-CM | POA: Insufficient documentation

## 2022-05-17 DIAGNOSIS — B379 Candidiasis, unspecified: Secondary | ICD-10-CM

## 2022-05-17 DIAGNOSIS — O26892 Other specified pregnancy related conditions, second trimester: Secondary | ICD-10-CM | POA: Diagnosis present

## 2022-05-17 DIAGNOSIS — L292 Pruritus vulvae: Secondary | ICD-10-CM

## 2022-05-17 LAB — URINALYSIS, ROUTINE W REFLEX MICROSCOPIC
Bilirubin Urine: NEGATIVE
Glucose, UA: NEGATIVE mg/dL
Ketones, ur: NEGATIVE mg/dL
Nitrite: NEGATIVE
Protein, ur: NEGATIVE mg/dL
Specific Gravity, Urine: 1.009 (ref 1.005–1.030)
pH: 5 (ref 5.0–8.0)

## 2022-05-17 LAB — WET PREP, GENITAL
Sperm: NONE SEEN
Trich, Wet Prep: NONE SEEN
WBC, Wet Prep HPF POC: >=10 — AB (ref <10)

## 2022-05-17 MED ORDER — METRONIDAZOLE 500 MG PO TABS: 500.0000 mg | ORAL_TABLET | Freq: Two times a day (BID) | ORAL | 0 refills | Status: DC

## 2022-05-17 MED ORDER — FLUCONAZOLE 150 MG PO TABS: 150.0000 mg | ORAL_TABLET | Freq: Once | ORAL | 0 refills | Status: DC

## 2022-05-17 MED ORDER — METRONIDAZOLE 1 % EX GEL: Freq: Every day | CUTANEOUS | 0 refills | Status: DC

## 2022-05-17 MED ORDER — TERCONAZOLE 0.4 % VA CREA: 1.0000 | TOPICAL_CREAM | Freq: Every day | VAGINAL | 0 refills | Status: DC

## 2022-05-17 MED ORDER — NYSTATIN 100000 UNIT/GM EX CREA: TOPICAL_CREAM | CUTANEOUS | 0 refills | Status: DC

## 2022-05-17 NOTE — MAU Note (Signed)
Anita Allen is a 32 y.o. at [redacted]w[redacted]d here in MAU reporting: was seen at [facility] (11/30)for ? Bv, did not take rx as prescribed, been a few wks, still has not finished it.  Still having BV symptoms, now has a yeast infection and having cramping  Vag swelling, itching, 'very uncomfortable, esp when she urinates".  Onset of complaint: over a month Pain score: mild cramping/ vag pain 7 when she urinates Vitals:   05/17/22 0953  BP: 117/72  Pulse: 86  Resp: 16  Temp: 98.9 F (37.2 C)  SpO2: 99%     FHT:154 Lab orders placed from triage:  urine collected

## 2022-05-17 NOTE — Progress Notes (Signed)
No charge visit Prescriptions cancelled. Pt instructed to contact her OB for treatment. DWB

## 2022-05-17 NOTE — MAU Provider Note (Signed)
History     CSN: [id number]  Arrival date and time: 05/17/22 0930   Event Date/Time   First Provider Initiated Contact with Patient 05/17/22 1017      Chief Complaint  Patient presents with   Vaginal Discharge   Abdominal Pain   Anita Allen , a  32 y.o. [id number] at [redacted]w[redacted]d presents to MAU with complaints of vaginal irritation swelling and itching for the last week. Patient states she tested positive for  BV back in November and was prescribed Metronidazole. Patient states she took 2 pills then stopped taking it because symptoms improved. Patient states last week she started having an odor and abnormal discharge again and took too more pills but started having vaginal itching and irritation and stopped taking then again. Today she states it "feels swollen " and the itching is getting worse. Last intercourse was 4 days ago and patient reports it was "irritated during and itching afterwards."         OB History     Gravida  7   Para  3   Term  3   Preterm  0   AB  3   Living  3      SAB  1   IAB  2   Ectopic  0   Multiple  0   Live Births  3           Past Medical History:  Diagnosis Date   BV (bacterial vaginosis)    Gonorrhea contact, treated    H/O dilation and curettage    Headache    Hx of trichomoniasis    Molar pregnancy 12/2013   Ovarian cyst    PCOS (polycystic ovarian syndrome)    UTI (urinary tract infection)    Vaginal delivery 2012, 2013   Yeast infection     Past Surgical History:  Procedure Laterality Date   brazillian butt lift     DILATION AND CURETTAGE OF UTERUS     DILATION AND EVACUATION N/A 12/20/2013   Procedure: DILATATION AND EVACUATION;  Surgeon: Woodroe Mode, MD;  Location: [facility];  Service: Gynecology;  Laterality: N/A;   LIPOSUCTION TRUNK  2021    Family History  Problem Relation Age of Onset   Hypertension Mother    Heart attack Mother    Thyroid disease Mother    Heart disease Father    Hypertension  Father    Heart attack Father     Social History   Tobacco Use   Smoking status: Never   Smokeless tobacco: Never  Vaping Use   Vaping Use: Never used  Substance Use Topics   Alcohol use: Not Currently    Comment: not often, not since confirmed pregnancy   Drug use: No    Allergies:  Allergies  Allergen Reactions   Augmentin [Amoxicillin-Pot Clavulanate] Hives and Other (See Comments)    Pt has taken Ceftriaxone in the past (??)   Latex Rash    Vaginal irritation with condoms    Medications Prior to Admission  Medication Sig Dispense Refill Last Dose   aspirin 81 MG chewable tablet Chew 1 tablet (81 mg total) by mouth daily. 30 tablet 6 Past Week   cyclobenzaprine (FLEXERIL) 10 MG tablet Take 1 tablet (10 mg total) by mouth every 8 (eight) hours as needed for muscle spasms. 30 tablet 1 Past Month   metroNIDAZOLE (FLAGYL) 500 MG tablet Take 1 tablet (500 mg total) by mouth 2 (two) times daily. 14 tablet 0  Past Week   polyethylene glycol powder (GLYCOLAX/MIRALAX) 17 GM/SCOOP powder Take 17 g by mouth daily as needed. 510 g 11 Past Month   Prenat-FeFmCb-DSS-FA-DHA w/o A (CITRANATAL HARMONY) 27-1-260 MG CAPS Take 1 capsule by mouth daily before breakfast. 30 capsule 90 05/16/2022   Blood Pressure Monitoring (BLOOD PRESSURE KIT) DEVI 1 kit by Does not apply route once a week. 1 each 0    famotidine (PEPCID) 40 MG tablet Take 0.5 tablets (20 mg total) by mouth at bedtime. 30 tablet 2 Unknown   Misc. Devices (GOJJI WEIGHT SCALE) MISC 1 Device by Does not apply route every 30 (thirty) days. 1 each 0     Review of Systems  Constitutional:  Negative for chills, fatigue and fever.  Eyes:  Negative for pain and visual disturbance.  Respiratory:  Negative for apnea, shortness of breath and wheezing.   Cardiovascular:  Negative for chest pain and palpitations.  Gastrointestinal:  Negative for abdominal pain, constipation, diarrhea, nausea and vomiting.  Genitourinary:  Positive for  vaginal discharge and vaginal pain. Negative for difficulty urinating, dysuria, pelvic pain and vaginal bleeding.  Musculoskeletal:  Negative for back pain.  Neurological:  Negative for seizures, weakness and headaches.  Psychiatric/Behavioral:  Negative for suicidal ideas.    Physical Exam   Blood pressure 121/74, pulse 89, temperature 98.9 F (37.2 C), temperature source Oral, resp. rate 16, height 5\' 1"  (1.549 m), weight 94.1 kg, last menstrual period 12/01/2021, SpO2 99 %.  Physical Exam Vitals and nursing note reviewed. Exam conducted with a chaperone present.  Constitutional:      General: She is not in acute distress.    Appearance: Normal appearance.  HENT:     Head: Normocephalic.  Pulmonary:     Effort: Pulmonary effort is normal.  Abdominal:     Palpations: Abdomen is soft.     Tenderness: There is no abdominal tenderness.  Genitourinary:    Vagina: Vaginal discharge and tenderness present.     Comments: White thickened vaginal discharge noted between the folds of labia and up near clitoral hood. Thin white vaginal discharge noted at vaginal introitus. Redness and irritation observed in vaginal canal with thick clumpy discharge adhered to vaginal walls.   Musculoskeletal:     Cervical back: Normal range of motion.  Skin:    General: Skin is warm and dry.     Capillary Refill: Capillary refill takes less than 2 seconds.  Neurological:     Mental Status: She is alert and oriented to person, place, and time.  Psychiatric:        Mood and Affect: Mood normal.   FHT: 150bpm with moderate variability. No decels noted. (Appropriate for gestational age)  Toco: Quiet  MAU Course  Procedures Orders Placed This Encounter  Procedures   Wet prep, genital   Urinalysis, Routine w reflex microscopic    MDM - Wet Prep positive for Yeast and BV  - UA Reflexed to culture  - GC pending upon discharge.  - Plan for discharge.   Assessment and Plan   1. Yeast infection   2.  Bacterial vaginosis   3. Vulvar itching   4. [redacted] weeks gestation of pregnancy    - Reviewed Yeast and BV results with patient.  - Rx sent to outpatient pharmacy for Metronidazole and Terazole 7. Admin instructions and strong recommendation to use medication as prescribed and complete the course in its entirety. Patient verbalized understanding.  - Nystatin Cream provided for exterior use. - FHT appropriate  for gestational;l age at time of discharge/  - Worsening signs and return precautions reviewed.  - Preterm labor precautions reviewed  - Patient discharged home in stable condition and may return to MAU as needed.    Claudette Head, MSN CNM  05/17/2022, 10:17 AM

## 2022-05-18 ENCOUNTER — Telehealth: Payer: Self-pay | Admitting: Physical Therapy

## 2022-05-18 ENCOUNTER — Ambulatory Visit: Payer: Medicaid Other | Attending: Obstetrics | Admitting: Physical Therapy

## 2022-05-18 DIAGNOSIS — M5459 Other low back pain: Secondary | ICD-10-CM | POA: Insufficient documentation

## 2022-05-18 DIAGNOSIS — O99891 Other specified diseases and conditions complicating pregnancy: Secondary | ICD-10-CM | POA: Insufficient documentation

## 2022-05-18 DIAGNOSIS — M6281 Muscle weakness (generalized): Secondary | ICD-10-CM | POA: Insufficient documentation

## 2022-05-18 DIAGNOSIS — Z3A21 21 weeks gestation of pregnancy: Secondary | ICD-10-CM | POA: Insufficient documentation

## 2022-05-18 DIAGNOSIS — M62838 Other muscle spasm: Secondary | ICD-10-CM | POA: Insufficient documentation

## 2022-05-18 DIAGNOSIS — M549 Dorsalgia, unspecified: Secondary | ICD-10-CM | POA: Insufficient documentation

## 2022-05-18 LAB — GC/CHLAMYDIA PROBE AMP (~~LOC~~) NOT AT ARMC
Chlamydia: NEGATIVE
Comment: NEGATIVE
Comment: NORMAL
Neisseria Gonorrhea: NEGATIVE

## 2022-05-18 NOTE — Telephone Encounter (Signed)
Pt was called due to no show.  She states she tried to cancel with my Chart and it didn't let her cancel.  Pt states she will call back if needing to make further changes  Gustavus Bryant, PT, DPT 05/18/22 8:34 AM

## 2022-05-21 ENCOUNTER — Encounter: Payer: Medicaid Other | Admitting: Advanced Practice Midwife

## 2022-05-26 ENCOUNTER — Ambulatory Visit: Payer: Medicaid Other | Admitting: Physical Therapy

## 2022-05-26 ENCOUNTER — Ambulatory Visit: Payer: Medicaid Other

## 2022-05-27 ENCOUNTER — Encounter: Payer: Self-pay | Admitting: Obstetrics

## 2022-05-27 ENCOUNTER — Ambulatory Visit: Payer: Medicaid Other

## 2022-05-27 ENCOUNTER — Telehealth (INDEPENDENT_AMBULATORY_CARE_PROVIDER_SITE_OTHER): Payer: Medicaid Other | Admitting: Obstetrics

## 2022-05-27 VITALS — BP 116/81 | HR 80

## 2022-05-27 DIAGNOSIS — O99612 Diseases of the digestive system complicating pregnancy, second trimester: Secondary | ICD-10-CM

## 2022-05-27 DIAGNOSIS — K219 Gastro-esophageal reflux disease without esophagitis: Secondary | ICD-10-CM

## 2022-05-27 DIAGNOSIS — K5900 Constipation, unspecified: Secondary | ICD-10-CM

## 2022-05-27 DIAGNOSIS — Z3480 Encounter for supervision of other normal pregnancy, unspecified trimester: Secondary | ICD-10-CM

## 2022-05-27 DIAGNOSIS — Z3A25 25 weeks gestation of pregnancy: Secondary | ICD-10-CM

## 2022-05-27 MED ORDER — DOCUSATE SODIUM 100 MG PO CAPS: 100.0000 mg | ORAL_CAPSULE | Freq: Two times a day (BID) | ORAL | 5 refills | Status: DC

## 2022-05-27 MED ORDER — PANTOPRAZOLE SODIUM 20 MG PO TBEC: 20.0000 mg | DELAYED_RELEASE_TABLET | Freq: Every day | ORAL | 5 refills | Status: DC

## 2022-05-27 NOTE — Progress Notes (Signed)
OBSTETRICS PRENATAL VIRTUAL VISIT ENCOUNTER NOTE  Provider location: [facility]   Patient location: Home  I connected with Anita Allen on 05/27/22 at  3:30 PM EST by MyChart Video Encounter and verified that I am speaking with the correct person using two identifiers. I discussed the limitations, risks, security and privacy concerns of performing an evaluation and management service virtually and the availability of in person appointments. I also discussed with the patient that there may be a patient responsible charge related to this service. The patient expressed understanding and agreed to proceed. Subjective:  Anita Allen is a 32 y.o. [id number] at [redacted]w[redacted]d being seen today for ongoing prenatal care.  She is currently monitored for the following issues for this low-risk pregnancy and has Abnormal uterine bleeding (AUB); Difficulty maintaining weight; Constipation; Rash and nonspecific skin eruption; PCOS (polycystic ovarian syndrome); LGSIL on Pap smear of cervix; Supervision of other normal pregnancy, antepartum; and Obesity in pregnancy, antepartum on their problem list.  Patient reports pelvic pressure, cramping, and heartburn and constipation .  Contractions: Not present. Vag. Bleeding: None.  Movement: Present. Denies any leaking of fluid.   The following portions of the patient's history were reviewed and updated as appropriate: allergies, current medications, past family history, past medical history, past social history, past surgical history and problem list.   Objective:   Vitals:   05/27/22 1456  BP: 116/81  Pulse: 80    Fetal Status:     Movement: Present     General:  Alert, oriented and cooperative. Patient is in no acute distress.  Respiratory: Normal respiratory effort, no problems with respiration noted  Mental Status: Normal mood and affect. Normal behavior. Normal judgment and thought content.  Rest of physical exam deferred due to  type of encounter  Imaging: [place] MFM OB DETAIL +14 WK  Result Date: 04/28/2022 ----------------------------------------------------------------------  OBSTETRICS REPORT                       (Signed Final 04/28/2022 02:18 pm) ---------------------------------------------------------------------- Patient Info  ID #:       [id number]                          D.O.B.:  1990/10/25 (31 yrs)  Name:       Anita Allen                 Visit Date: 04/28/2022 12:51 pm ---------------------------------------------------------------------- Performed By  Attending:        Braxton Feathers DO       Ref. Address:     [address]                                                             [place], [place]  [address]  Performed By:     Reinaldo Raddle            [facility]  Referred By:      Brock Bad MD ---------------------------------------------------------------------- Orders  #  Description                           Code        Ordered By  1  [place] MFM OB DETAIL +14 WK               76811.01    Coral Ceo ----------------------------------------------------------------------  #  Order #                     Accession #                Episode #  1  [id number]                   [id number]                 [id number] ---------------------------------------------------------------------- Indications  Obesity complicating pregnancy, second         O99.212  trimester (BMI 38)  Poor obstetrical history (molar pregnancy)     O09.299  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 Cord Insertion:      Visualized, central  Amniotic Fluid  AFI FV:      Within normal limits  Largest Pocket(cm)                              5.6 ---------------------------------------------------------------------- Biometry  BPD:      51.1  mm     G. Age:  21w 3d         63  %    CI:        72.62   %    70 - 86                                                          FL/HC:      18.2   %    15.9 - 20.3  HC:      190.7  mm     G. Age:  21w 2d         49  %    HC/AC:      1.13        1.06 - 1.25  AC:      169.1  mm     G. Age:  21w 6d         68  %    FL/BPD:     68.1   %  FL:       34.8  mm     G. Age:  21w 0d         35  %    FL/AC:      20.6   %    20 - 24  HUM:      34.2  mm     G. Age:  21w 5d         60  %  CER:      22.4  mm     G. Age:  21w 0d         56  %  LV:          5  mm  CM:        4.5  mm  Est. FW:     426  gm    0 lb 15 oz      62  % ---------------------------------------------------------------------- OB History  Gravidity:    7         Term:   3         SAB:   1  TOP:          2        Living:  3 ---------------------------------------------------------------------- Gestational Age  LMP:           21w 1d        Date:  12/01/21                  EDD:   09/07/22  U/S Today:     21w 3d                                        EDD:   09/05/22  Best:          21w 1d     Det. By:  LMP  (12/01/21)          EDD:  09/07/22 ---------------------------------------------------------------------- Anatomy  Cranium:               Appears normal         Aortic Arch:            Appears normal  Cavum:                 Appears normal         Ductal  Arch:            Appears normal  Ventricles:            Appears normal         Diaphragm:              Appears normal  Choroid Plexus:        Appears normal         Stomach:                Appears normal, left                                                                        sided  Cerebellum:            Appears normal         Abdomen:                Appears normal  Posterior Fossa:       Appears normal         Abdominal Wall:         Appears nml (cord                                                                        insert, abd wall)  Nuchal Fold:           Not applicable (>76    Cord Vessels:           Appears normal ([redacted]                         wks GA)                                        vessel cord)  Face:                  Appears normal         Kidneys:                Appear normal                         (orbits and profile)  Lips:                  Appears normal         Bladder:  Appears normal  Thoracic:              Appears normal         Spine:                  Appears normal  Heart:                 Appears normal         Upper Extremities:      Appears normal                         (4CH, axis, and                         situs)  RVOT:                  Appears normal         Lower Extremities:      Appears normal  LVOT:                  Appears normal  Other:  Heels/feet and open hands/5th digits vis. 3VV/3VTV visualized. Nasal          bone, lenses, maxilla, mandible, falx visualized. Tech difficult due to          maternal habitus and fetal position. Fetus appears to be female. ---------------------------------------------------------------------- Cervix Uterus Adnexa  Cervix  Length:            3.1  cm.  Normal appearance by transabdominal scan.  Uterus  No abnormality visualized.  Right Ovary  Size(cm)     2.71   x   1.47   x  1.18      Vol(ml): 2.46  Within normal limits.  Left Ovary  Size(cm)     2.18   x   2.72   x  1.73      Vol(ml): 5.37  Within normal limits.  Cul De Sac   No free fluid seen.  Adnexa  No adnexal mass visualized. ---------------------------------------------------------------------- Comments  Ms. [place] is at 21w 1d with EDD of 09/07/2022 dated by  LMP  (12/01/21) here for a detailed anatomic survey for  obesity (BMI 33) and molar pregnancy. She had low risk  NIPS and normal AFP.  Sonographic findings  Single intrauterine pregnancy.  Observed fetal cardiac activity.  Transverse, head to maternal right presentation.  Fetal anatomy that was well seen appears normal without  evidence of soft markers. The anatomic survey is complete.  Fetal biometry shows the estimated fetal weight at the 62  percentile.  Amniotic fluid volume: Within normal limits.  Placenta: Anterior. There are multiple placental lakes  throughout the placenta.  Cervix: Normal appearance by transabdominal scan with a  cervical length of 3.1 cm.  Adnexa: No adnexal mass visualized.  Recommendations  - F/u growth in 4 weeks for hx of molar pregnancy and  obesity. If growth is normal we can consider following up as  clinically indicated.  I discussed the limitations of prenatal ultrasound with the  patient including inability to detect certain abnormalities and  poor visualization of certain anatomic structures due to fetal  position, gestational age and maternal body habitus.  The  patient verbalized understanding and had time to ask  questions that were answered to her satisfaction. ----------------------------------------------------------------------                   Charlestine MassedBurk  Epimenio Sarin, DO Electronically Signed Final Report   04/28/2022 02:18 pm ----------------------------------------------------------------------   Assessment and Plan:  Pregnancy: [id number] at [redacted]w[redacted]d  1. Supervision of other normal pregnancy, antepartum  2. Constipation during pregnancy, antepartum Rx: - docusate sodium (COLACE) 100 MG capsule; Take 1 capsule (100 mg total) by mouth 2 (two) times daily.  Dispense: 60 capsule;  Refill: 5  3. GERD without esophagitis Rx: - pantoprazole (PROTONIX) 20 MG tablet; Take 1 tablet (20 mg total) by mouth daily.  Dispense: 60 tablet; Refill: 5    Preterm labor symptoms and general obstetric precautions including but not limited to vaginal bleeding, contractions, leaking of fluid and fetal movement were reviewed in detail with the patient. I discussed the assessment and treatment plan with the patient. The patient was provided an opportunity to ask questions and all were answered. The patient agreed with the plan and demonstrated an understanding of the instructions. The patient was advised to call back or seek an in-person office evaluation/go to MAU at [facility] for any urgent or concerning symptoms. Please refer to After Visit Summary for other counseling recommendations.   I have spent a total of 10 minutes of face-to-face and non-face-to-face time, excluding clinical staff time, reviewing notes and preparing to see patient, ordering tests and/or medications, and counseling the patient.   No follow-ups on file.  Future Appointments  Date Time Provider [place]  05/27/2022  3:30 PM Shelly Bombard, MD [place] None  06/01/2022  9:30 AM Desenglau, Tommy Rainwater, PT [facility] None  06/02/2022  3:30 PM [facility] NURSE [facility]  06/02/2022  3:45 PM [facility] US6 [facility]  06/08/2022 10:15 AM Desenglau, Tommy Rainwater, PT [facility] None  06/18/2022  9:15 AM [facility] None  06/18/2022  9:55 AM Griffin Basil, MD [place] None  06/22/2022 11:00 AM Desenglau, Tommy Rainwater, PT [facility] None  07/02/2022  8:35 AM Johnston Ebbs, NP [place] None    Baltazar Najjar, [place] for [facility], [place], [facility] 05/27/2032

## 2022-05-27 NOTE — Progress Notes (Signed)
Pt c/o increased vaginal pressure and abdominal cramping. Pt states she has been constipation and is requesting Rx for this. Pt reports "hearing a heartbeat in her ears" for 1 week.

## 2022-06-01 ENCOUNTER — Ambulatory Visit: Payer: Medicaid Other | Admitting: Physical Therapy

## 2022-06-02 ENCOUNTER — Ambulatory Visit: Payer: Medicaid Other | Attending: Maternal & Fetal Medicine

## 2022-06-02 ENCOUNTER — Ambulatory Visit: Payer: Medicaid Other

## 2022-06-02 VITALS — BP 106/78 | HR 83

## 2022-06-02 DIAGNOSIS — O09292 Supervision of pregnancy with other poor reproductive or obstetric history, second trimester: Secondary | ICD-10-CM | POA: Diagnosis not present

## 2022-06-02 DIAGNOSIS — E669 Obesity, unspecified: Secondary | ICD-10-CM | POA: Diagnosis not present

## 2022-06-02 DIAGNOSIS — O99212 Obesity complicating pregnancy, second trimester: Secondary | ICD-10-CM | POA: Insufficient documentation

## 2022-06-02 DIAGNOSIS — O09A2 Supervision of pregnancy with history of molar pregnancy, second trimester: Secondary | ICD-10-CM

## 2022-06-02 DIAGNOSIS — Z3480 Encounter for supervision of other normal pregnancy, unspecified trimester: Secondary | ICD-10-CM | POA: Diagnosis present

## 2022-06-02 DIAGNOSIS — Z148 Genetic carrier of other disease: Secondary | ICD-10-CM

## 2022-06-02 DIAGNOSIS — Z362 Encounter for other antenatal screening follow-up: Secondary | ICD-10-CM | POA: Insufficient documentation

## 2022-06-02 DIAGNOSIS — O321XX0 Maternal care for breech presentation, not applicable or unspecified: Secondary | ICD-10-CM | POA: Diagnosis not present

## 2022-06-02 DIAGNOSIS — Z3A26 26 weeks gestation of pregnancy: Secondary | ICD-10-CM | POA: Insufficient documentation

## 2022-06-06 ENCOUNTER — Encounter (HOSPITAL_COMMUNITY): Payer: Self-pay | Admitting: Obstetrics & Gynecology

## 2022-06-06 ENCOUNTER — Other Ambulatory Visit: Payer: Self-pay

## 2022-06-06 ENCOUNTER — Inpatient Hospital Stay (HOSPITAL_COMMUNITY)
Admission: AD | Admit: 2022-06-06 | Discharge: 2022-06-06 | Disposition: A | Discharge status: Home / Self Care | Payer: Medicaid Other | Attending: Obstetrics & Gynecology | Admitting: Obstetrics & Gynecology

## 2022-06-06 DIAGNOSIS — Z711 Person with feared health complaint in whom no diagnosis is made: Secondary | ICD-10-CM | POA: Diagnosis not present

## 2022-06-06 DIAGNOSIS — Z7982 Long term (current) use of aspirin: Secondary | ICD-10-CM | POA: Insufficient documentation

## 2022-06-06 DIAGNOSIS — Z3A26 26 weeks gestation of pregnancy: Secondary | ICD-10-CM | POA: Diagnosis not present

## 2022-06-06 DIAGNOSIS — Z3480 Encounter for supervision of other normal pregnancy, unspecified trimester: Secondary | ICD-10-CM

## 2022-06-06 DIAGNOSIS — R519 Headache, unspecified: Secondary | ICD-10-CM

## 2022-06-06 DIAGNOSIS — O26892 Other specified pregnancy related conditions, second trimester: Secondary | ICD-10-CM

## 2022-06-06 LAB — COMPREHENSIVE METABOLIC PANEL
ALT: 16 U/L (ref 0–44)
AST: 21 U/L (ref 15–41)
Albumin: 2.7 g/dL — ABNORMAL LOW (ref 3.5–5.0)
Alkaline Phosphatase: 99 U/L (ref 38–126)
Anion gap: 7 (ref 5–15)
BUN: <5 mg/dL — ABNORMAL LOW (ref 6–20)
CO2: 21 mmol/L — ABNORMAL LOW (ref 22–32)
Calcium: 10 mg/dL (ref 8.9–10.3)
Chloride: 106 mmol/L (ref 98–111)
Creatinine, Ser: 0.51 mg/dL (ref 0.44–1.00)
GFR, Estimated: >60 mL/min (ref >60)
Glucose, Bld: 84 mg/dL (ref 70–99)
Potassium: 3.5 mmol/L (ref 3.5–5.1)
Sodium: 134 mmol/L — ABNORMAL LOW (ref 135–145)
Total Bilirubin: 0.6 mg/dL (ref 0.3–1.2)
Total Protein: 5.8 g/dL — ABNORMAL LOW (ref 6.5–8.1)

## 2022-06-06 LAB — CBC WITH DIFFERENTIAL/PLATELET
Abs Immature Granulocytes: 0.02 10*3/uL (ref 0.00–0.07)
Basophils Absolute: 0.1 10*3/uL (ref 0.0–0.1)
Basophils Relative: 1 %
Eosinophils Absolute: 0.1 10*3/uL (ref 0.0–0.5)
Eosinophils Relative: 1 %
HCT: 36.8 % (ref 36.0–46.0)
Hemoglobin: 12.2 g/dL (ref 12.0–15.0)
Immature Granulocytes: 0 %
Lymphocytes Relative: 21 %
Lymphs Abs: 2 10*3/uL (ref 0.7–4.0)
MCH: 29.2 pg (ref 26.0–34.0)
MCHC: 33.2 g/dL (ref 30.0–36.0)
MCV: 88 fL (ref 80.0–100.0)
Monocytes Absolute: 0.7 10*3/uL (ref 0.1–1.0)
Monocytes Relative: 7 %
Neutro Abs: 7 10*3/uL (ref 1.7–7.7)
Neutrophils Relative %: 70 %
Platelets: 269 10*3/uL (ref 150–400)
RBC: 4.18 MIL/uL (ref 3.87–5.11)
RDW: 13.2 % (ref 11.5–15.5)
WBC: 9.9 10*3/uL (ref 4.0–10.5)
nRBC: 0 % (ref 0.0–0.2)

## 2022-06-06 LAB — URINALYSIS, ROUTINE W REFLEX MICROSCOPIC
Bilirubin Urine: NEGATIVE
Glucose, UA: NEGATIVE mg/dL
Hgb urine dipstick: NEGATIVE
Ketones, ur: NEGATIVE mg/dL
Nitrite: NEGATIVE
Protein, ur: NEGATIVE mg/dL
Specific Gravity, Urine: 1.006 (ref 1.005–1.030)
pH: 6 (ref 5.0–8.0)

## 2022-06-06 MED ORDER — ACETAMINOPHEN-CAFFEINE 500-65 MG PO TABS
1.0000 | ORAL_TABLET | Freq: Once | ORAL | Status: AC
  Administered 2022-06-06: 1 via ORAL
  Filled 2022-06-06: qty 1

## 2022-06-06 NOTE — Progress Notes (Signed)
Patient requesting to not wait on lab results and be discharged home. HA improved with medication. Anita Allen CNM ok with discharging patient home. Patient stated she will return if needed based off of lab results.

## 2022-06-06 NOTE — MAU Note (Signed)
Anita Allen is a 32 y.o. at [redacted]w[redacted]d here in MAU reporting: she's here for BP evaluation and HA.  Reports has had HA since last night, but hasn't taken any meds to treat other than baby ASA.  Denies visual disturbances and epigastric pain.  Reports BP's taken @ home since last night were 125/89, 131/89, 130/77, 135/72, 113/94, and 118/89. Denies VB or LOF.  Endorses +FM. LMP: NA Onset of complaint: yesterday Pain score: 6 Vitals:   06/06/22 1154  BP: 121/69  Pulse: 84  Resp: 19  Temp: 98.4 F (36.9 C)  SpO2: 100%     FHT:152 bpm Lab orders placed from triage:   UA

## 2022-06-06 NOTE — MAU Provider Note (Cosign Needed Addendum)
History     CSN: [id number]  Arrival date and time: 06/06/22 1135   Event Date/Time   First Provider Initiated Contact with Patient 06/06/22 1237      Chief Complaint  Patient presents with   Headache   BP Evaluation   Anita Allen , a  32 y.o. [id number] at [redacted]w[redacted]d presents to MAU with complaints of and on-going headache since last night. She states headache is one-sided and currently rates pain a 6/10. She denies attempting to relieve the headache but states she took her baby Asprin this morning.  Patient reports that she has been "hearing her heart beat in her ears" and was concerned that with her headache that her BP was high. Patient states she took her BP last night at its was 116-120/-60-89. This morning patient states she had a 119/89 and decided to come to MAU. She denies visual changes and epigastric pain. She endorses some mild pedal edema that resolves with rest and elevation. She denies vaginal bleeding, leaking of fluid and, endorses positive fetal movement.          OB History     Gravida  7   Para  3   Term  3   Preterm  0   AB  3   Living  3      SAB  1   IAB  2   Ectopic  0   Multiple  0   Live Births  3           Past Medical History:  Diagnosis Date   BV (bacterial vaginosis)    Gonorrhea contact, treated    H/O dilation and curettage    Headache    Hx of trichomoniasis    Molar pregnancy 12/2013   Ovarian cyst    PCOS (polycystic ovarian syndrome)    UTI (urinary tract infection)    Vaginal delivery 2012, 2013   Yeast infection     Past Surgical History:  Procedure Laterality Date   brazillian butt lift     DILATION AND CURETTAGE OF UTERUS     DILATION AND EVACUATION N/A 12/20/2013   Procedure: DILATATION AND EVACUATION;  Surgeon: Woodroe Mode, MD;  Location: [facility];  Service: Gynecology;  Laterality: N/A;   LIPOSUCTION TRUNK  2021    Family History  Problem Relation Age of Onset   Hypertension Mother    Heart  attack Mother    Thyroid disease Mother    Heart disease Father    Hypertension Father    Heart attack Father     Social History   Tobacco Use   Smoking status: Never   Smokeless tobacco: Never  Vaping Use   Vaping Use: Never used  Substance Use Topics   Alcohol use: Not Currently    Comment: not often, not since confirmed pregnancy   Drug use: No    Allergies:  Allergies  Allergen Reactions   Augmentin [Amoxicillin-Pot Clavulanate] Hives and Other (See Comments)    Pt has taken Ceftriaxone in the past (??)   Latex Rash    Vaginal irritation with condoms    Medications Prior to Admission  Medication Sig Dispense Refill Last Dose   aspirin 81 MG chewable tablet Chew 1 tablet (81 mg total) by mouth daily. 30 tablet 6 06/06/2022   cyclobenzaprine (FLEXERIL) 10 MG tablet Take 1 tablet (10 mg total) by mouth every 8 (eight) hours as needed for muscle spasms. 30 tablet 1 Past Month   docusate  sodium (COLACE) 100 MG capsule Take 1 capsule (100 mg total) by mouth 2 (two) times daily. 60 capsule 5 Past Month   Prenat-FeFmCb-DSS-FA-DHA w/o A (CITRANATAL HARMONY) 27-1-260 MG CAPS Take 1 capsule by mouth daily before breakfast. 30 capsule 90 06/06/2022   Blood Pressure Monitoring (BLOOD PRESSURE KIT) DEVI 1 kit by Does not apply route once a week. 1 each 0    famotidine (PEPCID) 40 MG tablet Take 0.5 tablets (20 mg total) by mouth at bedtime. 30 tablet 2 Unknown   metroNIDAZOLE (FLAGYL) 500 MG tablet Take 1 tablet (500 mg total) by mouth 2 (two) times daily. (Patient not taking: Reported on 05/27/2022) 14 tablet 0    metroNIDAZOLE (FLAGYL) 500 MG tablet Take 1 tablet (500 mg total) by mouth 2 (two) times daily. (Patient not taking: Reported on 06/02/2022) 14 tablet 0    Misc. Devices (GOJJI WEIGHT SCALE) MISC 1 Device by Does not apply route every 30 (thirty) days. 1 each 0    nystatin cream (MYCOSTATIN) Apply to affected area 2 times daily (Patient not taking: Reported on 05/27/2022) 30 g 0     pantoprazole (PROTONIX) 20 MG tablet Take 1 tablet (20 mg total) by mouth daily. 60 tablet 5 Unknown   polyethylene glycol powder (GLYCOLAX/MIRALAX) 17 GM/SCOOP powder Take 17 g by mouth daily as needed. (Patient not taking: Reported on 05/27/2022) 510 g 11    terconazole (TERAZOL 7) 0.4 % vaginal cream Place 1 applicator vaginally at bedtime. Use for seven days (Patient not taking: Reported on 05/27/2022) 45 g 0     Review of Systems  Constitutional:  Negative for chills, fatigue and fever.  Eyes:  Negative for pain and visual disturbance.  Respiratory:  Negative for apnea, shortness of breath and wheezing.   Cardiovascular:  Negative for chest pain and palpitations.  Gastrointestinal:  Negative for abdominal pain, constipation, diarrhea, nausea and vomiting.  Genitourinary:  Negative for difficulty urinating, dysuria, pelvic pain, vaginal bleeding, vaginal discharge and vaginal pain.  Musculoskeletal:  Negative for back pain.  Neurological:  Positive for headaches. Negative for seizures and weakness.  Psychiatric/Behavioral:  Negative for suicidal ideas.    Physical Exam   Blood pressure 104/65, pulse 84, temperature 98.4 F (36.9 C), temperature source Oral, resp. rate 18, height 5\' 2"  (1.575 m), weight 95.3 kg, last menstrual period 12/01/2021, SpO2 100 %.  Physical Exam Vitals and nursing note reviewed.  Constitutional:      General: She is not in acute distress.    Appearance: Normal appearance.  HENT:     Head: Normocephalic.  Cardiovascular:     Rate and Rhythm: Normal rate and regular rhythm.  Pulmonary:     Effort: Pulmonary effort is normal.  Musculoskeletal:     Cervical back: Normal range of motion.     Comments: Mild non-pitting swelling noted to both feet. Patient reports they have improved since arrival to MAU.   Skin:    General: Skin is warm and dry.  Neurological:     Mental Status: She is alert and oriented to person, place, and time.     GCS: GCS eye  subscore is 4. GCS verbal subscore is 5. GCS motor subscore is 6.     Deep Tendon Reflexes: Reflexes normal.  Psychiatric:        Mood and Affect: Mood normal.    FHT:150 bpm with moderate variability. (Appropriate for gestational age.)  Toco: Quiet  MAU Course  Procedures Orders Placed This Encounter  Procedures  Urinalysis, Routine w reflex microscopic -Urine, Clean Catch   CBC with Differential/Platelet   Comprehensive metabolic panel   Protein / creatinine ratio, urine   Discharge patient   Meds ordered this encounter  Medications   acetaminophen-caffeine (EXCEDRIN TENSION HEADACHE) 500-65 MG per tablet 1 tablet   Results for orders placed or performed during the hospital encounter of 06/06/22 (from the past 24 hour(s))  Urinalysis, Routine w reflex microscopic -Urine, Clean Catch     Status: Abnormal   Collection Time: 06/06/22 12:04 PM  Result Value Ref Range   Color, Urine YELLOW YELLOW   APPearance HAZY (A) CLEAR   Specific Gravity, Urine 1.006 1.005 - 1.030   pH 6.0 5.0 - 8.0   Glucose, UA NEGATIVE NEGATIVE mg/dL   Hgb urine dipstick NEGATIVE NEGATIVE   Bilirubin Urine NEGATIVE NEGATIVE   Ketones, ur NEGATIVE NEGATIVE mg/dL   Protein, ur NEGATIVE NEGATIVE mg/dL   Nitrite NEGATIVE NEGATIVE   Leukocytes,Ua TRACE (A) NEGATIVE   RBC / HPF 0-5 0 - 5 RBC/hpf   WBC, UA 11-20 0 - 5 WBC/hpf   Bacteria, UA MANY (A) NONE SEEN   Squamous Epithelial / HPF 11-20 0 - 5 /HPF  CBC with Differential/Platelet     Status: None   Collection Time: 06/06/22 12:20 PM  Result Value Ref Range   WBC 9.9 4.0 - 10.5 K/uL   RBC 4.18 3.87 - 5.11 MIL/uL   Hemoglobin 12.2 12.0 - 15.0 g/dL   HCT 36.8 36.0 - 46.0 %   MCV 88.0 80.0 - 100.0 fL   MCH 29.2 26.0 - 34.0 pg   MCHC 33.2 30.0 - 36.0 g/dL   RDW 13.2 11.5 - 15.5 %   Platelets 269 150 - 400 K/uL   nRBC 0.0 0.0 - 0.2 %   Neutrophils Relative % 70 %   Neutro Abs 7.0 1.7 - 7.7 K/uL   Lymphocytes Relative 21 %   Lymphs Abs 2.0 0.7 -  4.0 K/uL   Monocytes Relative 7 %   Monocytes Absolute 0.7 0.1 - 1.0 K/uL   Eosinophils Relative 1 %   Eosinophils Absolute 0.1 0.0 - 0.5 K/uL   Basophils Relative 1 %   Basophils Absolute 0.1 0.0 - 0.1 K/uL   Immature Granulocytes 0 %   Abs Immature Granulocytes 0.02 0.00 - 0.07 K/uL  Comprehensive metabolic panel     Status: Abnormal   Collection Time: 06/06/22 12:20 PM  Result Value Ref Range   Sodium 134 (L) 135 - 145 mmol/L   Potassium 3.5 3.5 - 5.1 mmol/L   Chloride 106 98 - 111 mmol/L   CO2 21 (L) 22 - 32 mmol/L   Glucose, Bld 84 70 - 99 mg/dL   BUN <5 (L) 6 - 20 mg/dL   Creatinine, Ser 0.51 0.44 - 1.00 mg/dL   Calcium 10.0 8.9 - 10.3 mg/dL   Total Protein 5.8 (L) 6.5 - 8.1 g/dL   Albumin 2.7 (L) 3.5 - 5.0 g/dL   AST 21 15 - 41 U/L   ALT 16 0 - 44 U/L   Alkaline Phosphatase 99 38 - 126 U/L   Total Bilirubin 0.6 0.3 - 1.2 mg/dL   GFR, Estimated >60 >60 mL/min   Anion gap 7 5 - 15   Patient Vitals for the past 24 hrs:  BP Temp Temp src Pulse Resp SpO2 Height Weight  06/06/22 1301 (!) 111/46 -- -- 80 18 -- -- --  06/06/22 1246 113/66 -- -- 84 -- -- -- --  06/06/22 1231 104/65 -- -- 84 -- -- -- --  06/06/22 1212 124/70 -- -- 89 18 -- -- --  06/06/22 1154 121/69 98.4 F (36.9 C) Oral 84 19 100 % -- --  06/06/22 1147 -- -- -- -- -- -- 5\' 2"  (1.575 m) 95.3 kg    MDM - Headache improving with PO Excedrin. From 6-->4.  - CBC and CMP normal for pregnancy  BPs normal while in MAU. Low suspicion for PreE.  - Patient would like to be discharged home as her headache has improve and her BPs are normal here in MAU. Counseled patient on waiting for labs for final decision. Pt desires to leave.  - Given stability of patient presentation and current clinical improvement with Medication, patient discharged. CNM counseled patient on the importance of returning to MAU if necessary and reviewed worsening signs and return precautions.  - Patient verbalized understanding.   Assessment  and Plan   1. Pregnancy headache in second trimester   2. Supervision of other normal pregnancy, antepartum   3. Physically well but worried   4. [redacted] weeks gestation of pregnancy    - PreE precautions reviewed in depth prior to patient departure.  - Reviewed OTC options for headaches that are safe in pregnancy. - Preterm labor precautions reviewed.  - FHT appropriate for gestational age at time of discharge.  - Patient discharged home in stable condition and may return to MAU as needed.   , MSN CNM  06/06/2022, 12:37 PM

## 2022-06-08 ENCOUNTER — Encounter: Payer: Medicaid Other | Admitting: Physical Therapy

## 2022-06-11 DIAGNOSIS — Z419 Encounter for procedure for purposes other than remedying health state, unspecified: Secondary | ICD-10-CM | POA: Diagnosis not present

## 2022-06-18 ENCOUNTER — Ambulatory Visit (INDEPENDENT_AMBULATORY_CARE_PROVIDER_SITE_OTHER): Payer: Medicaid Other | Admitting: Obstetrics and Gynecology

## 2022-06-18 ENCOUNTER — Other Ambulatory Visit: Payer: Medicaid Other

## 2022-06-18 VITALS — BP 126/78 | HR 89 | Wt 212.0 lb

## 2022-06-18 DIAGNOSIS — Z3A28 28 weeks gestation of pregnancy: Secondary | ICD-10-CM

## 2022-06-18 DIAGNOSIS — Z3483 Encounter for supervision of other normal pregnancy, third trimester: Secondary | ICD-10-CM

## 2022-06-18 DIAGNOSIS — Z3480 Encounter for supervision of other normal pregnancy, unspecified trimester: Secondary | ICD-10-CM | POA: Diagnosis not present

## 2022-06-18 MED ORDER — VITAFOL ULTRA 29-0.6-0.4-200 MG PO CAPS: 1.0000 | ORAL_CAPSULE | Freq: Every day | ORAL | 11 refills | Status: DC

## 2022-06-18 NOTE — Progress Notes (Signed)
Pt complains of increase pelvic pressure, constipation.

## 2022-06-18 NOTE — Progress Notes (Signed)
   PRENATAL VISIT NOTE  Subjective:  Anita Allen is a 32 y.o. [phone] at [redacted]w[redacted]d being seen today for ongoing prenatal care.  She is currently monitored for the following issues for this low-risk pregnancy and has Abnormal uterine bleeding (AUB); Difficulty maintaining weight; Constipation; Rash and nonspecific skin eruption; PCOS (polycystic ovarian syndrome); LGSIL on Pap smear of cervix; Supervision of other normal pregnancy, antepartum; and Obesity in pregnancy, antepartum on their problem list.  Patient doing well with no acute concerns today. She reports no complaints.  Contractions: Not present. Vag. Bleeding: None.  Movement: Present. Denies leaking of fluid.   Patient counseled regarding bilateral tubal ligation. Reviewed that this is a permanent procedure and that she will not be able to have children after it is done. Reviewed risks of bilateral tubal ligation including infection, hemorrhage, damage to surrounding tissue and organs, risk of regret. Reviewed that bilateral tubal ligation is not 100% effective and she should take a pregnancy test if she believes for any reason she may be pregnant. Reviewed slightly increased risk of ectopic pregnancy and need to seek care if she becomes pregnant. She understands this is an elective procedure and again affirms her desire. Consent signed.  The following portions of the patient's history were reviewed and updated as appropriate: allergies, current medications, past family history, past medical history, past social history, past surgical history and problem list. Problem list updated.  Objective:   Vitals:   06/18/22 0948  BP: 126/78  Pulse: 89  Weight: 212 lb (96.2 kg)    Fetal Status: Fetal Heart Rate (bpm): 155 (Simultaneous filing. User may not have seen previous data.)   Movement: Present     General:  Alert, oriented and cooperative. Patient is in no acute distress.  Skin: Skin is warm and dry. No rash noted.   Cardiovascular:  Normal heart rate noted  Respiratory: Normal respiratory effort, no problems with respiration noted  Abdomen: Soft, gravid, appropriate for gestational age.  Pain/Pressure: Present     Pelvic: Cervical exam deferred        Extremities: Normal range of motion.     Mental Status:  Normal mood and affect. Normal behavior. Normal judgment and thought content.   Assessment and Plan:  Pregnancy: [id number] at [redacted]w[redacted]d  1. [redacted] weeks gestation of pregnancy   2. Supervision of other normal pregnancy, antepartum Continue routine prenatal care  Pt desires BTL, information given regarding different types of tubal ligation, pt is leaning toward interval laparoscopic, information given and consent signed today.  - Glucose Tolerance, 2 Hours w/1 Hour - HIV Antibody (routine testing w rflx) - CBC - RPR - Prenat-Fe Poly-Methfol-FA-DHA (VITAFOL ULTRA) 29-0.6-0.4-200 MG CAPS; Take 1 capsule by mouth daily.  Dispense: 30 capsule; Refill: 11  Preterm labor symptoms and general obstetric precautions including but not limited to vaginal bleeding, contractions, leaking of fluid and fetal movement were reviewed in detail with the patient.  Please refer to After Visit Summary for other counseling recommendations.   Return in about 2 weeks (around 07/02/2022) for ROB.   Lynnda Shields, MD Faculty Attending [facility]

## 2022-06-19 LAB — CBC
Hematocrit: 38.6 % (ref 34.0–46.6)
Hemoglobin: 12.5 g/dL (ref 11.1–15.9)
MCH: 28.2 pg (ref 26.6–33.0)
MCHC: 32.4 g/dL (ref 31.5–35.7)
MCV: 87 fL (ref 79–97)
Platelets: 294 10*3/uL (ref 150–450)
RBC: 4.43 x10E6/uL (ref 3.77–5.28)
RDW: 12.5 % (ref 11.7–15.4)
WBC: 10.8 10*3/uL (ref 3.4–10.8)

## 2022-06-19 LAB — RPR: RPR Ser Ql: NONREACTIVE

## 2022-06-19 LAB — GLUCOSE TOLERANCE, 2 HOURS W/ 1HR
Glucose, 1 hour: 72 mg/dL (ref 70–179)
Glucose, 2 hour: 102 mg/dL (ref 70–152)
Glucose, Fasting: 149 mg/dL — ABNORMAL HIGH (ref 70–91)

## 2022-06-19 LAB — HIV ANTIBODY (ROUTINE TESTING W REFLEX): HIV Screen 4th Generation wRfx: NONREACTIVE

## 2022-06-22 ENCOUNTER — Encounter: Payer: Self-pay | Admitting: Obstetrics and Gynecology

## 2022-06-22 ENCOUNTER — Other Ambulatory Visit: Payer: Self-pay

## 2022-06-22 ENCOUNTER — Encounter: Payer: Medicaid Other | Admitting: Physical Therapy

## 2022-06-22 DIAGNOSIS — O24410 Gestational diabetes mellitus in pregnancy, diet controlled: Secondary | ICD-10-CM

## 2022-06-22 MED ORDER — ACCU-CHEK GUIDE VI STRP: ORAL_STRIP | 12 refills | Status: DC

## 2022-06-22 MED ORDER — ACCU-CHEK SOFTCLIX LANCETS MISC: 12 refills | Status: DC

## 2022-06-22 MED ORDER — ACCU-CHEK GUIDE W/DEVICE KIT: 1.0000 | PACK | Freq: Four times a day (QID) | 0 refills | Status: DC

## 2022-06-22 NOTE — Progress Notes (Signed)
Patient notified GDM. Explained in great detail. Referral and supplies sent. Pt agrees and has no further questions.

## 2022-07-02 ENCOUNTER — Encounter: Payer: Self-pay | Admitting: Student

## 2022-07-02 ENCOUNTER — Ambulatory Visit (INDEPENDENT_AMBULATORY_CARE_PROVIDER_SITE_OTHER): Payer: Medicaid Other | Admitting: Student

## 2022-07-02 DIAGNOSIS — O24410 Gestational diabetes mellitus in pregnancy, diet controlled: Secondary | ICD-10-CM

## 2022-07-02 DIAGNOSIS — Z3A30 30 weeks gestation of pregnancy: Secondary | ICD-10-CM

## 2022-07-02 DIAGNOSIS — O99891 Other specified diseases and conditions complicating pregnancy: Secondary | ICD-10-CM

## 2022-07-02 DIAGNOSIS — Z3480 Encounter for supervision of other normal pregnancy, unspecified trimester: Secondary | ICD-10-CM

## 2022-07-02 DIAGNOSIS — M549 Dorsalgia, unspecified: Secondary | ICD-10-CM

## 2022-07-02 NOTE — Progress Notes (Deleted)
   PRENATAL VISIT NOTE  Subjective:  Anita Allen is a 32 y.o. [phone] at 17w3dbeing seen today for ongoing prenatal care.  She is currently monitored for the following issues for this low-risk pregnancy and has Abnormal uterine bleeding (AUB); Difficulty maintaining weight; Constipation; Rash and nonspecific skin eruption; PCOS (polycystic ovarian syndrome); LGSIL on Pap smear of cervix; Supervision of other normal pregnancy, antepartum; and Obesity in pregnancy, antepartum on their problem list.  Patient reports {[id number]}.   .  .   . Denies leaking of fluid.   The following portions of the patient's history were reviewed and updated as appropriate: allergies, current medications, past family history, past medical history, past social history, past surgical history and problem list.   Objective:  There were no vitals filed for this visit.  Fetal Status:           General:  Alert, oriented and cooperative. Patient is in no acute distress.  Skin: Skin is warm and dry. No rash noted.   Cardiovascular: Normal heart rate noted  Respiratory: Normal respiratory effort, no problems with respiration noted  Abdomen: Soft, gravid, appropriate for gestational age.        Pelvic: Cervical exam deferred        Extremities: Normal range of motion.     Mental Status: Normal mood and affect. Normal behavior. Normal judgment and thought content.   Assessment and Plan:  Pregnancy: [id number] 354w3d. Supervision of other normal pregnancy, antepartum - doing well, vigorous and frequent fetal movement  2. [redacted] weeks gestation of pregnancy - continue routine follow-up  3. Diet controlled gestational diabetes mellitus (GDM) in third trimester - growth USKoreardered  Preterm labor symptoms and general obstetric precautions including but not limited to vaginal bleeding, contractions, leaking of fluid and fetal movement were reviewed in detail with the patient. Please refer to After Visit Summary for  other counseling recommendations.   No follow-ups on file.  Future Appointments  Date Time Provider DePoint Clear2/22/2024  8:35 AM FoJohnston EbbsNP C[facility]rmstrongone  07/08/2022  2:45 PM NDSuffolkDM CLASS NDNorth San [place]DM    NiJohnston EbbsNP

## 2022-07-02 NOTE — Progress Notes (Signed)
Pt presents for virtual [place] visit. Pt c/o pelvic pain, back pain and leg pain that makes it hard to sleep and walk. Pt want to see a chiropractor for this pain. Pt states she tried PT in the past that worsen symptoms.

## 2022-07-02 NOTE — Progress Notes (Signed)
OBSTETRICS PRENATAL VIRTUAL VISIT ENCOUNTER NOTE  Provider location: [facility] [place] [facility]   Patient location: Home  I connected with Anita Allen on 07/02/22 at  8:35 AM EST by MyChart Video Encounter and verified that I am speaking with the correct person using two identifiers. I discussed the limitations, risks, security and privacy concerns of performing an evaluation and management service virtually and the availability of in person appointments. I also discussed with the patient that there may be a patient responsible charge related to this service. The patient expressed understanding and agreed to proceed. Subjective:  Anita Allen is a 32 y.o. [id number] at 76w3dbeing seen today for ongoing prenatal care.  She is currently monitored for the following issues for this low-risk pregnancy and has Abnormal uterine bleeding (AUB); Difficulty maintaining weight; Constipation; Rash and nonspecific skin eruption; PCOS (polycystic ovarian syndrome); LGSIL on Pap smear of cervix; Supervision of other normal pregnancy, antepartum; and Obesity in pregnancy, antepartum on their problem list.  Patient reports backache and pelvic pain . Patient has tried PT, flexeril, and tylenol without relief. Has not been able to obtain a pregnancy support belt at this time. Reports an achy pain at night time that alternates from her right to left lower pelvic region and is impacting her sleep. Does not desire any additional pharmacological support. Reports positive fetal movement and no contractions. Denies any leaking of fluid or vaginal bleeding.  The following portions of the patient's history were reviewed and updated as appropriate: allergies, current medications, past family history, past medical history, past social history, past surgical history and problem list.   Objective:  There were no vitals filed for this visit.  Fetal Status:   General:  Alert, oriented and cooperative.  Patient is in no acute distress.  Respiratory: Normal respiratory effort, no problems with respiration noted  Mental Status: Normal mood and affect. Normal behavior. Normal judgment and thought content.  Rest of physical exam deferred due to type of encounter  Imaging: UKoreaMFM OB FOLLOW UP  Result Date: 06/02/2022 ----------------------------------------------------------------------  OBSTETRICS REPORT                       (Signed Final 06/02/2022 04:28 pm) ---------------------------------------------------------------------- Patient Info  ID #:       [id number]                         D.O.B.:  1992(31 yrs)  Name:       Anita Allen                Visit Date: 06/02/2022 03:49 pm ---------------------------------------------------------------------- Performed By  Attending:        BValeda MalmDO       Ref. Address:     [address] [place], [place]  Performed By:     Nathen May       [facility]                                                             [facility]                                                             [facility]  Referred By:      Shelly Bombard MD ---------------------------------------------------------------------- Orders  #  Description                           Code        Ordered By  1  [place] MFM OB FOLLOW UP                   [id number]    Valeda Malm ----------------------------------------------------------------------  #  Order #                     Accession #                Episode #  1  [id number]                   [id number]                 [id number] ---------------------------------------------------------------------- Indications  Obesity  complicating pregnancy, second         O99.212  trimester (pregravid BMI 33)  Poor obstetrical history (molar pregnancy)     O09.299  Encounter for other antenatal screening        Z36.2  follow-up  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 Cord Insertion:      Previously visualized  Amniotic Fluid  AFI FV:      Within normal limits                              Largest Pocket(cm)                              6.9 ---------------------------------------------------------------------- Biometry  BPD:      67.4  mm     G. Age:  27w 1d         74  %    CI:         73.7   %    70 - 86                                                          FL/HC:      19.9   %    18.6 - 20.4  HC:      249.4  mm     G. Age:  27w 1d         58  %    HC/AC:      1.11        1.04 - 1.22  AC:      224.2  mm     G. Age:  26w 6d         63  %    FL/BPD:     73.6   %    71 - 87  FL:       49.6  mm     G. Age:  26w 5d         54  %    FL/AC:      22.1   %    20 - 24  LV:        4.2  mm  Est. FW:     990  gm      2 lb 3 oz     69  % ---------------------------------------------------------------------- OB History  Gravidity:    7         Term:   3         SAB:   1  TOP:          2        Living:  3 ---------------------------------------------------------------------- Gestational Age  LMP:           26w 1d        Date:  12/01/21                  EDD:   09/07/22  U/S Today:     27w 0d                                        EDD:   09/01/22  Best:          26w 1d  Det. By:  LMP  (12/01/21)          EDD:   09/07/22  ---------------------------------------------------------------------- Anatomy  Cranium:               Appears normal         LVOT:                   Previously seen  Cavum:                 Appears normal         Aortic Arch:            Previously seen  Ventricles:            Appears normal         Ductal Arch:            Previously seen  Choroid Plexus:        Previously seen        Diaphragm:              Appears normal  Cerebellum:            Previously seen        Stomach:                Appears normal, left                                                                        sided  Posterior Fossa:       Previously seen        Abdomen:                Appears normal  Nuchal Fold:           Not applicable ([id number]    Abdominal Wall:         Previously seen                         wks GA)  Face:                  Orbits and profile     Cord Vessels:           Previously seen                         previously seen  Lips:                  Previously seen        Kidneys:                Appear normal  Palate:                Not well visualized    Bladder:                Appears normal  Thoracic:              Previously seen        Spine:                  Previously seen  Heart:  Previously seen        Upper Extremities:      Previously seen  RVOT:                  Previously seen        Lower Extremities:      Previously seen  Other:  Female gender previously seen. Heels/feet and open hands/5th          digits, 3VV/3VTV, Nasal bone, lenses, maxilla, mandible, falx          previously visualized. ---------------------------------------------------------------------- Comments  [name] is at 26w 1d with EDD of 09/07/2022 dated by  LMP  (12/01/21) here for a detailed anatomic survey for  obesity (BMI 33) and molar pregnancy. She had low risk  NIPS and normal AFP. She has no concerns today.  Sonographic findings  Single intrauterine pregnancy.  Fetal cardiac activity:  Observed and appears normal.   Presentation: Breech.  Interval fetal anatomy appears normal.  Fetal biometry shows the estimated fetal weight at the 69  percentile.  Amniotic fluid volume: Within normal limits. MVP: 6.9 cm.  Placenta: Anterior.  Recommendations  - F/u as clinically indicated ----------------------------------------------------------------------                  Valeda Malm, DO Electronically Signed Final Report   06/02/2022 04:28 pm ----------------------------------------------------------------------   Assessment and Plan:  Pregnancy: [id number] at 65w3d1. Supervision of other normal pregnancy, antepartum - doing well, vigorous and frequent fetal movement   2. [redacted] weeks gestation of pregnancy - continue routine follow-up   3. Diet controlled gestational diabetes mellitus (GDM) in third trimester - growth UKoreaordered  - Appointment scheduled for GDM class  - Has been tracking blood sugars, but does not have the log accessible at this time and can't recall sugar values - Plan for patient to upload sugars to MyChart later today  4. Back pain during pregnancy - Declines magnesium supplement at this time - Patient prefers seeking care with chiropractor  - Encouraged patient to consider incorporating mild exercise or short walks, since patient's job requires her to be seated throughout the day. Reinforced benefits of pregnancy belt.    Preterm labor symptoms and general obstetric precautions including but not limited to vaginal bleeding, contractions, leaking of fluid and fetal movement were reviewed in detail with the patient. I discussed the assessment and treatment plan with the patient. The patient was provided an opportunity to ask questions and all were answered. The patient agreed with the plan and demonstrated an understanding of the instructions. The patient was advised to call back or seek an in-person office evaluation/[facility]  [facility]. Please refer to After Visit Summary for other counseling recommendations.   I provided 15 minutes of face-to-face time during this encounter.  Return in about 2 weeks (around 07/16/2022) for LOB, IN-PERSON.  Future Appointments  Date Time Provider [place] 07/08/2022  2:45 PM NWheelerGDM CLASS NMicanopyNDM  07/13/2022  3:30 PM [facility] NURSE [facility] 07/13/2022  3:45 PM [facility]MFC US5 [facility] 07/16/2022  9:55 AM DLaury Deep CNM CWeedNone    NJohnston Ebbs NP Center for [facility]ords

## 2022-07-08 ENCOUNTER — Encounter: Payer: Medicaid Other | Attending: Obstetrics and Gynecology | Admitting: Registered"

## 2022-07-08 DIAGNOSIS — O24419 Gestational diabetes mellitus in pregnancy, unspecified control: Secondary | ICD-10-CM | POA: Insufficient documentation

## 2022-07-09 ENCOUNTER — Encounter: Payer: Self-pay | Admitting: Registered"

## 2022-07-09 DIAGNOSIS — O24419 Gestational diabetes mellitus in pregnancy, unspecified control: Secondary | ICD-10-CM

## 2022-07-09 HISTORY — DX: Gestational diabetes mellitus in pregnancy, unspecified control: O24.419

## 2022-07-09 NOTE — Progress Notes (Signed)
The following learning objectives were met by the patient during this course:   States the definition of Gestational Diabetes States why dietary management is important in controlling blood glucose Describes the effects each nutrient has on blood glucose levels Demonstrates ability to create a balanced meal plan Demonstrates carbohydrate counting  States when to check blood glucose levels Demonstrates proper blood glucose monitoring techniques States the effect of stress and exercise on blood glucose levels States the importance of limiting caffeine and abstaining from alcohol and smoking   Blood glucose monitor given: None. Patient has meter and checking blood sugar prior to class.   Patient instructed to monitor glucose levels: FBS: 60 - <95; 1 hour: <140; 2 hour: <120   Patient received handouts: Nutrition Diabetes and Pregnancy, including carb counting list Glucose log sheet   Patient will be seen for follow-up as needed. 

## 2022-07-09 NOTE — Patient Instructions (Addendum)
Tips for Keeping "regular"   To prevent constipation and avoid using laxatives:   Be sure to include at least 8 cups of fluid per day (64 oz total) from beverages (including water, tea, coffee, juice, milk). Dehydration is the number one cause of constipation. But don't overdo it. Too much water can also cause electrolyte abnormalities.  Increase fiber in your diet, gradually. Fiber softens stools and helps prevent both diarrhea and constipation. Bran cereals are an excellent source of fiber. Check the labels on the side of the box for the fiber content. Fruits and vegetables, nuts and seeds are other good sources. Excessive fiber can cause bloating and gas, so don't overdo it. Also, make sure to consume adequate fluids when increasing the fiber in your food.  Get regular, moderate exercise. Exercise is important in promoting normal intestinal contractions. Moving your body helps your intestines get moving, too.  Eat regularly. Eating is a powerful stimulant of intestinal contractions. Give yourself plenty of opportunities for normal intestinal contractions by eating 3 meals a day plus 2-3 snacks.  Pay attention to the gastro-colic reflex. Within about half an hour after eating, most people will have some intestinal movement which sometimes leads to the urge to defecate. For some people, scheduling a few minutes to sit on the toilet shortly after breakfast or dinner may help. Suppression of the normal urge to have a bowel movement will lead to harder, larger stools over time, and decreased intestinal tone. Reading a book may help the body relax and allow the gastro-colic reflex to occur. Avoid straining.  Keep a record of your stools. If you go more than 3-4 days without a stool, call your health care provider for safe medication to help you get through this period without resorting to stimulant laxatives.  Resources to help with food assistance:  While a patient at [facility] for [place] you may visit the [employer] at the [employer] [facility], [place] location.  Food Finder - A Simple Gesture [place] (asimplegesturegso.org)  Story and [employer] Books were created in 2015. They may have been updated in 2018, but if you use these resources you may want to call ahead to make sure the information is still correct. Pantries: little blue book.. do a search and look for wordpress link. [place] computers block access because it is a blog site. Meals: the-little-green-book-08-10-2015.pdf (asimplegesturegso.org)  Mobile: [employer] website updates their calendar monthly with times and locations  Some Farmers Markets accept EBT and sometimes you can find some that will double your benefits.  Curb market is one that accepts EBT: [employer], [place] ([place].org)

## 2022-07-10 DIAGNOSIS — Z419 Encounter for procedure for purposes other than remedying health state, unspecified: Secondary | ICD-10-CM | POA: Diagnosis not present

## 2022-07-13 ENCOUNTER — Ambulatory Visit: Payer: Medicaid Other

## 2022-07-14 ENCOUNTER — Encounter: Payer: Self-pay | Admitting: *Deleted

## 2022-07-14 ENCOUNTER — Ambulatory Visit: Payer: Medicaid Other | Attending: Obstetrics and Gynecology | Admitting: *Deleted

## 2022-07-14 ENCOUNTER — Ambulatory Visit (HOSPITAL_BASED_OUTPATIENT_CLINIC_OR_DEPARTMENT_OTHER): Payer: Medicaid Other

## 2022-07-14 VITALS — BP 126/68 | HR 71

## 2022-07-14 DIAGNOSIS — O09A3 Supervision of pregnancy with history of molar pregnancy, third trimester: Secondary | ICD-10-CM | POA: Diagnosis not present

## 2022-07-14 DIAGNOSIS — D563 Thalassemia minor: Secondary | ICD-10-CM | POA: Diagnosis not present

## 2022-07-14 DIAGNOSIS — O99213 Obesity complicating pregnancy, third trimester: Secondary | ICD-10-CM | POA: Insufficient documentation

## 2022-07-14 DIAGNOSIS — E669 Obesity, unspecified: Secondary | ICD-10-CM

## 2022-07-14 DIAGNOSIS — O09293 Supervision of pregnancy with other poor reproductive or obstetric history, third trimester: Secondary | ICD-10-CM | POA: Insufficient documentation

## 2022-07-14 DIAGNOSIS — Z3A32 32 weeks gestation of pregnancy: Secondary | ICD-10-CM | POA: Insufficient documentation

## 2022-07-14 DIAGNOSIS — O24419 Gestational diabetes mellitus in pregnancy, unspecified control: Secondary | ICD-10-CM | POA: Diagnosis not present

## 2022-07-14 DIAGNOSIS — O24410 Gestational diabetes mellitus in pregnancy, diet controlled: Secondary | ICD-10-CM | POA: Diagnosis not present

## 2022-07-14 DIAGNOSIS — Z3480 Encounter for supervision of other normal pregnancy, unspecified trimester: Secondary | ICD-10-CM

## 2022-07-15 ENCOUNTER — Other Ambulatory Visit: Payer: Self-pay | Admitting: *Deleted

## 2022-07-15 DIAGNOSIS — O24419 Gestational diabetes mellitus in pregnancy, unspecified control: Secondary | ICD-10-CM

## 2022-07-16 ENCOUNTER — Encounter: Payer: Self-pay | Admitting: Obstetrics and Gynecology

## 2022-07-16 ENCOUNTER — Ambulatory Visit (INDEPENDENT_AMBULATORY_CARE_PROVIDER_SITE_OTHER): Payer: Medicaid Other | Admitting: Obstetrics and Gynecology

## 2022-07-16 VITALS — BP 116/77 | HR 84 | Wt 215.2 lb

## 2022-07-16 DIAGNOSIS — Z5941 Food insecurity: Secondary | ICD-10-CM

## 2022-07-16 DIAGNOSIS — O0993 Supervision of high risk pregnancy, unspecified, third trimester: Secondary | ICD-10-CM

## 2022-07-16 DIAGNOSIS — O24410 Gestational diabetes mellitus in pregnancy, diet controlled: Secondary | ICD-10-CM

## 2022-07-16 DIAGNOSIS — Z3A32 32 weeks gestation of pregnancy: Secondary | ICD-10-CM

## 2022-07-16 NOTE — Progress Notes (Signed)
Pt presents for ROB visit. Pt has completed diabetes education class. Pt has concerns about weekly Korea and inductions process.

## 2022-07-16 NOTE — Progress Notes (Addendum)
[place] PREGNANCY OFFICE VISIT Patient name: Anita Allen MRN [id number]  Date of birth: 1990-07-10 Chief Complaint:   Routine Prenatal Visit  History of Present Illness:   Anita Allen is a 32 y.o. [id number] female at 62w3dwith an Estimated Date of Delivery: 09/07/22 being seen today for ongoing management of a high-risk pregnancy complicated by [id number] she reports  she has concerns about if she will need weekly U/S soon and when she will be induced . Contractions: Irritability. Vag. Bleeding: None.  Movement: Present. denies leaking of fluid.  Review of Systems:   Pertinent items are noted in HPI Denies abnormal vaginal discharge w/ itching/odor/irritation, headaches, visual changes, shortness of breath, chest pain, abdominal pain, severe nausea/vomiting, or problems with urination or bowel movements unless otherwise stated above. Pertinent History Reviewed:  Reviewed past medical,surgical, social, obstetrical and family history.  Reviewed problem list, medications and allergies. Physical Assessment:   Vitals:   07/16/22 1017  BP: 116/77  Pulse: 84  Weight: 215 lb 3.2 oz (97.6 kg)  Body mass index is 39.36 kg/m.           Physical Examination:   General appearance: alert, well appearing, and in no distress, oriented to person, place, and time, and overweight  Mental status: alert, oriented to person, place, and time, normal mood, behavior, speech, dress, motor activity, and thought processes  Skin: warm & dry   Extremities: Edema: Moderate pitting, indentation subsides rapidly    Cardiovascular: normal heart rate noted  Respiratory: normal respiratory effort, no distress  Abdomen: gravid, soft, non-tender  Pelvic: Cervical exam deferred         Fetal Status: Fetal Heart Rate (bpm): 159 Fundal Height: 34 cm Movement: Present    Fetal Surveillance Testing today: none   No results found for this or any previous visit (from the past 24 hour(s)).  Assessment & Plan:  1)  High-risk pregnancy [id number] 337w3dith an Estimated Date of Delivery: 09/07/22   2) Pregnancy, supervision, high-risk, third trimester - Explained IOL is recommended for well-controlled GDM at 39-40 weeks - Advised that medical IOL (ie. IOL for GDTwelve-Step Living Corporation - [facility] Centerreference over elective IOL  3) Diet controlled gestational diabetes mellitus (GDM), antepartum - Review of Blood Sugar Levels: FBS range = 90-97 mg/dL  2 hr PP range = 140s - Discussed how PP BS are out of range d/t poor food choices with meals. - Reviewed options to vary her meals to be more compliant with ADA diet; lower carbs and higher protein - Reassurance given that EFW on 07/14/2022 was 75% and AFI was normal - F/U U/S scheduled for 36 weeks  4) Food insecurity - Patient reports "not getting enough money in food stamps and running out of money for food before the month is over." She also reports she has to [employer] Da[employer] extra money and pay out of pocket for groceries to make it through the rest of the month. - MyChart message sent to patient about BrMargaretha Seedsnformation  5) [redacted] weeks gestation of pregnancy    Meds: No orders of the defined types were placed in this encounter.   Labs/procedures today: none  Treatment Plan:  continue with MFM plan  Reviewed: Preterm labor symptoms and general obstetric precautions including but not limited to vaginal bleeding, contractions, leaking of fluid and fetal movement were reviewed in detail with the patient.  All questions were answered. Has home bp cuff. Check bp weekly, let usKoreanow if >140/90.  Follow-up: Return in about 2 weeks (around 07/30/2022) for Return OB visit.  No orders of the defined types were placed in this encounter.  Laury Deep MSN, CNM 07/16/2022 1:00 PM

## 2022-07-23 ENCOUNTER — Inpatient Hospital Stay (HOSPITAL_COMMUNITY)
Admission: AD | Admit: 2022-07-23 | Discharge: 2022-07-23 | Disposition: A | Discharge status: Home / Self Care | Payer: Medicaid Other | Attending: Obstetrics and Gynecology | Admitting: Obstetrics and Gynecology

## 2022-07-23 ENCOUNTER — Encounter (HOSPITAL_COMMUNITY): Payer: Self-pay | Admitting: Obstetrics and Gynecology

## 2022-07-23 DIAGNOSIS — R600 Localized edema: Secondary | ICD-10-CM

## 2022-07-23 DIAGNOSIS — Z3A33 33 weeks gestation of pregnancy: Secondary | ICD-10-CM | POA: Insufficient documentation

## 2022-07-23 DIAGNOSIS — Z79899 Other long term (current) drug therapy: Secondary | ICD-10-CM | POA: Diagnosis not present

## 2022-07-23 DIAGNOSIS — O1203 Gestational edema, third trimester: Secondary | ICD-10-CM | POA: Insufficient documentation

## 2022-07-23 DIAGNOSIS — Z3689 Encounter for other specified antenatal screening: Secondary | ICD-10-CM

## 2022-07-23 DIAGNOSIS — Z7982 Long term (current) use of aspirin: Secondary | ICD-10-CM | POA: Insufficient documentation

## 2022-07-23 DIAGNOSIS — O26893 Other specified pregnancy related conditions, third trimester: Secondary | ICD-10-CM | POA: Insufficient documentation

## 2022-07-23 DIAGNOSIS — R519 Headache, unspecified: Secondary | ICD-10-CM | POA: Insufficient documentation

## 2022-07-23 DIAGNOSIS — Z3480 Encounter for supervision of other normal pregnancy, unspecified trimester: Secondary | ICD-10-CM

## 2022-07-23 LAB — URINALYSIS, ROUTINE W REFLEX MICROSCOPIC
Bilirubin Urine: NEGATIVE
Glucose, UA: NEGATIVE mg/dL
Hgb urine dipstick: NEGATIVE
Ketones, ur: NEGATIVE mg/dL
Nitrite: NEGATIVE
Protein, ur: 30 mg/dL — AB
Specific Gravity, Urine: 1.02 (ref 1.005–1.030)
pH: 5 (ref 5.0–8.0)

## 2022-07-23 MED ORDER — ACETAMINOPHEN 500 MG PO TABS
1000.0000 mg | ORAL_TABLET | Freq: Once | ORAL | Status: AC
  Administered 2022-07-23: 1000 mg via ORAL
  Filled 2022-07-23: qty 2

## 2022-07-23 NOTE — MAU Note (Addendum)
.  Anita Allen is a 32 y.o. at [redacted]w[redacted]d here in MAU reporting increased in swelling in feet and ankles since 12n. Headache this afternoon but has not taken anything. Denies visual changes or epigastric pain. Reports good FM and denies VB or LOF Onset of complaint: 12n Pain score: 4 Vitals:   07/23/22 1952 07/23/22 1953  BP:  124/70  Pulse:  76  Resp: 17   Temp: 98.3 F (36.8 C)   SpO2: 100%      FHT:148 Lab orders placed from triage:  u/a

## 2022-07-23 NOTE — MAU Provider Note (Signed)
History     CSN: [id number]  Arrival date and time: 07/23/22 1932   Event Date/Time   First Provider Initiated Contact with Patient 07/23/22 2104      Chief Complaint  Patient presents with   Joint Swelling   Headache   HPI Anita Allen is a 32 y.o. [id number] at [redacted]w[redacted]d She presents to MAU with chief complaint of bilateral lower extremity edema. This is a recurrent problem, worsening over time. She works from home and takes breaks to get up and move around. She also reports elevating her legs while working. She has tried to wear compression socks but the pair she owns don't fit above her calf.  She denies unilateral swelling. She denies calf discoloration or pain with walking. She is not experiencing SOB, activity intolerance, weakness or syncope.  Patient also c/o mild headache. Pain is present but not a major concern for her. Pain score is 4/10. She has not felt she needed medication for this complaint.  She denies vaginal bleeding, leaking of fluid, decreased fetal movement, fever, falls, or recent illness.   Patient receives care with [facility]. Pregnancy is c/b A1GDM  OB History     Gravida  7   Para  3   Term  3   Preterm  0   AB  3   Living  3      SAB  1   IAB  2   Ectopic  0   Multiple  0   Live Births  3           Past Medical History:  Diagnosis Date   BV (bacterial vaginosis)    Gonorrhea contact, treated    H/O dilation and curettage    Headache    Hx of trichomoniasis    Molar pregnancy 12/2013   Ovarian cyst    PCOS (polycystic ovarian syndrome)    UTI (urinary tract infection)    Vaginal delivery 2012, 2013   Yeast infection     Past Surgical History:  Procedure Laterality Date   brazillian butt lift     DILATION AND CURETTAGE OF UTERUS     DILATION AND EVACUATION N/A 12/20/2013   Procedure: DILATATION AND EVACUATION;  Surgeon: JWoodroe Mode MD;  [facility];  Service: Gynecology;  Laterality: N/A;   LIPOSUCTION TRUNK   2021    Family History  Problem Relation Age of Onset   Hypertension Mother    Heart attack Mother    Thyroid disease Mother    Heart disease Father    Hypertension Father    Heart attack Father     Social History   Tobacco Use   Smoking status: Never   Smokeless tobacco: Never  Vaping Use   Vaping Use: Never used  Substance Use Topics   Alcohol use: Not Currently    Comment: not often, not since confirmed pregnancy   Drug use: No    Allergies:  Allergies  Allergen Reactions   Augmentin [Amoxicillin-Pot Clavulanate] Hives and Other (See Comments)    Pt has taken Ceftriaxone in the past (??)   Latex Rash    Vaginal irritation with condoms    Medications Prior to Admission  Medication Sig Dispense Refill Last Dose   aspirin 81 MG chewable tablet Chew 1 tablet (81 mg total) by mouth daily. 30 tablet 6 07/23/2022 at 0800   docusate sodium (COLACE) 100 MG capsule Take 1 capsule (100 mg total) by mouth 2 (two) times daily. 60 capsule  5 07/23/2022 at 0800   famotidine (PEPCID) 40 MG tablet Take 0.5 tablets (20 mg total) by mouth at bedtime. 30 tablet 2 07/23/2022 at 0800   pantoprazole (PROTONIX) 20 MG tablet Take 1 tablet (20 mg total) by mouth daily. 60 tablet 5 07/23/2022 at 0800   Prenat-Fe Poly-Methfol-FA-DHA (VITAFOL ULTRA) 29-0.6-0.4-200 MG CAPS Take 1 capsule by mouth daily. 30 capsule 11 07/23/2022 at 0800   Accu-Chek Softclix Lancets lancets Check blood glucose 4 times daily via percutaneous route 100 each 12    Blood Glucose Monitoring Suppl (ACCU-CHEK GUIDE) w/Device KIT 1 Device by Percutaneous route 4 (four) times daily. 1 kit 0    Blood Pressure Monitoring (BLOOD PRESSURE KIT) DEVI 1 kit by Does not apply route once a week. 1 each 0    glucose blood (ACCU-CHEK GUIDE) test strip Check blood glucose 4 times daily via percutaneous route 50 each 12    polyethylene glycol powder (GLYCOLAX/MIRALAX) 17 GM/SCOOP powder Take 17 g by mouth daily as needed. 510 g 11     Prenat-FeFmCb-DSS-FA-DHA w/o A (CITRANATAL HARMONY) 27-1-260 MG CAPS Take 1 capsule by mouth daily before breakfast. 30 capsule 90     Review of Systems  Cardiovascular:  Positive for leg swelling.  Neurological:  Positive for headaches.  All other systems reviewed and are negative.  Physical Exam   Blood pressure 109/64, pulse 82, temperature 98.3 F (36.8 C), resp. rate 17, height '5\' 2"'$  (1.575 m), weight 99.3 kg, last menstrual period 12/01/2021, SpO2 98 %.   Physical Exam Vitals and nursing note reviewed.  Constitutional:      General: She is not in acute distress.    Appearance: She is well-developed. She is not ill-appearing.  Cardiovascular:     Rate and Rhythm: Normal rate.  Pulmonary:     Effort: Pulmonary effort is normal.  Abdominal:     Palpations: Abdomen is soft.  Musculoskeletal:     Right lower leg: 2+ Edema present.     Left lower leg: 2+ Edema present.  Skin:    Capillary Refill: Capillary refill takes less than 2 seconds.  Neurological:     Mental Status: She is alert and oriented to person, place, and time.  Psychiatric:        Mood and Affect: Mood normal.        Behavior: Behavior normal.    Bilateral lower extremity edema, symmetrical bilaterally.   Patient participated in measuring legs just below knee and mid-calf, measurements identical  MAU Course  Procedures  MDM --Reactive tracing: baseline 140, mod var, + accels, no decels --Toco: quiet --Reviewed typical hallmarks of DVT Doppler, low suspicion for DVT but offered patient imaging based on being third trimester and ethnic minority. Patient declined  Patient Vitals for the past 24 hrs:  BP Temp Pulse Resp SpO2 Height Weight  07/23/22 2122 125/78 -- 73 -- -- -- --  07/23/22 2022 109/64 -- 82 -- 98 % -- --  07/23/22 1953 124/70 -- 76 -- -- -- --  07/23/22 1952 -- 98.3 F (36.8 C) -- 17 100 % '5\' 2"'$  (1.575 m) 99.3 kg   Meds ordered this encounter  Medications   acetaminophen (TYLENOL)  tablet 1,000 mg   Assessment and Plan  --32 y.o. [id number] at [redacted]w[redacted]d --Reactive tracing --Headache without HTN --Bilateral lower extremity edema --Doppler study declined by patient --Additional HA treatment declined by patient --Discharge home in stable condition  [place] MSN, CNM 07/23/2022, 9:40 PM

## 2022-07-25 LAB — CULTURE, OB URINE: Culture: 20000 — AB

## 2022-07-30 ENCOUNTER — Other Ambulatory Visit (HOSPITAL_COMMUNITY)
Admission: RE | Admit: 2022-07-30 | Discharge: 2022-07-30 | Disposition: A | Discharge status: Home / Self Care | Payer: Medicaid Other | Source: Ambulatory Visit | Attending: Obstetrics and Gynecology | Admitting: Obstetrics and Gynecology

## 2022-07-30 ENCOUNTER — Ambulatory Visit (INDEPENDENT_AMBULATORY_CARE_PROVIDER_SITE_OTHER): Payer: Medicaid Other | Admitting: Obstetrics and Gynecology

## 2022-07-30 VITALS — BP 117/77 | HR 80 | Wt 222.0 lb

## 2022-07-30 DIAGNOSIS — O26893 Other specified pregnancy related conditions, third trimester: Secondary | ICD-10-CM | POA: Diagnosis not present

## 2022-07-30 DIAGNOSIS — O24410 Gestational diabetes mellitus in pregnancy, diet controlled: Secondary | ICD-10-CM

## 2022-07-30 DIAGNOSIS — Z3480 Encounter for supervision of other normal pregnancy, unspecified trimester: Secondary | ICD-10-CM

## 2022-07-30 DIAGNOSIS — R600 Localized edema: Secondary | ICD-10-CM

## 2022-07-30 DIAGNOSIS — Z23 Encounter for immunization: Secondary | ICD-10-CM | POA: Diagnosis not present

## 2022-07-30 DIAGNOSIS — R102 Pelvic and perineal pain: Secondary | ICD-10-CM

## 2022-07-30 DIAGNOSIS — Z3A34 34 weeks gestation of pregnancy: Secondary | ICD-10-CM

## 2022-07-30 NOTE — Progress Notes (Signed)
PRENATAL VISIT NOTE  Subjective:  Anita Allen is a 32 y.o. [phone] at [redacted]w[redacted]d being seen today for ongoing prenatal care.  She is currently monitored for the following issues for this high-risk pregnancy and has Abnormal uterine bleeding (AUB); Difficulty maintaining weight; Constipation; Rash and nonspecific skin eruption; PCOS (polycystic ovarian syndrome); LGSIL on Pap smear of cervix; Supervision of other normal pregnancy, antepartum; Obesity in pregnancy, antepartum; and Gestational diabetes mellitus (GDM), antepartum on their problem list.  Patient reports the following: Leg swelling & headaches - swelling is currently L > R. Causes pain to the point that she can't do her usual activities. Has tried compression stockings and elevates her feet as much as possible. No calf pain or erythema. No unexpected SOB, no CP. Has been normotensive here & at home. Severe back & pelvic pain - worse at night, limits her ability to sleep comfortably. Sleeps with several pillows to support her body without relief. Has not tried a maternity support belt and would like to avoid tylenol.     Contractions: Irritability. Vag. Bleeding: None.  Movement: Present. Denies leaking of fluid.   The following portions of the patient's history were reviewed and updated as appropriate: allergies, current medications, past family history, past medical history, past social history, past surgical history and problem list.   Objective:   Vitals:   07/30/22 1404  BP: 117/77  Pulse: 80  Weight: 222 lb (100.7 kg)    Fetal Status: Fetal Heart Rate (bpm): 152 Fundal Height: 35 cm Movement: Present     General:  Alert, oriented and cooperative. Patient is in no acute distress.  Skin: Skin is warm and dry. No rash noted.   Cardiovascular: Normal heart rate noted  Respiratory: Normal respiratory effort, no problems with respiration noted  Abdomen: Soft, gravid, appropriate for gestational age.  Pain/Pressure: Present      Extremities: 1+ pitting edema to knees. Significantly more swelling on L > R.  Assessment and Plan:  Pregnancy: [id number] at [redacted]w[redacted]d 1. Supervision of other normal pregnancy, antepartum 2. [redacted] weeks gestation of pregnancy GBS next visit  3. Lower extremity edema Reviewed "can't miss" diagnoses for edema are peripartum cardiomyopathy/heart failure, DVT, and preeclampsia. Recommended LE dopplers to r/o DVT. Discussed that her normal vital signs and lack of SOB point away from cardiomyopathy/CHF. Reviewed that preeclampsia is only diagnosed when patients have elevated blood pressures. Explained that preeclampsia may be developing or could develop down the line, but that until she meets criteria there isn't additional testing indicated. We also reviewed that there isn't anything at this point to do to prevent preeclampsia, but that we should be vigilant with monitoring Bps and symptoms. She will continue home blood pressure monitoring and will notify us for numbers > 140/90. Discussed that we can see her for BP check & labs prior to her 36 week appointment if her Bps or symptoms change.  - VAS [place] LOWER EXTREMITY VENOUS (DVT); Future  4. Pelvic pain affecting pregnancy in third trimester, antepartum Overall seems to be consistent with pregnancy-related pain. Cervix closed. Has tried PT in the past. Discussed using maternity belt, tylenol prn, and OTC medications like Tiger Balm for relief. Reviewed return precautions - Cervicovaginal ancillary only  5. Diet controlled gestational diabetes mellitus (GDM), antepartum BG reviewed. Has some difficulty checking with work & caring for her three children; has all fasting values, most post-breakfast, and some post-lunch. Fasting are 76-109 (total 3/7 elevated) and 1 hour post prandial are 95-140 (total  of 1/8 elevated). Adequately diet controlled based on log day.  Growth [place] scheduled for tomorrow. If LGA, would consider starting medications and doing weekly BPPs.    Please refer to After Visit Summary for other counseling recommendations.   Return in about 2 weeks (around 08/13/2022).  Future Appointments  Date Time Provider [place]  08/13/2022  1:30 PM Chancy Milroy, MD [facility] None  08/13/2022  3:15 PM [facility] NURSE [facility]  08/13/2022  3:30 PM [facility] US3 [facility]   Inez Catalina, MD

## 2022-07-30 NOTE — Progress Notes (Addendum)
ROB, c/o pain and pressure.   Pt  BS readings Fasting 76-145 / 1 hr. 125-150

## 2022-07-31 LAB — CERVICOVAGINAL ANCILLARY ONLY
Chlamydia: NEGATIVE
Comment: NEGATIVE
Comment: NEGATIVE
Comment: NORMAL
Neisseria Gonorrhea: NEGATIVE
Trichomonas: NEGATIVE

## 2022-08-06 ENCOUNTER — Ambulatory Visit (HOSPITAL_COMMUNITY)
Admission: RE | Admit: 2022-08-06 | Discharge: 2022-08-06 | Disposition: A | Discharge status: Home / Self Care | Payer: Medicaid Other | Source: Ambulatory Visit | Attending: Obstetrics and Gynecology | Admitting: Obstetrics and Gynecology

## 2022-08-06 DIAGNOSIS — R600 Localized edema: Secondary | ICD-10-CM | POA: Diagnosis not present

## 2022-08-10 DIAGNOSIS — Z419 Encounter for procedure for purposes other than remedying health state, unspecified: Secondary | ICD-10-CM | POA: Diagnosis not present

## 2022-08-13 ENCOUNTER — Ambulatory Visit: Payer: Medicaid Other | Admitting: *Deleted

## 2022-08-13 ENCOUNTER — Ambulatory Visit: Payer: Medicaid Other | Attending: Obstetrics and Gynecology

## 2022-08-13 ENCOUNTER — Ambulatory Visit (INDEPENDENT_AMBULATORY_CARE_PROVIDER_SITE_OTHER): Payer: Medicaid Other | Admitting: Obstetrics and Gynecology

## 2022-08-13 ENCOUNTER — Encounter: Payer: Self-pay | Admitting: Obstetrics and Gynecology

## 2022-08-13 VITALS — BP 131/85 | HR 77 | Wt 224.0 lb

## 2022-08-13 VITALS — BP 130/70 | HR 86

## 2022-08-13 DIAGNOSIS — O99213 Obesity complicating pregnancy, third trimester: Secondary | ICD-10-CM

## 2022-08-13 DIAGNOSIS — D563 Thalassemia minor: Secondary | ICD-10-CM

## 2022-08-13 DIAGNOSIS — O24419 Gestational diabetes mellitus in pregnancy, unspecified control: Secondary | ICD-10-CM | POA: Insufficient documentation

## 2022-08-13 DIAGNOSIS — O09A3 Supervision of pregnancy with history of molar pregnancy, third trimester: Secondary | ICD-10-CM

## 2022-08-13 DIAGNOSIS — R87612 Low grade squamous intraepithelial lesion on cytologic smear of cervix (LGSIL): Secondary | ICD-10-CM

## 2022-08-13 DIAGNOSIS — Z3480 Encounter for supervision of other normal pregnancy, unspecified trimester: Secondary | ICD-10-CM | POA: Diagnosis not present

## 2022-08-13 DIAGNOSIS — Z3A36 36 weeks gestation of pregnancy: Secondary | ICD-10-CM

## 2022-08-13 DIAGNOSIS — O24410 Gestational diabetes mellitus in pregnancy, diet controlled: Secondary | ICD-10-CM

## 2022-08-13 DIAGNOSIS — E669 Obesity, unspecified: Secondary | ICD-10-CM

## 2022-08-13 DIAGNOSIS — Z3009 Encounter for other general counseling and advice on contraception: Secondary | ICD-10-CM | POA: Insufficient documentation

## 2022-08-13 NOTE — Progress Notes (Addendum)
ROB/GBS.  Pt is not checking her BS.

## 2022-08-13 NOTE — Progress Notes (Signed)
Subjective:  Anita Allen is a 32 y.o. [phone] at [redacted]w[redacted]d being seen today for ongoing prenatal care.  She is currently monitored for the following issues for this high-risk pregnancy and has Difficulty maintaining weight; Rash and nonspecific skin eruption; PCOS (polycystic ovarian syndrome); LGSIL on Pap smear of cervix; Supervision of other normal pregnancy, antepartum; Obesity in pregnancy, antepartum; Gestational diabetes mellitus (GDM), antepartum; and Unwanted fertility on their problem list.  Patient reports  general discomforts of pregnancy .  Contractions: Irritability. Vag. Bleeding: None.  Movement: Present. Denies leaking of fluid.   The following portions of the patient's history were reviewed and updated as appropriate: allergies, current medications, past family history, past medical history, past social history, past surgical history and problem list. Problem list updated.  Objective:   Vitals:   08/13/22 1405  BP: 131/85  Pulse: 77  Weight: 224 lb (101.6 kg)    Fetal Status: Fetal Heart Rate (bpm): 141   Movement: Present     General:  Alert, oriented and cooperative. Patient is in no acute distress.  Skin: Skin is warm and dry. No rash noted.   Cardiovascular: Normal heart rate noted  Respiratory: Normal respiratory effort, no problems with respiration noted  Abdomen: Soft, gravid, appropriate for gestational age. Pain/Pressure: Present     Pelvic:  Cervical exam performed        Extremities: Normal range of motion.  Edema: Mild pitting, slight indentation  Mental Status: Normal mood and affect. Normal behavior. Normal judgment and thought content.   Urinalysis:      Assessment and Plan:  Pregnancy: [id number] at [redacted]w[redacted]d  1. Supervision of other normal pregnancy, antepartum Stable Labor precautions GBS today  2. Diet controlled gestational diabetes mellitus (GDM), antepartum Not checking CBG's Growth scan today IOL per today's U/S results or @ 39 weeks  3.  LGSIL on Pap smear of cervix Pap smear PP  4. Unwanted fertility BTL papers signed. Pt still considering  Term labor symptoms and general obstetric precautions including but not limited to vaginal bleeding, contractions, leaking of fluid and fetal movement were reviewed in detail with the patient. Please refer to After Visit Summary for other counseling recommendations.  Return in about 1 week (around 08/20/2022) for OB visit, face to face, MD only.   Chancy Milroy, MD

## 2022-08-17 LAB — CULTURE, BETA STREP (GROUP B ONLY): Strep Gp B Culture: NEGATIVE

## 2022-08-18 ENCOUNTER — Ambulatory Visit (INDEPENDENT_AMBULATORY_CARE_PROVIDER_SITE_OTHER): Payer: Medicaid Other | Admitting: Obstetrics and Gynecology

## 2022-08-18 VITALS — BP 124/82 | HR 80 | Wt 226.0 lb

## 2022-08-18 DIAGNOSIS — O24410 Gestational diabetes mellitus in pregnancy, diet controlled: Secondary | ICD-10-CM

## 2022-08-18 DIAGNOSIS — Z3A37 37 weeks gestation of pregnancy: Secondary | ICD-10-CM

## 2022-08-18 DIAGNOSIS — Z3009 Encounter for other general counseling and advice on contraception: Secondary | ICD-10-CM

## 2022-08-18 DIAGNOSIS — Z3480 Encounter for supervision of other normal pregnancy, unspecified trimester: Secondary | ICD-10-CM

## 2022-08-18 NOTE — Progress Notes (Signed)
   PRENATAL VISIT NOTE  Subjective:  Anita Allen is a 32 y.o. [phone] at [redacted]w[redacted]d being seen today for ongoing prenatal care.  She is currently monitored for the following issues for this high-risk pregnancy and has Difficulty maintaining weight; Rash and nonspecific skin eruption; PCOS (polycystic ovarian syndrome); LGSIL on Pap smear of cervix; Supervision of other normal pregnancy, antepartum; Obesity in pregnancy, antepartum; Gestational diabetes mellitus (GDM), antepartum; and Unwanted fertility on their problem list.  Patient reports doing okay overall. Has not been checking blood sugars.  Contractions: Irritability. Vag. Bleeding: None.  Movement: Present. Denies leaking of fluid.   The following portions of the patient's history were reviewed and updated as appropriate: allergies, current medications, past family history, past medical history, past social history, past surgical history and problem list.   Objective:   Vitals:   08/18/22 1345  BP: 124/82  Pulse: 80  Weight: 226 lb (102.5 kg)    Fetal Status: Fetal Heart Rate (bpm): 152   Movement: Present     General:  Alert, oriented and cooperative. Patient is in no acute distress.  Skin: Skin is warm and dry. No rash noted.   Cardiovascular: Normal heart rate noted  Respiratory: Normal respiratory effort, no problems with respiration noted  Abdomen: Soft, gravid, appropriate for gestational age.  Pain/Pressure: Present      Assessment and Plan:  Pregnancy: [id number] at [redacted]w[redacted]d 1. Supervision of other normal pregnancy, antepartum 2. [redacted] weeks gestation of pregnancy GBS neg IOL ordered at 39w for diet controlled GDM  3. Diet controlled gestational diabetes mellitus (GDM), antepartum - Not checking BG. Reviewed importance of continued monitoring until end of pregnancy. Reviewed that tight BG control can limit unhealthy growth of baby in the last few weeks of pregnancy AND can help baby transition after birth with less neonatal  hypoglycemia.  - Recommended getting at least fasting & 1 post prandial (preferably largest meal of day) per day - Growth [place] 4/4: @36 /3 - 2804g (39%) AC 42%, AFI 14.39, cephalic, anterior - Given AGA with diet alone and previous good control, plan for IOL at 39 weeks. Pt agreeable. IOL ordered.  4. Unwanted fertility Consent signed 06/18/22  Term labor symptoms and general obstetric precautions including but not limited to vaginal bleeding, contractions, leaking of fluid and fetal movement were reviewed in detail with the patient. Please refer to After Visit Summary for other counseling recommendations.   Return in about 1 week (around 08/25/2022) for return OB at 38 weeks.  Future Appointments  Date Time Provider Department Center  08/25/2022  2:30 PM Warden Fillers, MD [facility] None  08/31/2022  6:45 AM [facility] SCHED ROOM [facility] None   Lennart Pall, MD

## 2022-08-18 NOTE — Progress Notes (Signed)
ROB, Pt is not checking her BS.

## 2022-08-24 ENCOUNTER — Telehealth (HOSPITAL_COMMUNITY): Payer: Self-pay | Admitting: *Deleted

## 2022-08-24 ENCOUNTER — Encounter (HOSPITAL_COMMUNITY): Payer: Self-pay | Admitting: *Deleted

## 2022-08-24 NOTE — Telephone Encounter (Signed)
Preadmission screen  

## 2022-08-25 ENCOUNTER — Other Ambulatory Visit: Payer: Self-pay | Admitting: Advanced Practice Midwife

## 2022-08-25 ENCOUNTER — Ambulatory Visit (INDEPENDENT_AMBULATORY_CARE_PROVIDER_SITE_OTHER): Payer: Medicaid Other | Admitting: Obstetrics and Gynecology

## 2022-08-25 VITALS — BP 134/88 | HR 87 | Wt 227.0 lb

## 2022-08-25 DIAGNOSIS — Z3480 Encounter for supervision of other normal pregnancy, unspecified trimester: Secondary | ICD-10-CM

## 2022-08-25 DIAGNOSIS — O24410 Gestational diabetes mellitus in pregnancy, diet controlled: Secondary | ICD-10-CM

## 2022-08-25 DIAGNOSIS — Z3A38 38 weeks gestation of pregnancy: Secondary | ICD-10-CM

## 2022-08-25 DIAGNOSIS — O99210 Obesity complicating pregnancy, unspecified trimester: Secondary | ICD-10-CM

## 2022-08-25 NOTE — Progress Notes (Signed)
Pt states she is having some pain in lower back/bottom. Pt is having more pelvic pain with sitting/standing. Pt complains of continued constipation - taking Colace and Miralax.

## 2022-08-25 NOTE — Progress Notes (Signed)
   PRENATAL VISIT NOTE  Subjective:  Anita Allen is a 32 y.o. [phone] at [redacted]w[redacted]d being seen today for ongoing prenatal care.  She is currently monitored for the following issues for this high-risk pregnancy and has Difficulty maintaining weight; Rash and nonspecific skin eruption; PCOS (polycystic ovarian syndrome); LGSIL on Pap smear of cervix; Supervision of other normal pregnancy, antepartum; Obesity in pregnancy, antepartum; Gestational diabetes mellitus (GDM), antepartum; and Unwanted fertility on their problem list.  Patient doing well with no acute concerns today. She reports  general pelvic pain and soreness c/w round ligament pain .  Contractions: Irregular. Vag. Bleeding: None.  Movement: Present. Denies leaking of fluid.   The following portions of the patient's history were reviewed and updated as appropriate: allergies, current medications, past family history, past medical history, past social history, past surgical history and problem list. Problem list updated.  Objective:   Vitals:   08/25/22 1439  BP: 134/88  Pulse: 87  Weight: 227 lb (103 kg)    Fetal Status: Fetal Heart Rate (bpm): 150 Fundal Height: 38 cm Movement: Present     General:  Alert, oriented and cooperative. Patient is in no acute distress.  Skin: Skin is warm and dry. No rash noted.   Cardiovascular: Normal heart rate noted  Respiratory: Normal respiratory effort, no problems with respiration noted  Abdomen: Soft, gravid, appropriate for gestational age.  Pain/Pressure: Present     Pelvic: Cervical exam performed Dilation: Fingertip Effacement (%): 60 Station: Ballotable  Extremities: Normal range of motion.     Mental Status:  Normal mood and affect. Normal behavior. Normal judgment and thought content.   Assessment and Plan:  Pregnancy: [id number] at [redacted]w[redacted]d  1. [redacted] weeks gestation of pregnancy   2. Diet controlled gestational diabetes mellitus (GDM), antepartum FBS: 70-80s PPBS: 120-130s  3.  Obesity in pregnancy, antepartum   4. Supervision of other normal pregnancy, antepartum IOL scheduled 08/31/22 EFW 39%  Term labor symptoms and general obstetric precautions including but not limited to vaginal bleeding, contractions, leaking of fluid and fetal movement were reviewed in detail with the patient.  Please refer to After Visit Summary for other counseling recommendations.   No follow-ups on file.   Mariel Aloe, MD Faculty Attending [facility]

## 2022-08-26 ENCOUNTER — Other Ambulatory Visit: Payer: Self-pay | Admitting: Advanced Practice Midwife

## 2022-08-31 ENCOUNTER — Inpatient Hospital Stay (HOSPITAL_COMMUNITY): Payer: Medicaid Other | Admitting: Anesthesiology

## 2022-08-31 ENCOUNTER — Encounter (HOSPITAL_COMMUNITY): Payer: Self-pay | Admitting: Obstetrics & Gynecology

## 2022-08-31 ENCOUNTER — Other Ambulatory Visit: Payer: Self-pay

## 2022-08-31 ENCOUNTER — Inpatient Hospital Stay (HOSPITAL_COMMUNITY): Payer: Medicaid Other

## 2022-08-31 ENCOUNTER — Inpatient Hospital Stay (HOSPITAL_COMMUNITY)
Admission: RE | Admit: 2022-08-31 | Discharge: 2022-09-03 | DRG: 807 | Disposition: A | Discharge status: Home / Self Care | Payer: Medicaid Other | Attending: Obstetrics & Gynecology | Admitting: Obstetrics & Gynecology

## 2022-08-31 DIAGNOSIS — O24410 Gestational diabetes mellitus in pregnancy, diet controlled: Secondary | ICD-10-CM

## 2022-08-31 DIAGNOSIS — Z3A39 39 weeks gestation of pregnancy: Secondary | ICD-10-CM | POA: Diagnosis not present

## 2022-08-31 DIAGNOSIS — Z880 Allergy status to penicillin: Secondary | ICD-10-CM

## 2022-08-31 DIAGNOSIS — Z3490 Encounter for supervision of normal pregnancy, unspecified, unspecified trimester: Secondary | ICD-10-CM | POA: Diagnosis present

## 2022-08-31 DIAGNOSIS — O99214 Obesity complicating childbirth: Secondary | ICD-10-CM | POA: Diagnosis not present

## 2022-08-31 DIAGNOSIS — Z3480 Encounter for supervision of other normal pregnancy, unspecified trimester: Secondary | ICD-10-CM

## 2022-08-31 DIAGNOSIS — O2492 Unspecified diabetes mellitus in childbirth: Secondary | ICD-10-CM | POA: Diagnosis not present

## 2022-08-31 DIAGNOSIS — Z91040 Latex allergy status: Secondary | ICD-10-CM | POA: Diagnosis not present

## 2022-08-31 DIAGNOSIS — O1414 Severe pre-eclampsia complicating childbirth: Secondary | ICD-10-CM | POA: Diagnosis present

## 2022-08-31 DIAGNOSIS — O1410 Severe pre-eclampsia, unspecified trimester: Secondary | ICD-10-CM | POA: Diagnosis not present

## 2022-08-31 DIAGNOSIS — O24420 Gestational diabetes mellitus in childbirth, diet controlled: Principal | ICD-10-CM | POA: Diagnosis present

## 2022-08-31 LAB — COMPREHENSIVE METABOLIC PANEL
ALT: 22 U/L (ref 0–44)
AST: 23 U/L (ref 15–41)
Albumin: 2.6 g/dL — ABNORMAL LOW (ref 3.5–5.0)
Alkaline Phosphatase: 227 U/L — ABNORMAL HIGH (ref 38–126)
Anion gap: 9 (ref 5–15)
BUN: 5 mg/dL — ABNORMAL LOW (ref 6–20)
CO2: 18 mmol/L — ABNORMAL LOW (ref 22–32)
Calcium: 9.7 mg/dL (ref 8.9–10.3)
Chloride: 108 mmol/L (ref 98–111)
Creatinine, Ser: 0.46 mg/dL (ref 0.44–1.00)
GFR, Estimated: >60 mL/min (ref >60)
Glucose, Bld: 78 mg/dL (ref 70–99)
Potassium: 3.8 mmol/L (ref 3.5–5.1)
Sodium: 135 mmol/L (ref 135–145)
Total Bilirubin: 0.5 mg/dL (ref 0.3–1.2)
Total Protein: 6.1 g/dL — ABNORMAL LOW (ref 6.5–8.1)

## 2022-08-31 LAB — PROTEIN / CREATININE RATIO, URINE
Creatinine, Urine: 81 mg/dL
Protein Creatinine Ratio: 0.41 mg/mg{Cre} — ABNORMAL HIGH (ref 0.00–0.15)
Total Protein, Urine: 33 mg/dL

## 2022-08-31 LAB — RPR: RPR Ser Ql: NONREACTIVE

## 2022-08-31 LAB — CBC
HCT: 36.8 % (ref 36.0–46.0)
Hemoglobin: 11.8 g/dL — ABNORMAL LOW (ref 12.0–15.0)
MCH: 26.8 pg (ref 26.0–34.0)
MCHC: 32.1 g/dL (ref 30.0–36.0)
MCV: 83.6 fL (ref 80.0–100.0)
Platelets: 293 10*3/uL (ref 150–400)
RBC: 4.4 MIL/uL (ref 3.87–5.11)
RDW: 14 % (ref 11.5–15.5)
WBC: 9.4 10*3/uL (ref 4.0–10.5)
nRBC: 0 % (ref 0.0–0.2)

## 2022-08-31 LAB — GLUCOSE, CAPILLARY
Glucose-Capillary: 69 mg/dL — ABNORMAL LOW (ref 70–99)
Glucose-Capillary: 144 mg/dL — ABNORMAL HIGH (ref 70–99)

## 2022-08-31 LAB — TYPE AND SCREEN
ABO/RH(D): A POS
Antibody Screen: NEGATIVE

## 2022-08-31 MED ORDER — BUPIVACAINE HCL (PF) 0.25 % IJ SOLN
INTRAMUSCULAR | Status: DC | PRN
  Administered 2022-08-31: 10 mL via EPIDURAL

## 2022-08-31 MED ORDER — PHENYLEPHRINE 80 MCG/ML (10ML) SYRINGE FOR IV PUSH (FOR BLOOD PRESSURE SUPPORT): 80.0000 ug | PREFILLED_SYRINGE | INTRAVENOUS | Status: DC | PRN

## 2022-08-31 MED ORDER — EPHEDRINE 5 MG/ML INJ: 10.0000 mg | INTRAVENOUS | Status: DC | PRN

## 2022-08-31 MED ORDER — LABETALOL HCL 5 MG/ML IV SOLN: 40.0000 mg | INTRAVENOUS | Status: DC | PRN

## 2022-08-31 MED ORDER — LACTATED RINGERS IV SOLN: INTRAVENOUS | Status: DC

## 2022-08-31 MED ORDER — ACETAMINOPHEN 325 MG PO TABS: 650.0000 mg | ORAL_TABLET | ORAL | Status: DC | PRN

## 2022-08-31 MED ORDER — LABETALOL HCL 5 MG/ML IV SOLN
20.0000 mg | INTRAVENOUS | Status: DC | PRN
  Administered 2022-09-01: 20 mg via INTRAVENOUS
  Filled 2022-08-31: qty 4

## 2022-08-31 MED ORDER — OXYTOCIN BOLUS FROM INFUSION: 333.0000 mL | Freq: Once | INTRAVENOUS | Status: DC

## 2022-08-31 MED ORDER — MISOPROSTOL 50MCG HALF TABLET
50.0000 ug | ORAL_TABLET | Freq: Once | ORAL | Status: AC
  Administered 2022-08-31: 50 ug via ORAL
  Filled 2022-08-31: qty 1

## 2022-08-31 MED ORDER — LIDOCAINE HCL (PF) 1 % IJ SOLN: 30.0000 mL | INTRAMUSCULAR | Status: DC | PRN

## 2022-08-31 MED ORDER — OXYTOCIN-SODIUM CHLORIDE 30-0.9 UT/500ML-% IV SOLN
1.0000 m[IU]/min | INTRAVENOUS | Status: DC
  Administered 2022-08-31: 2 m[IU]/min via INTRAVENOUS
  Administered 2022-08-31: 7 m[IU]/min via INTRAVENOUS

## 2022-08-31 MED ORDER — FENTANYL-BUPIVACAINE-NACL 0.5-0.125-0.9 MG/250ML-% EP SOLN
12.0000 mL/h | EPIDURAL | Status: DC | PRN
  Administered 2022-08-31: 12 mL/h via EPIDURAL
  Filled 2022-08-31: qty 250

## 2022-08-31 MED ORDER — LIDOCAINE HCL (PF) 1 % IJ SOLN
INTRAMUSCULAR | Status: DC | PRN
  Administered 2022-08-31: 11 mL via EPIDURAL

## 2022-08-31 MED ORDER — LACTATED RINGERS IV SOLN: 500.0000 mL | INTRAVENOUS | Status: DC | PRN

## 2022-08-31 MED ORDER — MISOPROSTOL 25 MCG QUARTER TABLET
25.0000 ug | ORAL_TABLET | Freq: Once | ORAL | Status: AC
  Administered 2022-08-31: 25 ug via VAGINAL
  Filled 2022-08-31: qty 1

## 2022-08-31 MED ORDER — OXYTOCIN-SODIUM CHLORIDE 30-0.9 UT/500ML-% IV SOLN
2.5000 [IU]/h | INTRAVENOUS | Status: DC
  Filled 2022-08-31 (×2): qty 500

## 2022-08-31 MED ORDER — LABETALOL HCL 5 MG/ML IV SOLN: 80.0000 mg | INTRAVENOUS | Status: DC | PRN

## 2022-08-31 MED ORDER — DIPHENHYDRAMINE HCL 50 MG/ML IJ SOLN: 12.5000 mg | INTRAMUSCULAR | Status: DC | PRN

## 2022-08-31 MED ORDER — TERBUTALINE SULFATE 1 MG/ML IJ SOLN: 0.2500 mg | Freq: Once | INTRAMUSCULAR | Status: DC | PRN

## 2022-08-31 MED ORDER — FENTANYL CITRATE (PF) 100 MCG/2ML IJ SOLN
50.0000 ug | INTRAMUSCULAR | Status: DC | PRN
  Administered 2022-08-31 (×2): 100 ug via INTRAVENOUS
  Filled 2022-08-31 (×2): qty 2

## 2022-08-31 MED ORDER — SOD CITRATE-CITRIC ACID 500-334 MG/5ML PO SOLN: 30.0000 mL | ORAL | Status: DC | PRN

## 2022-08-31 MED ORDER — HYDRALAZINE HCL 20 MG/ML IJ SOLN: 10.0000 mg | INTRAMUSCULAR | Status: DC | PRN

## 2022-08-31 MED ORDER — LACTATED RINGERS IV SOLN: 500.0000 mL | Freq: Once | INTRAVENOUS | Status: DC

## 2022-08-31 MED ORDER — PHENYLEPHRINE 80 MCG/ML (10ML) SYRINGE FOR IV PUSH (FOR BLOOD PRESSURE SUPPORT)
80.0000 ug | PREFILLED_SYRINGE | INTRAVENOUS | Status: DC | PRN
  Filled 2022-08-31: qty 10

## 2022-08-31 MED ORDER — ONDANSETRON HCL 4 MG/2ML IJ SOLN
4.0000 mg | Freq: Four times a day (QID) | INTRAMUSCULAR | Status: DC | PRN
  Administered 2022-09-01: 4 mg via INTRAVENOUS
  Filled 2022-08-31: qty 2

## 2022-08-31 NOTE — Plan of Care (Signed)
  Problem: Education: Goal: Knowledge of General Education information will improve Description: Including pain rating scale, medication(s)/side effects and non-pharmacologic comfort measures Outcome: Progressing   Problem: Health Behavior/Discharge Planning: Goal: Ability to manage health-related needs will improve Outcome: Progressing   Problem: Clinical Measurements: Goal: Ability to maintain clinical measurements within normal limits will improve Outcome: Progressing Goal: Will remain free from infection Outcome: Progressing Goal: Diagnostic test results will improve Outcome: Progressing Goal: Respiratory complications will improve Outcome: Progressing Goal: Cardiovascular complication will be avoided Outcome: Progressing   Problem: Activity: Goal: Risk for activity intolerance will decrease Outcome: Progressing   Problem: Nutrition: Goal: Adequate nutrition will be maintained Outcome: Progressing   Problem: Coping: Goal: Level of anxiety will decrease Outcome: Progressing   Problem: Elimination: Goal: Will not experience complications related to bowel motility Outcome: Progressing Goal: Will not experience complications related to urinary retention Outcome: Progressing   Problem: Pain Managment: Goal: General experience of comfort will improve Outcome: Progressing   Problem: Safety: Goal: Ability to remain free from injury will improve Outcome: Progressing   Problem: Skin Integrity: Goal: Risk for impaired skin integrity will decrease Outcome: Progressing   Problem: Education: Goal: Knowledge of Childbirth will improve Outcome: Progressing Goal: Ability to make informed decisions regarding treatment and plan of care will improve Outcome: Progressing Goal: Ability to state and carry out methods to decrease the pain will improve Outcome: Progressing Goal: Individualized Educational Video(s) Outcome: Progressing   Problem: Coping: Goal: Ability to  verbalize concerns and feelings about labor and delivery will improve Outcome: Progressing   Problem: Life Cycle: Goal: Ability to make normal progression through stages of labor will improve Outcome: Progressing Goal: Ability to effectively push during vaginal delivery will improve Outcome: Progressing   Problem: Role Relationship: Goal: Will demonstrate positive interactions with the child Outcome: Progressing   Problem: Safety: Goal: Risk of complications during labor and delivery will decrease Outcome: Progressing   Problem: Pain Management: Goal: Relief or control of pain from uterine contractions will improve Outcome: Progressing   Problem: Education: Goal: Knowledge of disease or condition will improve Outcome: Progressing Goal: Knowledge of the prescribed therapeutic regimen will improve Outcome: Progressing   Problem: Fluid Volume: Goal: Peripheral tissue perfusion will improve Outcome: Progressing   Problem: Clinical Measurements: Goal: Complications related to disease process, condition or treatment will be avoided or minimized Outcome: Progressing   

## 2022-08-31 NOTE — Anesthesia Procedure Notes (Signed)
Epidural  Start time: 08/31/2022 9:58 PM End time: 08/31/2022 10:18 PM  Staffing Anesthesiologist: Lowella Curb, MD Performed: anesthesiologist   Epidural Location: L2-L3 Injection technique: LOR saline  Needle:  Needle insertion depth: 7 cm Catheter at skin depth: 11 cm

## 2022-08-31 NOTE — Anesthesia Preprocedure Evaluation (Signed)
Anesthesia Evaluation  Patient identified by MRN, date of birth, ID band Patient awake    Reviewed: Allergy & Precautions, H&P , NPO status , Patient's Chart, lab work & pertinent test results  Airway Mallampati: II  TM Distance: >3 FB Neck ROM: full    Dental  (+) Teeth Intact   Pulmonary neg pulmonary ROS   breath sounds clear to auscultation       Cardiovascular negative cardio ROS  Rhythm:regular Rate:Normal     Neuro/Psych negative neurological ROS  negative psych ROS   GI/Hepatic negative GI ROS, Neg liver ROS,,,  Endo/Other  negative endocrine ROSdiabetes    Renal/GU negative Renal ROS  negative genitourinary   Musculoskeletal negative musculoskeletal ROS (+)    Abdominal   Peds negative pediatric ROS (+)  Hematology negative hematology ROS (+)   Anesthesia Other Findings  Recent trauma; (-) head CT & C Spine CT Low K+    Reproductive/Obstetrics negative OB ROS                              Anesthesia Physical Anesthesia Plan  ASA: II  Anesthesia Plan: Epidural   Post-op Pain Management:    Induction:   PONV Risk Score and Plan:   Airway Management Planned:   Additional Equipment:   Intra-op Plan:   Post-operative Plan:   Informed Consent: I have reviewed the patients History and Physical, chart, labs and discussed the procedure including the risks, benefits and alternatives for the proposed anesthesia with the patient or authorized representative who has indicated his/her understanding and acceptance.     Dental Advisory Given  Plan Discussed with:   Anesthesia Plan Comments: (Labs checked- platelets confirmed with RN in room. Fetal heart tracing, per RN, reported to be stable enough for sitting procedure. Discussed epidural, and patient consents to the procedure:  included risk of possible headache,backache, failed block, allergic reaction, and nerve  injury. This patient was asked if she had any questions or concerns before the procedure started.)         Anesthesia Quick Evaluation

## 2022-08-31 NOTE — Progress Notes (Signed)
Anita Allen is a 32 y.o. [phone] at [redacted]w[redacted]d by LMP admitted for induction of labor due to Gestational diabetes with PreE dx'd by PCR of 0.4.   Subjective: Patient doing well. Request to have her water broken.   Objective: BP (!) 133/95   Pulse 87   Temp 97.7 F (36.5 C) (Axillary)   Resp 17   LMP 12/01/2021 (Approximate)   SpO2 100%  No intake/output data recorded. No intake/output data recorded.  FHT:  FHR: 125 bpm, variability: moderate,  accelerations:  Present,  decelerations:  occasional variables with quick return to baseline. Patient moving around in bed, and monitors reading maternal.  UC:   irregular, every 2-7 minutes SVE:   Dilation: 4.5 Effacement (%): 80 Station: -2 Exam by:: Dorathy Daft CNM  Labs: Lab Results  Component Value Date   WBC 9.4 08/31/2022   HGB 11.8 (L) 08/31/2022   HCT 36.8 08/31/2022   MCV 83.6 08/31/2022   PLT 293 08/31/2022  Patient Vitals for the past 24 hrs:  BP Temp Temp src Pulse Resp SpO2  08/31/22 2120 (!) 133/95 -- -- 87 17 --  08/31/22 2050 139/79 -- -- 79 18 --  08/31/22 2015 (!) 146/92 97.7 F (36.5 C) Axillary 71 18 --  08/31/22 1930 (!) 128/58 -- -- 81 17 --  08/31/22 1821 (!) 164/94 -- -- 94 -- --  08/31/22 1731 136/82 -- -- 82 -- --  08/31/22 1701 (!) 140/82 -- -- 93 -- --  08/31/22 1631 133/79 -- -- 83 -- --  08/31/22 1601 (!) 142/79 -- -- 85 -- --  08/31/22 1531 135/79 -- -- 94 -- --  08/31/22 1501 139/83 -- -- 94 -- --  08/31/22 1431 113/64 98.1 F (36.7 C) Oral 90 18 100 %  08/31/22 1404 (!) 115/58 -- -- 86 16 --  08/31/22 1330 125/73 -- -- 95 16 --  08/31/22 1325 132/69 -- -- 90 16 --  08/31/22 1258 (!) 145/77 -- -- 86 16 100 %  08/31/22 1120 (!) 149/92 98.1 F (36.7 C) Oral 80 18 100 %  08/31/22 1009 (!) 140/91 98.1 F (36.7 C) Oral 77 18 100 %  08/31/22 0907 (!) 149/88 97.7 F (36.5 C) Axillary 69 18 100 %  08/31/22 0805 (!) 148/96 98 F (36.7 C) Oral 83 18 100 %    CNM to patient bedside to discuss  AROM. Both Risks and benefits reviewed with patient and family at bedside. Patient agrees with plan of care and would like to proceed with procedure. FHT Cat II prior to AROM. 4.5cm anterior cervix noted with fetal head well applied. AROM completed without difficulty. Small amount of pink tingled fluid noted and bloody show on glove upon completion. FHT remained Cat II post AROM.   Assessment / Plan: Induction of labor due to preeclampsia and gestational diabetes,  progressing well on pitocin  Labor:  Assessed for AROM. AROM completed pink tinged fluid noted. Continue to assess. Continue to titrate Pit up 2x2 as needed.  Preeclampsia:   BPs continue to be Elevated. Not severe.  Continue to assess.  Fetal Wellbeing:  Category II- continuous monitoring on Pit.  Pain Control:  Epidural and IV pain meds- Patient may have epidural upon request  I/D:   GBS neg  Anticipated MOD:  NSVD  Claudette Head, CNM 08/31/2022, 9:52 PM

## 2022-08-31 NOTE — Progress Notes (Signed)
CBG 69. Pt asymptomatic. Soda provided to pt.

## 2022-08-31 NOTE — H&P (Addendum)
LABOR AND DELIVERY ADMISSION HISTORY AND PHYSICAL NOTE  Anita Allen is a 32 y.o. female [phone] with IUP at [redacted]w[redacted]d by LMP presenting for IOL 2/2 A1GDM.   She reports positive fetal movement. She denies leakage of fluid, vaginal bleeding, or contractions.   She plans on breast feeding. Her contraception plan is: inpatient BTL.  Prenatal History/Complications: PNC at [facility]:  , CWD, normal anatomy, cephalic presentation, Anterior placenta, 39%ile, EFW 2804g  Pregnancy complications:  - A1GDM - Maternal obesity (BMI 40)  Past Medical History: Past Medical History:  Diagnosis Date   BV (bacterial vaginosis)    Gestational diabetes    Gonorrhea contact, treated    H/O dilation and curettage    Headache    Hx of trichomoniasis    Molar pregnancy 12/2013   Ovarian cyst    PCOS (polycystic ovarian syndrome)    UTI (urinary tract infection)    Vaginal delivery 2012, 2013   Yeast infection     Past Surgical History: Past Surgical History:  Procedure Laterality Date   brazillian butt lift     DILATION AND CURETTAGE OF UTERUS     DILATION AND EVACUATION N/A 12/20/2013   Procedure: DILATATION AND EVACUATION;  Surgeon: Adam Phenix, MD;  Location: [facility];  Service: Gynecology;  Laterality: N/A;   LIPOSUCTION TRUNK  2021    Obstetrical History: OB History     Gravida  7   Para  3   Term  3   Preterm  0   AB  3   Living  3      SAB  1   IAB  2   Ectopic  0   Multiple  0   Live Births  3           Social History: Social History   Socioeconomic History   Marital status: Legally Separated    Spouse name: Not on file   Number of children: Not on file   Years of education: Not on file   Highest education level: Not on file  Occupational History   Not on file  Tobacco Use   Smoking status: Never   Smokeless tobacco: Never  Vaping Use   Vaping Use: Never used  Substance and Sexual Activity   Alcohol use: Not Currently    Comment:  not often, not since confirmed pregnancy   Drug use: No   Sexual activity: Yes    Partners: Male    Birth control/protection: None    Comment: Currently pregnant  Other Topics Concern   Not on file  Social History Narrative   ** Merged History Encounter **       Social Determinants of Health   Financial Resource Strain: Not on file  Food Insecurity: Food Insecurity Present (07/09/2022)   Hunger Vital Sign    Worried About Running Out of Food in the Last Year: Never true    Ran Out of Food in the Last Year: Sometimes true  Transportation Needs: Not on file  Physical Activity: Not on file  Stress: Not on file  Social Connections: Not on file    Family History: Family History  Problem Relation Age of Onset   Hypertension Mother    Heart attack Mother    Thyroid disease Mother    Heart disease Father    Hypertension Father    Heart attack Father     Allergies: Allergies  Allergen Reactions   Augmentin [Amoxicillin-Pot Clavulanate] Hives and Other (See Comments)  Pt has taken Ceftriaxone in the past (??)   Latex Rash    Vaginal irritation with condoms    Medications Prior to Admission  Medication Sig Dispense Refill Last Dose   Accu-Chek Softclix Lancets lancets Check blood glucose 4 times daily via percutaneous route 100 each 12    aspirin 81 MG chewable tablet Chew 1 tablet (81 mg total) by mouth daily. 30 tablet 6    Blood Glucose Monitoring Suppl (ACCU-CHEK GUIDE) w/Device KIT 1 Device by Percutaneous route 4 (four) times daily. 1 kit 0    Blood Pressure Monitoring (BLOOD PRESSURE KIT) DEVI 1 kit by Does not apply route once a week. 1 each 0    docusate sodium (COLACE) 100 MG capsule Take 1 capsule (100 mg total) by mouth 2 (two) times daily. 60 capsule 5    famotidine (PEPCID) 40 MG tablet Take 0.5 tablets (20 mg total) by mouth at bedtime. 30 tablet 2    glucose blood (ACCU-CHEK GUIDE) test strip Check blood glucose 4 times daily via percutaneous route 50 each  12    pantoprazole (PROTONIX) 20 MG tablet Take 1 tablet (20 mg total) by mouth daily. 60 tablet 5    polyethylene glycol powder (GLYCOLAX/MIRALAX) 17 GM/SCOOP powder Take 17 g by mouth daily as needed. 510 g 11    Prenat-Fe Poly-Methfol-FA-DHA (VITAFOL ULTRA) 29-0.6-0.4-200 MG CAPS Take 1 capsule by mouth daily. 30 capsule 11    Prenat-FeFmCb-DSS-FA-DHA w/o A (CITRANATAL HARMONY) 27-1-260 MG CAPS Take 1 capsule by mouth daily before breakfast. 30 capsule 90     Review of Systems  All systems reviewed and negative except as stated in HPI  Physical Exam Last menstrual period 12/01/2021. General appearance: alert, oriented Abdomen: soft, non-tender; gravid Extremities: No calf swelling or tenderness Presentation: cephalic  Fetal monitoring: Baseline 140, mod var, + accels, no decels Uterine activity: regular every 3-5 mins     Prenatal labs: ABO, Rh: A/Positive/-- (10/19 1155) Antibody: Negative (10/19 1155) Rubella: 4.22 (10/19 1155) IMMUNE RPR: Non Reactive (02/08 0926)  HBsAg: Negative (10/19 1155)  HIV: Non Reactive (02/08 0926)  GC/Chlamydia: Neg  GBS: Negative/-- (04/04 1443)  2-hr GTT: Abnormal Genetic screening:  Low risk female Anatomy US: WNL  Prenatal Transfer Tool  Maternal Diabetes: Yes:  Diabetes Type:  Diet controlled Genetic Screening: Normal Maternal Ultrasounds/Referrals: Normal Fetal Ultrasounds or other Referrals:  Referred to Materal Fetal Medicine  Maternal Substance Abuse:  No Significant Maternal Medications:  None Significant Maternal Lab Results: Group B Strep negative  No results found for this or any previous visit (from the past 24 hour(s)).  Patient Active Problem List   Diagnosis Date Noted   Unwanted fertility 08/13/2022   Gestational diabetes mellitus (GDM), antepartum 07/09/2022   Obesity in pregnancy, antepartum 02/26/2022   Supervision of other normal pregnancy, antepartum 02/19/2022   LGSIL on Pap smear of cervix 06/23/2021    Difficulty maintaining weight 06/19/2021   Rash and nonspecific skin eruption 06/19/2021   PCOS (polycystic ovarian syndrome) 06/19/2021    Assessment: Anita Allen is a 32 y.o. [id number] at [redacted]w[redacted]d here for IOL 2/2 A1GDM  #Labor: Cytotec 50 mcg/25 mcg (given 0810). Will continue to monitor  #Pain: IV pain meds PRN, epidural upon request #FWB: Cat II tracing due to variables and decel X 1 #GBS/ID: Neg #MOF: Breast #MOC: BTL (consent signed 06/18/2022) #Circ: N/A baby girl  #A1GDM -BG q4h during latent phase, q2h during active phase  #Silent carrier of Alpha Thal Baseline hemoglobin: 11.8,  MCV 83.6 -Monitor hemoglobin   Gilles Chiquito, MD PGY-3 08/31/2022 11:00 AM  Attestation of Supervision of Student:  I confirm that I have verified the information documented in the  resident 's note and that I have also personally reperformed the history, physical exam and all medical decision making activities.  I have verified that all services and findings are accurately documented in this student's note; and I agree with management and plan as outlined in the documentation. I have also made any necessary editorial changes.  Editing done above.  Raelyn Mora, CNM [facility] [employer], [facility] 08/31/2022 11:38 AM

## 2022-09-01 ENCOUNTER — Encounter (HOSPITAL_COMMUNITY): Payer: Self-pay | Admitting: Obstetrics and Gynecology

## 2022-09-01 DIAGNOSIS — O99214 Obesity complicating childbirth: Secondary | ICD-10-CM | POA: Diagnosis not present

## 2022-09-01 DIAGNOSIS — O1410 Severe pre-eclampsia, unspecified trimester: Secondary | ICD-10-CM

## 2022-09-01 DIAGNOSIS — O1414 Severe pre-eclampsia complicating childbirth: Secondary | ICD-10-CM | POA: Diagnosis not present

## 2022-09-01 DIAGNOSIS — O24420 Gestational diabetes mellitus in childbirth, diet controlled: Secondary | ICD-10-CM | POA: Diagnosis not present

## 2022-09-01 DIAGNOSIS — Z3A39 39 weeks gestation of pregnancy: Secondary | ICD-10-CM | POA: Diagnosis not present

## 2022-09-01 HISTORY — DX: Severe pre-eclampsia, unspecified trimester: O14.10

## 2022-09-01 LAB — COMPREHENSIVE METABOLIC PANEL
ALT: 21 U/L (ref 0–44)
AST: 27 U/L (ref 15–41)
Albumin: 2.4 g/dL — ABNORMAL LOW (ref 3.5–5.0)
Alkaline Phosphatase: 229 U/L — ABNORMAL HIGH (ref 38–126)
Anion gap: 9 (ref 5–15)
BUN: 5 mg/dL — ABNORMAL LOW (ref 6–20)
CO2: 18 mmol/L — ABNORMAL LOW (ref 22–32)
Calcium: 10.6 mg/dL — ABNORMAL HIGH (ref 8.9–10.3)
Chloride: 107 mmol/L (ref 98–111)
Creatinine, Ser: 0.67 mg/dL (ref 0.44–1.00)
GFR, Estimated: >60 mL/min (ref >60)
Glucose, Bld: 112 mg/dL — ABNORMAL HIGH (ref 70–99)
Potassium: 3.6 mmol/L (ref 3.5–5.1)
Sodium: 134 mmol/L — ABNORMAL LOW (ref 135–145)
Total Bilirubin: 1 mg/dL (ref 0.3–1.2)
Total Protein: 6.1 g/dL — ABNORMAL LOW (ref 6.5–8.1)

## 2022-09-01 LAB — CBC WITH DIFFERENTIAL/PLATELET
Abs Immature Granulocytes: 0.15 10*3/uL — ABNORMAL HIGH (ref 0.00–0.07)
Basophils Absolute: 0.1 10*3/uL (ref 0.0–0.1)
Basophils Relative: 0 %
Eosinophils Absolute: 0 10*3/uL (ref 0.0–0.5)
Eosinophils Relative: 0 %
HCT: 36.9 % (ref 36.0–46.0)
Hemoglobin: 12.3 g/dL (ref 12.0–15.0)
Immature Granulocytes: 1 %
Lymphocytes Relative: 6 %
Lymphs Abs: 1.5 10*3/uL (ref 0.7–4.0)
MCH: 27.1 pg (ref 26.0–34.0)
MCHC: 33.3 g/dL (ref 30.0–36.0)
MCV: 81.3 fL (ref 80.0–100.0)
Monocytes Absolute: 1.6 10*3/uL — ABNORMAL HIGH (ref 0.1–1.0)
Monocytes Relative: 7 %
Neutro Abs: 20.1 10*3/uL — ABNORMAL HIGH (ref 1.7–7.7)
Neutrophils Relative %: 86 %
Platelets: 296 10*3/uL (ref 150–400)
RBC: 4.54 MIL/uL (ref 3.87–5.11)
RDW: 14.2 % (ref 11.5–15.5)
WBC: 23.5 10*3/uL — ABNORMAL HIGH (ref 4.0–10.5)
nRBC: 0 % (ref 0.0–0.2)

## 2022-09-01 LAB — GLUCOSE, CAPILLARY
Glucose-Capillary: 70 mg/dL (ref 70–99)
Glucose-Capillary: 114 mg/dL — ABNORMAL HIGH (ref 70–99)

## 2022-09-01 MED ORDER — SIMETHICONE 80 MG PO CHEW: 80.0000 mg | CHEWABLE_TABLET | ORAL | Status: DC | PRN

## 2022-09-01 MED ORDER — ZOLPIDEM TARTRATE 5 MG PO TABS: 5.0000 mg | ORAL_TABLET | Freq: Every evening | ORAL | Status: DC | PRN

## 2022-09-01 MED ORDER — HYDRALAZINE HCL 20 MG/ML IJ SOLN: 10.0000 mg | INTRAMUSCULAR | Status: DC | PRN

## 2022-09-01 MED ORDER — ACETAMINOPHEN 325 MG PO TABS
650.0000 mg | ORAL_TABLET | ORAL | Status: DC | PRN
  Administered 2022-09-02: 650 mg via ORAL
  Filled 2022-09-01: qty 2

## 2022-09-01 MED ORDER — ONDANSETRON HCL 4 MG/2ML IJ SOLN: 4.0000 mg | INTRAMUSCULAR | Status: DC | PRN

## 2022-09-01 MED ORDER — BENZOCAINE-MENTHOL 20-0.5 % EX AERO: 1.0000 | INHALATION_SPRAY | CUTANEOUS | Status: DC | PRN

## 2022-09-01 MED ORDER — NIFEDIPINE ER OSMOTIC RELEASE 30 MG PO TB24
30.0000 mg | ORAL_TABLET | Freq: Every day | ORAL | Status: DC
  Administered 2022-09-01 – 2022-09-02 (×2): 30 mg via ORAL
  Filled 2022-09-01 (×2): qty 1

## 2022-09-01 MED ORDER — SENNOSIDES-DOCUSATE SODIUM 8.6-50 MG PO TABS
2.0000 | ORAL_TABLET | Freq: Every day | ORAL | Status: DC
  Administered 2022-09-03: 2 via ORAL
  Filled 2022-09-01 (×2): qty 2

## 2022-09-01 MED ORDER — LABETALOL HCL 5 MG/ML IV SOLN: 80.0000 mg | INTRAVENOUS | Status: DC | PRN

## 2022-09-01 MED ORDER — TETANUS-DIPHTH-ACELL PERTUSSIS 5-2.5-18.5 LF-MCG/0.5 IM SUSY: 0.5000 mL | PREFILLED_SYRINGE | Freq: Once | INTRAMUSCULAR | Status: DC

## 2022-09-01 MED ORDER — MAGNESIUM SULFATE 40 GM/1000ML IV SOLN
2.0000 g/h | INTRAVENOUS | Status: AC
  Administered 2022-09-01 (×2): 2 g/h via INTRAVENOUS
  Filled 2022-09-01 (×2): qty 1000

## 2022-09-01 MED ORDER — ACETAMINOPHEN 500 MG PO TABS
1000.0000 mg | ORAL_TABLET | Freq: Once | ORAL | Status: AC
  Administered 2022-09-01: 1000 mg via ORAL
  Filled 2022-09-01: qty 2

## 2022-09-01 MED ORDER — FUROSEMIDE 20 MG PO TABS: 10.0000 mg | ORAL_TABLET | Freq: Every day | ORAL | Status: DC

## 2022-09-01 MED ORDER — LABETALOL HCL 5 MG/ML IV SOLN: 40.0000 mg | INTRAVENOUS | Status: DC | PRN

## 2022-09-01 MED ORDER — PRENATAL MULTIVITAMIN CH
1.0000 | ORAL_TABLET | Freq: Every day | ORAL | Status: DC
  Administered 2022-09-01 – 2022-09-03 (×3): 1 via ORAL
  Filled 2022-09-01 (×3): qty 1

## 2022-09-01 MED ORDER — IBUPROFEN 600 MG PO TABS
600.0000 mg | ORAL_TABLET | Freq: Four times a day (QID) | ORAL | Status: DC
  Administered 2022-09-01 – 2022-09-03 (×9): 600 mg via ORAL
  Filled 2022-09-01 (×9): qty 1

## 2022-09-01 MED ORDER — MAGNESIUM SULFATE BOLUS VIA INFUSION
4.0000 g | Freq: Once | INTRAVENOUS | Status: AC
  Administered 2022-09-01: 4 g via INTRAVENOUS
  Filled 2022-09-01: qty 1000

## 2022-09-01 MED ORDER — DIBUCAINE (PERIANAL) 1 % EX OINT: 1.0000 | TOPICAL_OINTMENT | CUTANEOUS | Status: DC | PRN

## 2022-09-01 MED ORDER — ONDANSETRON HCL 4 MG PO TABS: 4.0000 mg | ORAL_TABLET | ORAL | Status: DC | PRN

## 2022-09-01 MED ORDER — FUROSEMIDE 20 MG PO TABS
20.0000 mg | ORAL_TABLET | Freq: Two times a day (BID) | ORAL | Status: DC
  Administered 2022-09-01 – 2022-09-03 (×5): 20 mg via ORAL
  Filled 2022-09-01 (×5): qty 1

## 2022-09-01 MED ORDER — WITCH HAZEL-GLYCERIN EX PADS: 1.0000 | MEDICATED_PAD | CUTANEOUS | Status: DC | PRN

## 2022-09-01 MED ORDER — LABETALOL HCL 5 MG/ML IV SOLN: 20.0000 mg | INTRAVENOUS | Status: DC | PRN

## 2022-09-01 MED ORDER — LACTATED RINGERS IV SOLN: INTRAVENOUS | Status: AC

## 2022-09-01 MED ORDER — DIPHENHYDRAMINE HCL 25 MG PO CAPS: 25.0000 mg | ORAL_CAPSULE | Freq: Four times a day (QID) | ORAL | Status: DC | PRN

## 2022-09-01 MED ORDER — COCONUT OIL OIL: 1.0000 | TOPICAL_OIL | Status: DC | PRN

## 2022-09-01 NOTE — Progress Notes (Signed)
Post Partum Day 0 Subjective: Post delivery patient complains of new onset headache.   Objective: Blood pressure (!) 153/88, pulse 80, temperature 98.2 F (36.8 C), temperature source Oral, resp. rate 17, last menstrual period 12/01/2021, SpO2 100 %. Patient Vitals for the past 24 hrs:  BP Temp Temp src Pulse Resp SpO2  09/01/22 0445 (!) 153/88 -- -- 80 -- --  09/01/22 0430 (!) 161/94 -- -- 78 -- --  09/01/22 0425 (!) 160/88 -- -- 72 -- --  09/01/22 0410 (!) 156/84 -- -- 77 17 --  09/01/22 0340 134/80 98.2 F (36.8 C) Oral 80 16 --  09/01/22 0330 (!) 139/95 -- -- 83 17 --  09/01/22 0230 134/71 -- -- 89 17 --  09/01/22 0200 134/86 -- -- 80 17 --  09/01/22 0130 (!) 157/83 -- -- 77 16 --  09/01/22 0100 (!) 157/86 -- -- 71 17 --  09/01/22 0030 128/80 -- -- 81 18 --  09/01/22 0000 133/72 -- -- 70 17 --  08/31/22 2345 139/77 -- -- 71 18 --  08/31/22 2340 (!) 146/83 -- -- 73 17 --  08/31/22 2335 (!) 156/89 -- -- 75 18 --  08/31/22 2330 (!) 157/90 -- -- 79 17 --  08/31/22 2300 (!) 130/101 -- -- 86 18 --  08/31/22 2248 (!) 159/94 98.3 F (36.8 C) Oral 76 17 --  08/31/22 2245 (!) 159/94 -- -- 76 18 --  08/31/22 2235 (!) 166/88 -- -- 87 18 --  08/31/22 2225 (!) 158/82 -- -- 84 17 --  08/31/22 2220 (!) 155/71 -- -- 71 18 --  08/31/22 2210 (!) 155/98 -- -- 71 17 --  08/31/22 2130 (!) 148/91 -- -- 78 18 --  08/31/22 2120 (!) 133/95 -- -- 87 17 --  08/31/22 2050 139/79 -- -- 79 18 --  08/31/22 2015 (!) 146/92 97.7 F (36.5 C) Axillary 71 18 --  08/31/22 1930 (!) 128/58 -- -- 81 17 --  08/31/22 1821 (!) 164/94 -- -- 94 -- --  08/31/22 1731 136/82 -- -- 82 -- --  08/31/22 1701 (!) 140/82 -- -- 93 -- --  08/31/22 1631 133/79 -- -- 83 -- --  08/31/22 1601 (!) 142/79 -- -- 85 -- --  08/31/22 1531 135/79 -- -- 94 -- --  08/31/22 1501 139/83 -- -- 94 -- --  08/31/22 1431 113/64 98.1 F (36.7 C) Oral 90 18 100 %  08/31/22 1404 (!) 115/58 -- -- 86 16 --  08/31/22 1330 125/73 -- -- 95 16 --   08/31/22 1325 132/69 -- -- 90 16 --  08/31/22 1258 (!) 145/77 -- -- 86 16 100 %  08/31/22 1120 (!) 149/92 98.1 F (36.7 C) Oral 80 18 100 %  08/31/22 1009 (!) 140/91 98.1 F (36.7 C) Oral 77 18 100 %  08/31/22 0907 (!) 149/88 97.7 F (36.5 C) Axillary 69 18 100 %  08/31/22 0805 (!) 148/96 98 F (36.7 C) Oral 83 18 100 %    Physical Exam:  General: alert and cooperative Lochia: appropriate Uterine Fundus: firm DVT Evaluation: No evidence of DVT seen on physical exam, mild bilateral ankle edema.   Recent Labs    08/31/22 0736  HGB 11.8*  HCT 36.8    Assessment/Plan: - Plan to initiate labetalol protocol with new severe range BPs in the presence of a headache in the immediate postpartum period.  - Also start MAG with 4 gr load and 2 gram maintenance.    LOS: 1  day   Claudette Head, CNM 09/01/2022, 4:51 AM

## 2022-09-01 NOTE — Lactation Note (Signed)
This note was copied from a baby's chart. Lactation Consultation Note  Patient Name: Anita Allen Today's Date: 09/01/2022 Age:32 hours Reason for consult: Initial assessment  P4, Mother states she would like to breastfeed and pump and give volume back to baby.  Discussed it may take a few pumping sessions to receive volume and would be best to put infant to breast.  Mother eating at this time and requested assistance later today.  Declined DEBP.  Mother states she plans to use her personal Mom Cozi hands free pump.  Set up cleaning and drying station for pump parts.   Lactation information sheet given.   Maternal Data Does the patient have breastfeeding experience prior to this delivery?: Yes How long did the patient breastfeed?: 3-4 mos  Feeding Mother's Current Feeding Choice: Breast Milk Lactation Tools Discussed/Used Tools: Pump (personal Mom Cozi hands free) Breast pump type:  (hands free) Pump Education: Setup, frequency, and cleaning Reason for Pumping: Mother's choice  Interventions Interventions: Education  Discharge Pump: Personal;Hands Free  Consult Status Consult Status: Follow-up Date: 09/02/22 Follow-up type: In-patient    Dahlia Byes [facility] 09/01/2022, 8:37 AM

## 2022-09-01 NOTE — Discharge Summary (Signed)
Postpartum Discharge Summary  Date of Service updated-4/25     Patient Name: Anita Allen DOB: January 12, 1991 MRN: [id number]  Date of admission: 08/31/2022 Delivery date:09/01/2022  Delivering provider: Carlynn Herald  Date of discharge: 09/03/2022  Admitting diagnosis: Encounter for induction of labor [Z34.90] A1GDM and Pre-Eclampsia due to elevated PCR ratio  Intrauterine pregnancy: [redacted]w[redacted]d     Secondary diagnosis:  Principal Problem:   Encounter for induction of labor Active Problems:   Severe pre-eclampsia  Additional problems: none    Discharge diagnosis: Term Pregnancy Delivered and Preeclampsia (severe)                                              Post partum procedures: none Augmentation: AROM and Pitocin Complications: None  Hospital course: Induction of Labor With Vaginal Delivery   32 y.o. yo [phone] at [redacted]w[redacted]d was admitted to the hospital 08/31/2022 for induction of labor.  Indication for induction: Preeclampsia and A1 DM.  Patient had an labor course that was uncoomplicated Membrane Rupture Time/Date: 9:09 PM ,08/31/2022   Delivery Method:Vaginal, Spontaneous  Episiotomy: None  Lacerations:  None  Details of delivery can be found in separate delivery note.  Patient had a postpartum course complicated by preeclampsia and she was treated with IV Magnesium, Lasix and anti-hypertensives. Patient is discharged home 09/03/22.  Newborn Data: Birth date:09/01/2022  Birth time:3:13 AM  Gender:Female  Living status:Living  Apgars:9 ,9  Weight:3320 g   Magnesium Sulfate received: Yes: Seizure prophylaxis BMZ received: No Rhophylac:No MMR:N/A T-DaP:Given prenatally Flu: N/A Transfusion:No  Physical exam  Vitals:   09/02/22 1952 09/03/22 0019 09/03/22 0411 09/03/22 0726  BP: (!) 154/80 138/83 (!) 150/62 (!) 152/89  Pulse: 81 78 81 78  Resp: Temp: 98.2 F (36.8 C) 98 F (36.7 C) 98.2 F (36.8 C) 98.2 F (36.8 C)  TempSrc: Oral Oral Oral Oral   SpO2: 100% 100% 100% 100%   General: alert, cooperative, and no distress Lochia: appropriate Uterine Fundus: firm Incision: N/A DVT Evaluation: No evidence of DVT seen on physical exam. Labs: Lab Results  Component Value Date   WBC 10.8 (H) 09/02/2022   HGB 10.1 (L) 09/02/2022   HCT 30.8 (L) 09/02/2022   MCV 83.9 09/02/2022   PLT 269 09/02/2022      Latest Ref Rng & Units 09/01/2022    5:20 AM  CMP  Glucose 70 - 99 mg/dL 147   BUN 6 - 20 mg/dL 5   Creatinine 8.29 - 5.62 mg/dL 1.30   Sodium 865 - 784 mmol/L 134   Potassium 3.5 - 5.1 mmol/L 3.6   Chloride 98 - 111 mmol/L 107   CO2 22 - 32 mmol/L 18   Calcium 8.9 - 10.3 mg/dL 69.6   Total Protein 6.5 - 8.1 g/dL 6.1   Total Bilirubin 0.3 - 1.2 mg/dL 1.0   Alkaline Phos 38 - 126 U/L 229   AST 15 - 41 U/L 27   ALT 0 - 44 U/L 21    Edinburgh Score:     No data to display           After visit meds:  Allergies as of 09/03/2022       Reactions   Augmentin [amoxicillin-pot Clavulanate] Hives, Other (See Comments)   Pt has taken Ceftriaxone in the past (??)  Latex Rash   Vaginal irritation with condoms        Medication List     STOP taking these medications    Accu-Chek Guide test strip Generic drug: glucose blood   Accu-Chek Guide w/Device Kit   Accu-Chek Softclix Lancets lancets   aspirin 81 MG chewable tablet   Blood Pressure Kit Devi   CitraNatal Harmony 27-1-260 MG Caps       TAKE these medications    acetaminophen 325 MG tablet Commonly known as: Tylenol Take 2 tablets (650 mg total) by mouth every 4 (four) hours as needed (for pain scale < 4).   docusate sodium 100 MG capsule Commonly known as: Colace Take 1 capsule (100 mg total) by mouth 2 (two) times daily.   famotidine 40 MG tablet Commonly known as: PEPCID Take 0.5 tablets (20 mg total) by mouth at bedtime.   furosemide 20 MG tablet Commonly known as: LASIX Take 1 tablet (20 mg total) by mouth daily for 3 days.    ibuprofen 600 MG tablet Commonly known as: ADVIL Take 1 tablet (600 mg total) by mouth every 6 (six) hours.   labetalol 200 MG tablet Commonly known as: NORMODYNE Take 1 tablet (200 mg total) by mouth 2 (two) times daily.   NIFEdipine 60 MG 24 hr tablet Commonly known as: ADALAT CC Take 1 tablet (60 mg total) by mouth 2 (two) times daily.   norethindrone 0.35 MG tablet Commonly known as: Ortho Micronor Take 1 tablet (0.35 mg total) by mouth daily.   pantoprazole 20 MG tablet Commonly known as: Protonix Take 1 tablet (20 mg total) by mouth daily.   polyethylene glycol powder 17 GM/SCOOP powder Commonly known as: GLYCOLAX/MIRALAX Take 17 g by mouth daily as needed.   Vitafol Ultra 29-0.6-0.4-200 MG Caps Take 1 capsule by mouth daily.         Discharge home in stable condition Infant Feeding: Breast Infant Disposition:home with mother Discharge instruction: per After Visit Summary and Postpartum booklet. Activity: Advance as tolerated. Pelvic rest for 6 weeks.  Diet: routine diet Future Appointments: Future Appointments  Date Time Provider Department Center  09/09/2022 10:40 AM [facility] NURSE [facility] None  10/15/2022  1:10 PM Warden Fillers, MD [facility] None   Follow up Visit:  Follow-up Information     [facility]. Go in 1 week(s).   Specialty: Obstetrics and Gynecology Why: Please follow up as scheduled Contact information: [address] [place] [address] [phone]                 Please schedule this patient for a Virtual postpartum visit in 6 weeks with the following provider: Any provider. Additional Postpartum F/U:BP check 1 week  Low risk pregnancy complicated by: GDM and preeclampsia Delivery mode:  Vaginal, Spontaneous  Anticipated Birth Control:  POPs  Message sent to [facility] @ 09/03/22 @ 8:23 AM   09/03/2022 Sharon Seller, DO

## 2022-09-01 NOTE — Lactation Note (Addendum)
This note was copied from a baby's chart. Lactation Consultation Note  Patient Name: Anita Allen Today's Date: 09/01/2022 Age:32 hours Reason for consult: Follow-up assessment  P4, Assisted mother with latching in football hold.  Helped with chin tug for baby to open wider.   Mother stated she also wants to pump using her hands free pump. She requested a manual pump.  Reviewed use. Explained that baby will get more volume at this time if she latches baby. Recommend offering second breast after baby finishes on first breast.  Feed on demand with cues.  Goal 8-12+ times per day after first 24 hrs.    Feeding Mother's Current Feeding Choice: Breast Milk Nipple Type:  (mother brought bottle from home)  LATCH Score Latch: Repeated attempts needed to sustain latch, nipple held in mouth throughout feeding, stimulation needed to elicit sucking reflex.  Audible Swallowing: A few with stimulation  Type of Nipple: Everted at rest and after stimulation  Comfort (Breast/Nipple): Soft / non-tender  Hold (Positioning): Assistance needed to correctly position infant at breast and maintain latch.  LATCH Score: 7   Lactation Tools Discussed/Used    Interventions Interventions: Breast feeding basics reviewed;Assisted with latch;Skin to skin;Breast compression;Adjust position;Support pillows  Discharge Pump: Personal;Hands Free  Consult Status Consult Status: Follow-up Date: 09/02/22 Follow-up type: In-patient    Dahlia Byes [facility] 09/01/2022, 1:11 PM

## 2022-09-01 NOTE — Plan of Care (Signed)
Problem: Education: Goal: Knowledge of General Education information will improve Description: Including pain rating scale, medication(s)/side effects and non-pharmacologic comfort measures 09/01/2022 0402 by Dalia Heading, RN Outcome: Completed/Met 08/31/2022 2313 by Dalia Heading, RN Outcome: Progressing   Problem: Health Behavior/Discharge Planning: Goal: Ability to manage health-related needs will improve 09/01/2022 0402 by Dalia Heading, RN Outcome: Completed/Met 08/31/2022 2313 by Dalia Heading, RN Outcome: Progressing   Problem: Clinical Measurements: Goal: Ability to maintain clinical measurements within normal limits will improve 09/01/2022 0402 by Dalia Heading, RN Outcome: Completed/Met 08/31/2022 2313 by Dalia Heading, RN Outcome: Progressing Goal: Will remain free from infection 09/01/2022 0402 by Dalia Heading, RN Outcome: Completed/Met 08/31/2022 2313 by Dalia Heading, RN Outcome: Progressing Goal: Diagnostic test results will improve 09/01/2022 0402 by Dalia Heading, RN Outcome: Completed/Met 08/31/2022 2313 by Dalia Heading, RN Outcome: Progressing Goal: Respiratory complications will improve 09/01/2022 0402 by Dalia Heading, RN Outcome: Completed/Met 08/31/2022 2313 by Dalia Heading, RN Outcome: Progressing Goal: Cardiovascular complication will be avoided 09/01/2022 0402 by Dalia Heading, RN Outcome: Completed/Met 08/31/2022 2313 by Dalia Heading, RN Outcome: Progressing   Problem: Activity: Goal: Risk for activity intolerance will decrease 09/01/2022 0402 by Dalia Heading, RN Outcome: Completed/Met 08/31/2022 2313 by Dalia Heading, RN Outcome: Progressing   Problem: Nutrition: Goal: Adequate nutrition will be maintained 09/01/2022 0402 by Dalia Heading, RN Outcome: Completed/Met 08/31/2022 2313 by Dalia Heading, RN Outcome: Progressing   Problem:  Coping: Goal: Level of anxiety will decrease 09/01/2022 0402 by Dalia Heading, RN Outcome: Completed/Met 08/31/2022 2313 by Dalia Heading, RN Outcome: Progressing   Problem: Elimination: Goal: Will not experience complications related to bowel motility 09/01/2022 0402 by Dalia Heading, RN Outcome: Completed/Met 08/31/2022 2313 by Dalia Heading, RN Outcome: Progressing Goal: Will not experience complications related to urinary retention 09/01/2022 0402 by Dalia Heading, RN Outcome: Completed/Met 08/31/2022 2313 by Dalia Heading, RN Outcome: Progressing   Problem: Pain Managment: Goal: General experience of comfort will improve 09/01/2022 0402 by Dalia Heading, RN Outcome: Completed/Met 08/31/2022 2313 by Dalia Heading, RN Outcome: Progressing   Problem: Safety: Goal: Ability to remain free from injury will improve 09/01/2022 0402 by Dalia Heading, RN Outcome: Completed/Met 08/31/2022 2313 by Dalia Heading, RN Outcome: Progressing   Problem: Skin Integrity: Goal: Risk for impaired skin integrity will decrease 09/01/2022 0402 by Dalia Heading, RN Outcome: Completed/Met 08/31/2022 2313 by Dalia Heading, RN Outcome: Progressing   Problem: Education: Goal: Knowledge of Childbirth will improve 09/01/2022 0402 by Dalia Heading, RN Outcome: Completed/Met 08/31/2022 2313 by Dalia Heading, RN Outcome: Progressing Goal: Ability to make informed decisions regarding treatment and plan of care will improve 09/01/2022 0402 by Dalia Heading, RN Outcome: Completed/Met 08/31/2022 2313 by Dalia Heading, RN Outcome: Progressing Goal: Ability to state and carry out methods to decrease the pain will improve 09/01/2022 0402 by Dalia Heading, RN Outcome: Completed/Met 08/31/2022 2313 by Dalia Heading, RN Outcome: Progressing Goal: Individualized Educational Video(s) 09/01/2022 0402 by Dalia Heading, RN Outcome: Completed/Met 08/31/2022 2313 by Dalia Heading, RN Outcome: Progressing   Problem: Coping: Goal: Ability to verbalize concerns and feelings about labor and delivery will improve 09/01/2022 0402 by Dalia Heading, RN Outcome: Completed/Met 08/31/2022 2313 by Dalia Heading, RN Outcome: Progressing   Problem: Life Cycle: Goal: Ability to make normal progression through stages of  labor will improve 09/01/2022 0402 by Dalia Heading, RN Outcome: Completed/Met 08/31/2022 2313 by Dalia Heading, RN Outcome: Progressing Goal: Ability to effectively push during vaginal delivery will improve 09/01/2022 0402 by Dalia Heading, RN Outcome: Completed/Met 08/31/2022 2313 by Dalia Heading, RN Outcome: Progressing   Problem: Role Relationship: Goal: Will demonstrate positive interactions with the child 09/01/2022 0402 by Dalia Heading, RN Outcome: Completed/Met 08/31/2022 2313 by Dalia Heading, RN Outcome: Progressing   Problem: Safety: Goal: Risk of complications during labor and delivery will decrease 09/01/2022 0402 by Dalia Heading, RN Outcome: Completed/Met 08/31/2022 2313 by Dalia Heading, RN Outcome: Progressing   Problem: Pain Management: Goal: Relief or control of pain from uterine contractions will improve 09/01/2022 0402 by Dalia Heading, RN Outcome: Completed/Met 08/31/2022 2313 by Dalia Heading, RN Outcome: Progressing   Problem: Education: Goal: Knowledge of disease or condition will improve 09/01/2022 0402 by Dalia Heading, RN Outcome: Completed/Met 08/31/2022 2313 by Dalia Heading, RN Outcome: Progressing Goal: Knowledge of the prescribed therapeutic regimen will improve 09/01/2022 0402 by Dalia Heading, RN Outcome: Completed/Met 08/31/2022 2313 by Dalia Heading, RN Outcome: Progressing   Problem: Fluid Volume: Goal: Peripheral tissue perfusion will  improve 09/01/2022 0402 by Dalia Heading, RN Outcome: Completed/Met 08/31/2022 2313 by Dalia Heading, RN Outcome: Progressing   Problem: Clinical Measurements: Goal: Complications related to disease process, condition or treatment will be avoided or minimized 09/01/2022 0402 by Dalia Heading, RN Outcome: Completed/Met 08/31/2022 2313 by Dalia Heading, RN Outcome: Progressing   Problem: Education: Goal: Knowledge of condition will improve Outcome: Completed/Met Goal: Individualized Educational Video(s) Outcome: Completed/Met Goal: Individualized Newborn Educational Video(s) Outcome: Completed/Met   Problem: Activity: Goal: Will verbalize the importance of balancing activity with adequate rest periods Outcome: Completed/Met Goal: Ability to tolerate increased activity will improve Outcome: Completed/Met   Problem: Coping: Goal: Ability to identify and utilize available resources and services will improve Outcome: Completed/Met   Problem: Life Cycle: Goal: Chance of risk for complications during the postpartum period will decrease Outcome: Completed/Met   Problem: Role Relationship: Goal: Ability to demonstrate positive interaction with newborn will improve Outcome: Completed/Met   Problem: Skin Integrity: Goal: Demonstration of wound healing without infection will improve Outcome: Completed/Met

## 2022-09-01 NOTE — Progress Notes (Signed)
Anita Allen is a 32 y.o. [phone] at [redacted]w[redacted]d by by LMP admitted for induction of labor due to Gestational diabetes with PreE dx'd by PCR of 0.4   Subjective: Patient reports being very nauseated, and experiencing pain on the right side. She is still uncomfortable s/p epidural   Objective: BP 128/80   Pulse 81   Temp 98.3 F (36.8 C) (Oral)   Resp 18   LMP 12/01/2021 (Approximate)   SpO2 100%  No intake/output data recorded. No intake/output data recorded.  FHT:  FHR: 135 bpm, variability: moderate,  accelerations:  Present,  decelerations:  Absent UC:   regular, every 2-3 minutes SVE:   Dilation: 4.5 Effacement (%): 80 Station: -2 Exam by:: Dorathy Daft CNM  Labs: Lab Results  Component Value Date   WBC 9.4 08/31/2022   HGB 11.8 (L) 08/31/2022   HCT 36.8 08/31/2022   MCV 83.6 08/31/2022   PLT 293 08/31/2022   Patient Vitals for the past 24 hrs:  BP Temp Temp src Pulse Resp SpO2  09/01/22 0030 128/80 -- -- 81 18 --  09/01/22 0000 133/72 -- -- 70 17 --  08/31/22 2345 139/77 -- -- 71 18 --  08/31/22 2340 (!) 146/83 -- -- 73 17 --  08/31/22 2335 (!) 156/89 -- -- 75 18 --  08/31/22 2330 (!) 157/90 -- -- 79 17 --  08/31/22 2300 (!) 130/101 -- -- 86 18 --  08/31/22 2248 (!) 159/94 98.3 F (36.8 C) Oral 76 17 --  08/31/22 2245 (!) 159/94 -- -- 76 18 --  08/31/22 2235 (!) 166/88 -- -- 87 18 --  08/31/22 2225 (!) 158/82 -- -- 84 17 --  08/31/22 2220 (!) 155/71 -- -- 71 18 --  08/31/22 2210 (!) 155/98 -- -- 71 17 --  08/31/22 2130 (!) 148/91 -- -- 78 18 --  08/31/22 2120 (!) 133/95 -- -- 87 17 --  08/31/22 2050 139/79 -- -- 79 18 --  08/31/22 2015 (!) 146/92 97.7 F (36.5 C) Axillary 71 18 --  08/31/22 1930 (!) 128/58 -- -- 81 17 --  08/31/22 1821 (!) 164/94 -- -- 94 -- --  08/31/22 1731 136/82 -- -- 82 -- --  08/31/22 1701 (!) 140/82 -- -- 93 -- --  08/31/22 1631 133/79 -- -- 83 -- --  08/31/22 1601 (!) 142/79 -- -- 85 -- --  08/31/22 1531 135/79 -- -- 94 -- --   08/31/22 1501 139/83 -- -- 94 -- --  08/31/22 1431 113/64 98.1 F (36.7 C) Oral 90 18 100 %  08/31/22 1404 (!) 115/58 -- -- 86 16 --  08/31/22 1330 125/73 -- -- 95 16 --  08/31/22 1325 132/69 -- -- 90 16 --  08/31/22 1258 (!) 145/77 -- -- 86 16 100 %  08/31/22 1120 (!) 149/92 98.1 F (36.7 C) Oral 80 18 100 %  08/31/22 1009 (!) 140/91 98.1 F (36.7 C) Oral 77 18 100 %  08/31/22 0907 (!) 149/88 97.7 F (36.5 C) Axillary 69 18 100 %  08/31/22 0805 (!) 148/96 98 F (36.7 C) Oral 83 18 100 %    Assessment / Plan: Induction of labor due to preeclampsia and gestational diabetes,  progressing well on pitocin  Labor:  Exam deferred until patient comfortable with epidural. Continue to titrate pit appropriately at  2x2 as needed Preeclampsia:  labs stable. BPs remain elevated No severe range BP. RN reports during epidural placement patient uncomfortable and Cuff not appropriately placed on arm  and patient moving about the bed and BPs not accurate. Continue to monitor for Severe range BPs. May initiate Mag if needed.  Fetal Wellbeing:  Category I Pain Control:  Epidural I/D:   GBS negative  Anticipated MOD:  NSVD  Claudette Head, CNM 09/01/2022, 1:38 AM

## 2022-09-01 NOTE — Anesthesia Postprocedure Evaluation (Signed)
Anesthesia Post Note  Patient: Anita Allen  Procedure(s) Performed: AN AD HOC LABOR EPIDURAL     Patient location during evaluation: Mother Baby Anesthesia Type: Epidural Level of consciousness: awake and alert Pain management: pain level controlled Vital Signs Assessment: post-procedure vital signs reviewed and stable Respiratory status: spontaneous breathing, nonlabored ventilation and respiratory function stable Cardiovascular status: stable Postop Assessment: no headache, no backache and epidural receding Anesthetic complications: no   No notable events documented.  Last Vitals:  Vitals:   09/01/22 1400 09/01/22 1500  BP:    Pulse:    Resp: 17 17  Temp:    SpO2:      Last Pain:  Vitals:   09/01/22 1255  TempSrc: Oral  PainSc:    Pain Goal: Patients Stated Pain Goal: 2 (09/01/22 1610)                 Fanny Dance

## 2022-09-02 LAB — CBC
HCT: 30.8 % — ABNORMAL LOW (ref 36.0–46.0)
Hemoglobin: 10.1 g/dL — ABNORMAL LOW (ref 12.0–15.0)
MCH: 27.5 pg (ref 26.0–34.0)
MCHC: 32.8 g/dL (ref 30.0–36.0)
MCV: 83.9 fL (ref 80.0–100.0)
Platelets: 269 10*3/uL (ref 150–400)
RBC: 3.67 MIL/uL — ABNORMAL LOW (ref 3.87–5.11)
RDW: 14.4 % (ref 11.5–15.5)
WBC: 10.8 10*3/uL — ABNORMAL HIGH (ref 4.0–10.5)
nRBC: 0 % (ref 0.0–0.2)

## 2022-09-02 MED ORDER — NIFEDIPINE ER OSMOTIC RELEASE 30 MG PO TB24
30.0000 mg | ORAL_TABLET | Freq: Once | ORAL | Status: AC
  Administered 2022-09-02: 30 mg via ORAL
  Filled 2022-09-02: qty 1

## 2022-09-02 MED ORDER — NIFEDIPINE ER OSMOTIC RELEASE 60 MG PO TB24: 60.0000 mg | ORAL_TABLET | Freq: Every day | ORAL | Status: DC

## 2022-09-02 MED ORDER — NIFEDIPINE ER OSMOTIC RELEASE 60 MG PO TB24
60.0000 mg | ORAL_TABLET | Freq: Two times a day (BID) | ORAL | Status: DC
  Administered 2022-09-02 – 2022-09-03 (×2): 60 mg via ORAL
  Filled 2022-09-02 (×2): qty 1

## 2022-09-02 NOTE — Lactation Note (Signed)
This note was copied from a baby's chart. Lactation Consultation Note  Patient Name: Anita Allen Today's Date: 09/02/2022 Age:32 hours Reason for consult: Follow-up assessment  P5, Mother is breastfeeding and occasionally formula feeding.  Bilirubin recheck this evening.  Encouraged mother to breastfeed on both breasts.  At this time mother prefers to use the manual pump instead of her hands free pump.  She states the hands free pump is uncomfortable.  She states the flanges seem to fit.  Suggest turning suction down and lubricating with coconut oil.  Feed on demand with cues.  Goal 8-12+ times per day after first 24 hrs.  Reviewed engorgement care and monitoring voids/stools.     Feeding Mother's Current Feeding Choice: Breast Milk and Formula  Interventions Interventions: Education  Discharge Discharge Education: Engorgement and breast care;Warning signs for feeding baby  Consult Status Consult Status: Complete Date: 09/02/22    Dahlia Byes [facility] 09/02/2022, 3:08 PM

## 2022-09-02 NOTE — Progress Notes (Signed)
POSTPARTUM PROGRESS NOTE  PPD #1  Subjective:  Anita Allen is a 32 y.o. [id number] s/p NSVD at [redacted]w[redacted]d. Today she notes that she has a mild headache, but otherwise doing well.  Rates pain 2/10, improved with medication. She denies any problems with ambulating, voiding or po intake. Denies nausea or vomiting. She has passed flatus, no BM.  Pain is well controlled.  Lochia minimal Denies fever/chills/chest pain/SOB.  no blurry vision, no RUQ pain  Objective: BP 122/70 (BP Location: Right Arm)   Pulse 88   Temp 98 F (36.7 C) (Oral)   Resp 18   LMP 12/01/2021 (Approximate)   SpO2 100%    Physical Exam:  General: alert, cooperative and no distress Chest: no respiratory distress Heart: regular rate and rhythm Abdomen: soft, nontender Uterine Fundus: firm, below umbilicus DVT Evaluation: No calf swelling or tenderness Extremities: no edema Skin: warm, dry   Assessment/Plan: Anita Allen is a 32 y.o. [id number] s/p NSVD at [redacted]w[redacted]d PPD#1 complicated by: 1) preeclampsia with severe features -s/p Mag x 24hrs -BP stable, will continue Lasix bid x 5 days and Procardia XL  daily  Continue routine postpartum care  Contraception: POPs Feeding: breast  Dispo: Continue postpartum care as outlined above.  Plan for discharge home tomorrow if BP remains stable.   LOS: 2 days   Myna Hidalgo, DO Faculty Attending, Center for [facility] 09/02/2022, 6:56 AM

## 2022-09-03 ENCOUNTER — Other Ambulatory Visit (HOSPITAL_COMMUNITY): Payer: Self-pay

## 2022-09-03 MED ORDER — LABETALOL HCL 200 MG PO TABS
200.0000 mg | ORAL_TABLET | Freq: Two times a day (BID) | ORAL | Status: DC
  Administered 2022-09-03: 200 mg via ORAL
  Filled 2022-09-03: qty 1

## 2022-09-03 MED ORDER — ACETAMINOPHEN 325 MG PO TABS: 650.0000 mg | ORAL_TABLET | ORAL | Status: DC | PRN

## 2022-09-03 MED ORDER — IBUPROFEN 600 MG PO TABS
600.0000 mg | ORAL_TABLET | Freq: Four times a day (QID) | ORAL | 0 refills | Status: DC
  Filled 2022-09-03: qty 30, 8d supply, fill #0

## 2022-09-03 MED ORDER — FUROSEMIDE 20 MG PO TABS
20.0000 mg | ORAL_TABLET | Freq: Every day | ORAL | 0 refills | Status: DC
  Filled 2022-09-03: qty 3, 3d supply, fill #0

## 2022-09-03 MED ORDER — NORETHINDRONE 0.35 MG PO TABS
1.0000 | ORAL_TABLET | Freq: Every day | ORAL | 4 refills | Status: DC
  Filled 2022-09-03: qty 28, 28d supply, fill #0

## 2022-09-03 MED ORDER — LABETALOL HCL 200 MG PO TABS
200.0000 mg | ORAL_TABLET | Freq: Two times a day (BID) | ORAL | 0 refills | Status: DC
  Filled 2022-09-03: qty 60, 30d supply, fill #0

## 2022-09-03 MED ORDER — NIFEDIPINE ER 60 MG PO TB24
60.0000 mg | ORAL_TABLET | Freq: Two times a day (BID) | ORAL | 0 refills | Status: DC
  Filled 2022-09-03: qty 60, 30d supply, fill #0

## 2022-09-03 NOTE — Lactation Note (Signed)
This note was copied from a baby's chart. Lactation Consultation Note  Patient Name: Anita Allen Today's Date: 09/03/2022 Age:32 hours  P4, GA [redacted]w[redacted]d, 7% weight loss  Mother states her milk is in. She is knowledgeable of engorgement management. Mother reports she is breastfeeding, pumping and supplementing with formula. Denies any questions or concerns. She is hopeful for discharge to home today.   Consult Status   Complete   Anita Allen 09/03/2022, 1:14 PM

## 2022-09-09 ENCOUNTER — Ambulatory Visit (INDEPENDENT_AMBULATORY_CARE_PROVIDER_SITE_OTHER): Payer: Medicaid Other | Admitting: Licensed Clinical Social Worker

## 2022-09-09 ENCOUNTER — Ambulatory Visit (INDEPENDENT_AMBULATORY_CARE_PROVIDER_SITE_OTHER): Payer: Medicaid Other | Admitting: *Deleted

## 2022-09-09 VITALS — BP 118/85 | HR 79 | Ht 61.0 in | Wt 210.0 lb

## 2022-09-09 DIAGNOSIS — F419 Anxiety disorder, unspecified: Secondary | ICD-10-CM

## 2022-09-09 DIAGNOSIS — F4321 Adjustment disorder with depressed mood: Secondary | ICD-10-CM | POA: Diagnosis not present

## 2022-09-09 DIAGNOSIS — Z419 Encounter for procedure for purposes other than remedying health state, unspecified: Secondary | ICD-10-CM | POA: Diagnosis not present

## 2022-09-09 DIAGNOSIS — R600 Localized edema: Secondary | ICD-10-CM

## 2022-09-09 MED ORDER — FUROSEMIDE 20 MG PO TABS
20.0000 mg | ORAL_TABLET | Freq: Two times a day (BID) | ORAL | 0 refills | Status: DC
Start: 2022-09-09 — End: 2023-01-15

## 2022-09-09 NOTE — Progress Notes (Signed)
Subjective:  Anita Allen is a 32 y.o. female here for BP check.   Hypertension ROS: taking medications as instructed, no medication side effects noted, no TIA's, no chest pain on exertion, no dyspnea on exertion, and noting swelling of ankles.    Objective:  LMP 12/01/2021 (Approximate)   Appearance alert, well appearing, and in no distress, oriented to person, place, and time, and overweight. General exam BP noted to be well controlled today in office.    Assessment:   Blood Pressure well controlled.   Plan:  Orders and follow up as documented in patient record..  Discussed with Dr. Jolayne Panther. Pt to stop Labetalol and continue Procardia only for BP. RX Lasix 5 days for 2+ pitting edema BLE.   Pt reporting postpartum anxiety. Referral and warm hand off to [employer].   Pt reporting decreased appetite and "haven't really eaten anything in 3 days." Advised to try protein shakes and continue to drink plenty of water to aid breastfeeding. Advised to schedule appt with provider if unable to eat for 1 week total. Advised to seek care sooner for acute symptoms.

## 2022-09-11 ENCOUNTER — Other Ambulatory Visit: Payer: Self-pay | Admitting: Obstetrics and Gynecology

## 2022-09-11 DIAGNOSIS — R600 Localized edema: Secondary | ICD-10-CM

## 2022-09-13 ENCOUNTER — Other Ambulatory Visit: Payer: Self-pay | Admitting: Obstetrics and Gynecology

## 2022-09-13 DIAGNOSIS — R600 Localized edema: Secondary | ICD-10-CM

## 2022-09-14 NOTE — BH Specialist Note (Signed)
Integrated Behavioral Health Initial In-Person Visit  MRN: [id number] Name: Anita Allen  Number of Integrated Behavioral Health Clinician visits: 1 Session Start time:   11:22 am Session End time: 12:06pm Total time in minutes: 43 mins in person   Types of Service: Individual psychotherapy  Interpretor:No. Interpretor Name and Language: none   Warm Hand Off Completed.        Subjective: Anita Allen is a 32 y.o. female accompanied by n/a Patient was referred by Donnetta Simpers RN for postpartum depression. Patient reports the following symptoms/concerns: postpartum depression  Duration of problem: ; Severity of problem: mild  Objective: Mood: good and Affect: Appropriate Risk of harm to self or others: No plan to harm self or others  Life Context: Family and Social: lives with children  School/Work: n/a Self-Care: n/a Life Changes: Separated with FOB   Patient and/or Family's Strengths/Protective Factors: Concrete supports in place (healthy food, safe environments, etc.)  Goals Addressed: Patient will: Reduce symptoms of: postpartum depression  Increase knowledge and/or ability of: coping skills  Demonstrate ability to: Increase healthy adjustment to current life circumstances  Progress towards Goals: Ongoing  Interventions: Interventions utilized: Supportive Counseling  Standardized Assessments completed: PHQ 9  Patient and/or Family Response: [name] is tearful and reports depressed mood since delivery due to discovering father of baby infidelity.    Assessment: Patient currently experiencing adjustment disorder with depressed mood.   Patient may benefit from integrated behavioral health.  Plan: Follow up with behavioral health clinician on : as needed  Behavioral recommendations: avoid conflict, self care, prioritize rest  Referral(s): Integrated [employer] (In Clinic) "From scale of 1-10, how likely are you to follow plan?":     Gwyndolyn Saxon, LCSW

## 2022-10-10 DIAGNOSIS — Z419 Encounter for procedure for purposes other than remedying health state, unspecified: Secondary | ICD-10-CM | POA: Diagnosis not present

## 2022-10-15 ENCOUNTER — Ambulatory Visit: Payer: Self-pay | Admitting: Obstetrics and Gynecology

## 2022-10-20 ENCOUNTER — Encounter: Payer: Self-pay | Admitting: Obstetrics

## 2022-10-20 ENCOUNTER — Ambulatory Visit (INDEPENDENT_AMBULATORY_CARE_PROVIDER_SITE_OTHER): Payer: Medicaid Other | Admitting: Obstetrics

## 2022-10-20 DIAGNOSIS — L709 Acne, unspecified: Secondary | ICD-10-CM | POA: Diagnosis not present

## 2022-10-20 DIAGNOSIS — Z3041 Encounter for surveillance of contraceptive pills: Secondary | ICD-10-CM | POA: Diagnosis not present

## 2022-10-20 MED ORDER — NORGESTIMATE-ETH ESTRADIOL 0.25-35 MG-MCG PO TABS
1.0000 | ORAL_TABLET | Freq: Every day | ORAL | 11 refills | Status: DC
Start: 2022-10-20 — End: 2023-01-15

## 2022-10-20 MED ORDER — VITAFOL ULTRA 29-0.6-0.4-200 MG PO CAPS
1.0000 | ORAL_CAPSULE | Freq: Every day | ORAL | 11 refills | Status: DC
Start: 2022-10-20 — End: 2023-01-15

## 2022-10-20 NOTE — Progress Notes (Signed)
Post Partum Visit Note  Anita Allen is a 32 y.o. [phone] female who presents for a postpartum visit. She is 7 weeks postpartum following a normal spontaneous vaginal delivery.  I have fully reviewed the prenatal and intrapartum course. The delivery was at 39.1 gestational weeks.  Anesthesia: epidural. Postpartum course has been unremarkable. Baby is doing well. Baby is feeding by bottle - Enfamil Prosobee. Bleeding thick, heavy lochia. Bowel function is constipated/normal. Bladder function is normal. Patient is sexually active. Contraception method is OCP (estrogen/progesterone). Postpartum depression screening: negative.   Upstream - 10/20/22 1122       Pregnancy Intention Screening   Does the patient want to become pregnant in the next year? No    Does the patient's partner want to become pregnant in the next year? No    Would the patient like to discuss contraceptive options today? Yes      Contraception Wrap Up   Current Method Oral Contraceptive            The pregnancy intention screening data noted above was reviewed. Potential methods of contraception were discussed. The patient elected to proceed with No data recorded.   Edinburgh Postnatal Depression Scale - 10/20/22 1121       Edinburgh Postnatal Depression Scale:  In the Past 7 Days   I have been able to laugh and see the funny side of things. 0    I have looked forward with enjoyment to things. 0    I have blamed myself unnecessarily when things went wrong. 0    I have been anxious or worried for no good reason. 0    I have felt scared or panicky for no good reason. 0    Things have been getting on top of me. 0    I have been so unhappy that I have had difficulty sleeping. 2    I have felt sad or miserable. 0    I have been so unhappy that I have been crying. 1    The thought of harming myself has occurred to me. 0    Edinburgh Postnatal Depression Scale Total 3             Health Maintenance Due   Topic Date Due   HPV VACCINES (2 - 3-dose SCDM series) 09/10/2021   COVID-19 Vaccine (2 - 2023-24 season) 01/09/2022    The following portions of the patient's history were reviewed and updated as appropriate: allergies, current medications, past family history, past medical history, past social history, past surgical history, and problem list.  Review of Systems A comprehensive review of systems was negative.  Objective:  BP 126/84   Pulse 67   Ht 5\' 1"  (1.549 m)   Wt 194 lb (88 kg)   LMP 12/01/2021 (Approximate)   Breastfeeding No   BMI 36.66 kg/m    General:  alert and no distress   Breasts:  normal  Lungs: clear to auscultation bilaterally  Heart:  regular rate and rhythm, S1, S2 normal, no murmur, click, rub or gallop  Abdomen: soft, non-tender; bowel sounds normal; no masses,  no organomegaly   Wound none  GU exam:  not indicated       Assessment:    1. Postpartum care following vaginal delivery Rx: - Prenat-Fe Poly-Methfol-FA-DHA (VITAFOL ULTRA) 29-0.6-0.4-200 MG CAPS; Take 1 capsule by mouth daily.  Dispense: 30 capsule; Refill: 11  2. Encounter for surveillance of contraceptive pills Rx: - norgestimate-ethinyl estradiol (ORTHO-CYCLEN) 0.25-35 MG-MCG  tablet; Take 1 tablet by mouth daily.  Dispense: 28 tablet; Refill: 11  3. Acne, unspecified acne type Rx - Ambulatory referral to Dermatology    Plan:   Essential components of care per ACOG recommendations:  1.  Mood and well being: Patient with negative depression screening today. Reviewed local resources for support.  - Patient tobacco use? No.   - hx of drug use? No.    2. Infant care and feeding:  -Patient currently breastmilk feeding? No.  -Social determinants of health (SDOH) reviewed in EPIC. No concerns  3. Sexuality, contraception and birth spacing - Patient does not want a pregnancy in the next year.  Desired family size is 4 children.  - Reviewed reproductive life planning. Reviewed  contraceptive methods based on pt preferences and effectiveness.  Patient desired Oral Contraceptive today.   - Discussed birth spacing of 18 months  4. Sleep and fatigue -Encouraged family/partner/community support of 4 hrs of uninterrupted sleep to help with mood and fatigue  5. Physical Recovery  - Discussed patients delivery and complications. She describes her labor as good. - Patient had a Vaginal, no problems at delivery. Patient had no laceration. Perineal healing reviewed. Patient expressed understanding - Patient has urinary incontinence? No. - Patient is safe to resume physical and sexual activity  6.  Health Maintenance - HM due items addressed Yes - Last pap smear  Diagnosis  Date Value Ref Range Status  06/13/2021 - Low grade squamous intraepithelial lesion (LSIL) (A)  Final   Pap smear not done at today's visit.  -Breast Cancer screening indicated? No.   7. Chronic Disease/Pregnancy Condition follow up: None   Charles A. Clearance Coots MD Center for [facility], [facility], [facility] 10/20/22

## 2022-10-29 ENCOUNTER — Telehealth: Payer: Medicaid Other | Admitting: Physician Assistant

## 2022-10-29 DIAGNOSIS — B9689 Other specified bacterial agents as the cause of diseases classified elsewhere: Secondary | ICD-10-CM

## 2022-10-29 DIAGNOSIS — N760 Acute vaginitis: Secondary | ICD-10-CM | POA: Diagnosis not present

## 2022-10-29 MED ORDER — METRONIDAZOLE 500 MG PO TABS
500.0000 mg | ORAL_TABLET | Freq: Two times a day (BID) | ORAL | 0 refills | Status: AC
Start: 2022-10-29 — End: 2022-11-05

## 2022-10-29 NOTE — Progress Notes (Signed)
E-Visit for Vaginal Symptoms  We are sorry that you are not feeling well. Here is how we plan to help! Based on what you shared with me it looks like you: May have a vaginosis due to bacteria  Vaginosis is an inflammation of the vagina that can result in discharge, itching and pain. The cause is usually a change in the normal balance of vaginal bacteria or an infection. Vaginosis can also result from reduced estrogen levels after menopause.  The most common causes of vaginosis are:   Bacterial vaginosis which results from an overgrowth of one on several organisms that are normally present in your vagina.   Yeast infections which are caused by a naturally occurring fungus called candida.   Vaginal atrophy (atrophic vaginosis) which results from the thinning of the vagina from reduced estrogen levels after menopause.   Trichomoniasis which is caused by a parasite and is commonly transmitted by sexual intercourse.  Factors that increase your risk of developing vaginosis include: Medications, such as antibiotics and steroids Uncontrolled diabetes Use of hygiene products such as bubble bath, vaginal spray or vaginal deodorant Douching Wearing damp or tight-fitting clothing Using an intrauterine device (IUD) for birth control Hormonal changes, such as those associated with pregnancy, birth control pills or menopause Sexual activity Having a sexually transmitted infection  Your treatment plan is Metronidazole or Flagyl 500mg twice a day for 7 days.  I have electronically sent this prescription into the pharmacy that you have chosen.  Be sure to take all of the medication as directed. Stop taking any medication if you develop a rash, tongue swelling or shortness of breath. Mothers who are breast feeding should consider pumping and discarding their breast milk while on these antibiotics. However, there is no consensus that infant exposure at these doses would be harmful.  Remember that  medication creams can weaken latex condoms. .   HOME CARE:  Good hygiene may prevent some types of vaginosis from recurring and may relieve some symptoms:  Avoid baths, hot tubs and whirlpool spas. Rinse soap from your outer genital area after a shower, and dry the area well to prevent irritation. Don't use scented or harsh soaps, such as those with deodorant or antibacterial action. Avoid irritants. These include scented tampons and pads. Wipe from front to back after using the toilet. Doing so avoids spreading fecal bacteria to your vagina.  Other things that may help prevent vaginosis include:  Don't douche. Your vagina doesn't require cleansing other than normal bathing. Repetitive douching disrupts the normal organisms that reside in the vagina and can actually increase your risk of vaginal infection. Douching won't clear up a vaginal infection. Use a latex condom. Both female and female latex condoms may help you avoid infections spread by sexual contact. Wear cotton underwear. Also wear pantyhose with a cotton crotch. If you feel comfortable without it, skip wearing underwear to bed. Yeast thrives in moist environments Your symptoms should improve in the next day or two.  GET HELP RIGHT AWAY IF:  You have pain in your lower abdomen ( pelvic area or over your ovaries) You develop nausea or vomiting You develop a fever Your discharge changes or worsens You have persistent pain with intercourse You develop shortness of breath, a rapid pulse, or you faint.  These symptoms could be signs of problems or infections that need to be evaluated by a medical provider now.  MAKE SURE YOU   Understand these instructions. Will watch your condition. Will get help right   away if you are not doing well or get worse.  Thank you for choosing an e-visit.  Your e-visit answers were reviewed by a board certified advanced clinical practitioner to complete your personal care plan. Depending upon the  condition, your plan could have included both over the counter or prescription medications.  Please review your pharmacy choice. Make sure the pharmacy is open so you can pick up prescription now. If there is a problem, you may contact your provider through MyChart messaging and have the prescription routed to another pharmacy.  Your safety is important to us. If you have drug allergies check your prescription carefully.   For the next 24 hours you can use MyChart to ask questions about today's visit, request a non-urgent call back, or ask for a work or school excuse. You will get an email in the next two days asking about your experience. I hope that your e-visit has been valuable and will speed your recovery.  I have spent 5 minutes in review of e-visit questionnaire, review and updating patient chart, medical decision making and response to patient.   Tracye Szuch M Keano Guggenheim, PA-C  

## 2022-11-09 DIAGNOSIS — Z419 Encounter for procedure for purposes other than remedying health state, unspecified: Secondary | ICD-10-CM | POA: Diagnosis not present

## 2022-11-18 ENCOUNTER — Ambulatory Visit: Payer: Medicaid Other | Admitting: Obstetrics

## 2022-12-10 DIAGNOSIS — Z419 Encounter for procedure for purposes other than remedying health state, unspecified: Secondary | ICD-10-CM | POA: Diagnosis not present

## 2022-12-23 ENCOUNTER — Telehealth: Payer: 59 | Admitting: Physician Assistant

## 2022-12-23 DIAGNOSIS — H00012 Hordeolum externum right lower eyelid: Secondary | ICD-10-CM

## 2022-12-23 NOTE — Progress Notes (Signed)
  E-Visit for Stye   We are sorry that you are not feeling well. Here is how we plan to help!  Based on what you have shared with me it looks like you have a stye.  A stye is an inflammation of the eyelid.  It is often a red, painful lump near the edge of the eyelid that may look like a boil or a pimple.  A stye develops when an infection occurs at the base of an eyelash.   We have made appropriate suggestions for you based upon your presentation: Simple styes can be treated without medical intervention.  Most styes either resolve spontaneously or resolve with simple home treatment by applying warm compresses or heated washcloth to the stye for about 10-15 minutes three to four times a day. This causes the stye to drain and resolve.  On review of your submission, you have either listed that you are in a state outside our coverage area or have listed a pharmacy outside that area. Our providers are licensed in [place] and [place], and cannot send a medication to a patient outside that coverage area due to telehealth laws.   In order to make sure you are taken care of today, there are a few options:  (1) Contact your PCP to see if they can see you via video and send the medication in for you (2) You can click HERE to be taken to our [facility]yt[facility] site to be paired with a provider licensed in your area. When signing up for a visit, you need to select the state you are currently in so you will be able to select the proper providers (3) Be seen locally at an urgent care facility.   Thank you again for choosing [facility] for your care needs. We hope you feel better soon!   HOME CARE:  Wash your hands often! Let the stye open on its own. Don't squeeze or open it. Don't rub your eyes. This can irritate your eyes and let in bacteria.  If you need to touch your eyes, wash your hands first. Don't wear eye makeup or contact lenses until the area has healed.  GET HELP RIGHT AWAY IF:  Your symptoms  do not improve. You develop blurred or loss of vision. Your symptoms worsen (increased discharge, pain or redness).   Thank you for choosing an e-visit.  Your e-visit answers were reviewed by a board certified advanced clinical practitioner to complete your personal care plan. Depending upon the condition, your plan could have included both over the counter or prescription medications.  Please review your pharmacy choice. Make sure the pharmacy is open so you can pick up prescription now. If there is a problem, you may contact your provider through [employer] and have the prescription routed to another pharmacy.  Your safety is important to [place]. If you have drug allergies check your prescription carefully.   For the next 24 hours you can use MyChart to ask questions about today's visit, request a non-urgent call back, or ask for a work or school excuse. You will get an email in the next two days asking about your experience. I hope that your e-visit has been valuable and will speed your recovery.

## 2022-12-23 NOTE — Progress Notes (Signed)
I have spent 5 minutes in review of e-visit questionnaire, review and updating patient chart, medical decision making and response to patient.   William Cody Martin, PA-C    

## 2023-01-10 DIAGNOSIS — Z419 Encounter for procedure for purposes other than remedying health state, unspecified: Secondary | ICD-10-CM | POA: Diagnosis not present

## 2023-01-15 ENCOUNTER — Ambulatory Visit (INDEPENDENT_AMBULATORY_CARE_PROVIDER_SITE_OTHER): Payer: 59 | Admitting: Nurse Practitioner

## 2023-01-15 ENCOUNTER — Telehealth: Payer: Self-pay

## 2023-01-15 ENCOUNTER — Telehealth: Payer: Self-pay | Admitting: Nurse Practitioner

## 2023-01-15 ENCOUNTER — Encounter: Payer: Self-pay | Admitting: Nurse Practitioner

## 2023-01-15 ENCOUNTER — Other Ambulatory Visit (HOSPITAL_COMMUNITY): Payer: Self-pay

## 2023-01-15 VITALS — BP 106/74 | HR 86 | Temp 98.2°F | Ht 61.0 in | Wt 187.0 lb

## 2023-01-15 DIAGNOSIS — Z6835 Body mass index (BMI) 35.0-35.9, adult: Secondary | ICD-10-CM

## 2023-01-15 DIAGNOSIS — E669 Obesity, unspecified: Secondary | ICD-10-CM | POA: Diagnosis not present

## 2023-01-15 DIAGNOSIS — E66812 Obesity, class 2: Secondary | ICD-10-CM | POA: Insufficient documentation

## 2023-01-15 LAB — COMPREHENSIVE METABOLIC PANEL
ALT: 16 U/L (ref 0–35)
AST: 16 U/L (ref 0–37)
Albumin: 4 g/dL (ref 3.5–5.2)
Alkaline Phosphatase: 139 U/L — ABNORMAL HIGH (ref 39–117)
BUN: 8 mg/dL (ref 6–23)
CO2: 26 meq/L (ref 19–32)
Calcium: 10.5 mg/dL (ref 8.4–10.5)
Chloride: 106 meq/L (ref 96–112)
Creatinine, Ser: 0.76 mg/dL (ref 0.40–1.20)
GFR: 103.65 mL/min (ref >60.00)
Glucose, Bld: 92 mg/dL (ref 70–99)
Potassium: 4.3 meq/L (ref 3.5–5.1)
Sodium: 138 meq/L (ref 135–145)
Total Bilirubin: 0.7 mg/dL (ref 0.2–1.2)
Total Protein: 7.7 g/dL (ref 6.0–8.3)

## 2023-01-15 LAB — CBC
HCT: 43.4 % (ref 36.0–46.0)
Hemoglobin: 13.7 g/dL (ref 12.0–15.0)
MCHC: 31.6 g/dL (ref 30.0–36.0)
MCV: 82.1 fl (ref 78.0–100.0)
Platelets: 399 10*3/uL (ref 150.0–400.0)
RBC: 5.29 Mil/uL — ABNORMAL HIGH (ref 3.87–5.11)
RDW: 15.2 % (ref 11.5–15.5)
WBC: 8.6 10*3/uL (ref 4.0–10.5)

## 2023-01-15 LAB — TSH: TSH: 0.58 u[IU]/mL (ref 0.35–5.50)

## 2023-01-15 LAB — POCT URINE PREGNANCY: Preg Test, Ur: NEGATIVE

## 2023-01-15 LAB — LIPID PANEL
Cholesterol: 174 mg/dL (ref 0–200)
HDL: 55.5 mg/dL (ref >39.00)
LDL Cholesterol: 107 mg/dL — ABNORMAL HIGH (ref 0–99)
NonHDL: 118.27
Total CHOL/HDL Ratio: 3
Triglycerides: 58 mg/dL (ref 0.0–149.0)
VLDL: 11.6 mg/dL (ref 0.0–40.0)

## 2023-01-15 LAB — HEMOGLOBIN A1C: Hgb A1c MFr Bld: 5.4 % (ref 4.6–6.5)

## 2023-01-15 NOTE — Telephone Encounter (Signed)
Patient has spoke to [facility] and they will cover the medication but they need the denial letter and a new prior authorization sent to [facility]

## 2023-01-15 NOTE — Assessment & Plan Note (Signed)
Chronic Patient has tried and failed lifestyle modification as well as pharmacological treatment in the past. She would be a candidate based on her BMI for weight loss medication. NUUVOZ I think would be a good option for her, we discussed blackbox warning (thyroid cancer) as well as potential side effects and what to do if these were to occur (specifically to notify me if she has severe abdominal pain, nausea with vomiting, or notices any enlargement/swelling of her neck)  We discussed injection technique as well as injection frequency and how to dispose of needles appropriately. We discussed the need to prevent pregnancy while taking the medication and that oral contraceptive pills are less effective when taken with [facility].  Thus she was told she needs to use another contraceptive option such as barrier, IUD, vaginal ring, or Nexplanon implant.  She reports her understanding.  She was also told if she were to skip/miss a period once Reginal Lutes is initiated she should not take any more doses and needs to immediately call office to rule out pregnancy.  She reports her understanding We will move forward with prior authorization for [facility] if approved, will initiate her on 0.25 mg weekly injection. Patient to follow-up in 1 month or sooner as needed.

## 2023-01-15 NOTE — Progress Notes (Signed)
Established Patient Office Visit  Subjective   Patient ID: Anita Allen, female    DOB: July 07, 1990  Age: 32 y.o. MRN: [id number]  Chief Complaint  Patient presents with   Obesity   Patient arrives today to reestablish care.  She was seen in this office last about a year and a half ago.  She reports that she is 4 months postpartum, and did have gestational diabetes as well as preeclampsia towards the end of her pregnancy.  She would like to reestablish with primary care to focus on her health.  Her main concern is obesity. She is not breast-feeding, she uses oral contraceptive pills for contraception, not currently sexually active.  Obesity: Reports having struggled with maintaining healthy weight for 5 years.  She has tried and failed dietary changes, increase in activity level.  She is also tried phentermine in the past which did result in weight loss but she was unable to maintain it.  She is also been taking compounded semaglutide via an [employer].  She reports she initially lost some weight but weight has now plateaued.  She would like to discuss starting Wegovy.  She denies any personal or family history of thyroid cancer, no history of pancreatitis.     ROS: see HPI    Objective:     BP 106/74   Pulse 86   Temp 98.2 F (36.8 C) (Temporal)   Ht 5\' 1"  (1.549 m)   Wt 187 lb (84.8 kg)   LMP  (LMP Unknown)   SpO2 98%   BMI 35.33 kg/m    Physical Exam Vitals reviewed.  Constitutional:      General: She is not in acute distress.    Appearance: Normal appearance.  HENT:     Head: Normocephalic and atraumatic.  Neck:     Vascular: No carotid bruit.  Cardiovascular:     Rate and Rhythm: Normal rate and regular rhythm.     Pulses: Normal pulses.     Heart sounds: Normal heart sounds.  Pulmonary:     Effort: Pulmonary effort is normal.     Breath sounds: Normal breath sounds.  Skin:    General: Skin is warm and dry.  Neurological:     General: No focal deficit  present.     Mental Status: She is alert and oriented to person, place, and time.  Psychiatric:        Mood and Affect: Mood normal.        Behavior: Behavior normal.        Judgment: Judgment normal.      Results for orders placed or performed in visit on 01/15/23  POCT urine pregnancy  Result Value Ref Range   Preg Test, Ur Negative Negative      The ASCVD Risk score (Arnett DK, et al., 2019) failed to calculate for the following reasons:   The 2019 ASCVD risk score is only valid for ages 53 to 59    Assessment & Plan:   Problem List Items Addressed This Visit       Other   Class 2 obesity without serious comorbidity with body mass index (BMI) of 35.0 to 35.9 in adult - Primary    Chronic Patient has tried and failed lifestyle modification as well as pharmacological treatment in the past. She would be a candidate based on her BMI for weight loss medication. WUJWJX I think would be a good option for her, we discussed blackbox warning (thyroid cancer) as well as  potential side effects and what to do if these were to occur (specifically to notify me if she has severe abdominal pain, nausea with vomiting, or notices any enlargement/swelling of her neck)  We discussed injection technique as well as injection frequency and how to dispose of needles appropriately. We discussed the need to prevent pregnancy while taking the medication and that oral contraceptive pills are less effective when taken with [facility].  Thus she was told she needs to use another contraceptive option such as barrier, IUD, vaginal ring, or Nexplanon implant.  She reports her understanding.  She was also told if she were to skip/miss a period once Reginal Lutes is initiated she should not take any more doses and needs to immediately call office to rule out pregnancy.  She reports her understanding We will move forward with prior authorization for [facility] if approved, will initiate her on 0.25 mg weekly injection. Patient  to follow-up in 1 month or sooner as needed.      Relevant Medications   phentermine (ADIPEX-P) 37.5 MG tablet   Other Relevant Orders   CBC   Comprehensive metabolic panel   Hemoglobin A1c   Lipid panel   TSH   POCT urine pregnancy (Completed)    Return in about 1 month (around 02/14/2023) for F/U with Mirjana Tarleton.    Elenore Paddy, NP

## 2023-01-15 NOTE — Telephone Encounter (Signed)
Pharmacy Patient Advocate Encounter   Received notification from Pt Calls Messages that prior authorization for Wegovy 0.25mg /0.56ml is required/requested.   Insurance verification completed.   The patient is insured through [facility] .   Per test claim: PA required; PA submitted to [facility] via CoverMyMeds Key/confirmation #/EOC [id number] Status is pending

## 2023-01-15 NOTE — Telephone Encounter (Signed)
Please start prior authorization for [facility] for weight loss. Please ensure that both insurances are utilized to check for coverage. She has [place] medicaid and she has [facility]

## 2023-01-18 ENCOUNTER — Other Ambulatory Visit (HOSPITAL_COMMUNITY): Payer: Self-pay

## 2023-01-18 NOTE — Telephone Encounter (Signed)
Pharmacy Patient Advocate Encounter  Received notification from [facility] that Prior Authorization for [facility] 0.25mg / has been DENIED.  The medication is not covered by your plan.    PA #/Case ID/Reference #: [id number]

## 2023-01-18 NOTE — Telephone Encounter (Signed)
Pharmacy Patient Advocate Encounter   Received notification from Pt Calls Messages that prior authorization for Wegovy 0.25mg /0.73ml is required/requested.   Insurance verification completed.   The patient is insured through [facility] .   Per test claim: PA required; PA submitted to [facility] via CoverMyMeds Key/confirmation #/EOC  [id number] Status is pending

## 2023-01-18 NOTE — Telephone Encounter (Signed)
Patient received a prior authorization denial for [facility] through her first insurance. Her second insurance, Medicaid, needs a new prior authorization done for them with the denial attached so that it shows we tried their first insurance first. Best callback is [phone].

## 2023-01-19 ENCOUNTER — Telehealth: Payer: 59 | Admitting: Physician Assistant

## 2023-01-19 ENCOUNTER — Encounter: Payer: Self-pay | Admitting: Radiology

## 2023-01-19 ENCOUNTER — Other Ambulatory Visit (HOSPITAL_COMMUNITY): Payer: Self-pay

## 2023-01-19 DIAGNOSIS — B3731 Acute candidiasis of vulva and vagina: Secondary | ICD-10-CM | POA: Diagnosis not present

## 2023-01-19 MED ORDER — FLUCONAZOLE 150 MG PO TABS
150.0000 mg | ORAL_TABLET | Freq: Once | ORAL | 0 refills | Status: AC
Start: 2023-01-19 — End: 2023-01-19

## 2023-01-19 NOTE — Progress Notes (Signed)

## 2023-01-19 NOTE — Progress Notes (Signed)
I have spent 5 minutes in review of e-visit questionnaire, review and updating patient chart, medical decision making and response to patient.   William Cody Martin, PA-C    

## 2023-01-19 NOTE — Telephone Encounter (Signed)
Pharmacy Patient Advocate Encounter  Received notification from  [facility]  that Prior Authorization for Wegovy 0.25mg /0.90ml has been APPROVED from 01/18/23 to 07/17/23   PA #/Case ID/Reference #: [id number]  Approval letter indexed to media tab

## 2023-01-19 NOTE — Telephone Encounter (Signed)
Please call patient and let her know when this rx has been sent to the pharmacy - She needs to know when she can pick up the prescription

## 2023-01-20 NOTE — Telephone Encounter (Signed)
Pt has called and stated rx for Wegovy to [facility] @ [address] as her PA has been approved.

## 2023-01-20 NOTE — Telephone Encounter (Signed)
Patient called back and would like for the nurse to call her - [phone]

## 2023-01-21 ENCOUNTER — Other Ambulatory Visit (HOSPITAL_COMMUNITY): Payer: Self-pay

## 2023-01-21 ENCOUNTER — Other Ambulatory Visit: Payer: Self-pay | Admitting: Nurse Practitioner

## 2023-01-21 DIAGNOSIS — E66812 Obesity, class 2: Secondary | ICD-10-CM

## 2023-01-21 DIAGNOSIS — E669 Obesity, unspecified: Secondary | ICD-10-CM

## 2023-01-21 MED ORDER — WEGOVY 0.25 MG/0.5ML ~~LOC~~ SOAJ
0.2500 mg | SUBCUTANEOUS | 1 refills | Status: DC
Start: 2023-01-21 — End: 2023-02-26
  Filled 2023-01-21: qty 2, 28d supply, fill #0

## 2023-01-22 ENCOUNTER — Telehealth: Payer: 59 | Admitting: Nurse Practitioner

## 2023-01-22 DIAGNOSIS — N898 Other specified noninflammatory disorders of vagina: Secondary | ICD-10-CM | POA: Diagnosis not present

## 2023-01-22 MED ORDER — METRONIDAZOLE 0.75 % VA GEL
1.0000 | Freq: Every day | VAGINAL | 0 refills | Status: AC
Start: 2023-01-22 — End: 2023-01-29

## 2023-01-22 NOTE — Progress Notes (Signed)
E-Visit for Vaginal Symptoms  We are sorry that you are not feeling well. Here is how we plan to help! Based on what you shared with me it looks like you: May have a vaginosis due to bacteria  Vaginosis is an inflammation of the vagina that can result in discharge, itching and pain. The cause is usually a change in the normal balance of vaginal bacteria or an infection. Vaginosis can also result from reduced estrogen levels after menopause.  The most common causes of vaginosis are:   Bacterial vaginosis which results from an overgrowth of one on several organisms that are normally present in your vagina.   Yeast infections which are caused by a naturally occurring fungus called candida.   Vaginal atrophy (atrophic vaginosis) which results from the thinning of the vagina from reduced estrogen levels after menopause.   Trichomoniasis which is caused by a parasite and is commonly transmitted by sexual intercourse.  Factors that increase your risk of developing vaginosis include: Medications, such as antibiotics and steroids Uncontrolled diabetes Use of hygiene products such as bubble bath, vaginal spray or vaginal deodorant Douching Wearing damp or tight-fitting clothing Using an intrauterine device (IUD) for birth control Hormonal changes, such as those associated with pregnancy, birth control pills or menopause Sexual activity Having a sexually transmitted infection  Because you have recently had a yeast infection, I recommend using the gel to prevent another rebound yeast infection from this antibiotic  Your treatment plan is  Meds ordered this encounter  Medications   metroNIDAZOLE (METROGEL) 0.75 % vaginal gel    Sig: Place 1 Applicatorful vaginally at bedtime for 7 days.    Dispense:  70 g    Refill:  0     Be sure to take all of the medication as directed. Stop taking any medication if you develop a rash, tongue swelling or shortness of breath. Mothers who are breast  feeding should consider pumping and discarding their breast milk while on these antibiotics. However, there is no consensus that infant exposure at these doses would be harmful.  Remember that medication creams can weaken latex condoms. Marland Kitchen   HOME CARE:  Good hygiene may prevent some types of vaginosis from recurring and may relieve some symptoms:  Avoid baths, hot tubs and whirlpool spas. Rinse soap from your outer genital area after a shower, and dry the area well to prevent irritation. Don't use scented or harsh soaps, such as those with deodorant or antibacterial action. Avoid irritants. These include scented tampons and pads. Wipe from front to back after using the toilet. Doing so avoids spreading fecal bacteria to your vagina.  Other things that may help prevent vaginosis include:  Don't douche. Your vagina doesn't require cleansing other than normal bathing. Repetitive douching disrupts the normal organisms that reside in the vagina and can actually increase your risk of vaginal infection. Douching won't clear up a vaginal infection. Use a latex condom. Both female and female latex condoms may help you avoid infections spread by sexual contact. Wear cotton underwear. Also wear pantyhose with a cotton crotch. If you feel comfortable without it, skip wearing underwear to bed. Yeast thrives in [employer] Your symptoms should improve in the next day or two.  GET HELP RIGHT AWAY IF:  You have pain in your lower abdomen ( pelvic area or over your ovaries) You develop nausea or vomiting You develop a fever Your discharge changes or worsens You have persistent pain with intercourse You develop shortness of breath,  a rapid pulse, or you faint.  These symptoms could be signs of problems or infections that need to be evaluated by a medical provider now.  MAKE SURE YOU   Understand these instructions. Will watch your condition. Will get help right away if you are not doing well or  get worse.  Thank you for choosing an e-visit.  Your e-visit answers were reviewed by a board certified advanced clinical practitioner to complete your personal care plan. Depending upon the condition, your plan could have included both over the counter or prescription medications.  Please review your pharmacy choice. Make sure the pharmacy is open so you can pick up prescription now. If there is a problem, you may contact your provider through [employer] and have the prescription routed to another pharmacy.  Your safety is important to [place]. If you have drug allergies check your prescription carefully.   For the next 24 hours you can use MyChart to ask questions about today's visit, request a non-urgent call back, or ask for a work or school excuse. You will get an email in the next two days asking about your experience. I hope that your e-visit has been valuable and will speed your recovery.   I spent approximately 5 minutes reviewing the patient's history, current symptoms and coordinating their care today.

## 2023-01-26 DIAGNOSIS — M542 Cervicalgia: Secondary | ICD-10-CM | POA: Diagnosis not present

## 2023-01-26 DIAGNOSIS — Z3200 Encounter for pregnancy test, result unknown: Secondary | ICD-10-CM | POA: Diagnosis not present

## 2023-01-26 DIAGNOSIS — Z1159 Encounter for screening for other viral diseases: Secondary | ICD-10-CM | POA: Diagnosis not present

## 2023-01-26 DIAGNOSIS — M129 Arthropathy, unspecified: Secondary | ICD-10-CM | POA: Diagnosis not present

## 2023-01-26 DIAGNOSIS — E7800 Pure hypercholesterolemia, unspecified: Secondary | ICD-10-CM | POA: Diagnosis not present

## 2023-01-26 DIAGNOSIS — M545 Low back pain, unspecified: Secondary | ICD-10-CM | POA: Diagnosis not present

## 2023-01-26 DIAGNOSIS — M546 Pain in thoracic spine: Secondary | ICD-10-CM | POA: Diagnosis not present

## 2023-01-26 DIAGNOSIS — Z79899 Other long term (current) drug therapy: Secondary | ICD-10-CM | POA: Diagnosis not present

## 2023-01-26 DIAGNOSIS — Z6835 Body mass index (BMI) 35.0-35.9, adult: Secondary | ICD-10-CM | POA: Diagnosis not present

## 2023-01-26 DIAGNOSIS — R5383 Other fatigue: Secondary | ICD-10-CM | POA: Diagnosis not present

## 2023-01-26 DIAGNOSIS — E559 Vitamin D deficiency, unspecified: Secondary | ICD-10-CM | POA: Diagnosis not present

## 2023-02-01 DIAGNOSIS — Z79899 Other long term (current) drug therapy: Secondary | ICD-10-CM | POA: Diagnosis not present

## 2023-02-01 DIAGNOSIS — M542 Cervicalgia: Secondary | ICD-10-CM | POA: Diagnosis not present

## 2023-02-01 DIAGNOSIS — E6609 Other obesity due to excess calories: Secondary | ICD-10-CM | POA: Diagnosis not present

## 2023-02-01 DIAGNOSIS — Z3200 Encounter for pregnancy test, result unknown: Secondary | ICD-10-CM | POA: Diagnosis not present

## 2023-02-01 DIAGNOSIS — Z6835 Body mass index (BMI) 35.0-35.9, adult: Secondary | ICD-10-CM | POA: Diagnosis not present

## 2023-02-01 DIAGNOSIS — M545 Low back pain, unspecified: Secondary | ICD-10-CM | POA: Diagnosis not present

## 2023-02-01 DIAGNOSIS — M546 Pain in thoracic spine: Secondary | ICD-10-CM | POA: Diagnosis not present

## 2023-02-01 DIAGNOSIS — E559 Vitamin D deficiency, unspecified: Secondary | ICD-10-CM | POA: Diagnosis not present

## 2023-02-04 DIAGNOSIS — Z79899 Other long term (current) drug therapy: Secondary | ICD-10-CM | POA: Diagnosis not present

## 2023-02-09 DIAGNOSIS — Z419 Encounter for procedure for purposes other than remedying health state, unspecified: Secondary | ICD-10-CM | POA: Diagnosis not present

## 2023-02-12 DIAGNOSIS — Z1159 Encounter for screening for other viral diseases: Secondary | ICD-10-CM | POA: Diagnosis not present

## 2023-02-26 ENCOUNTER — Ambulatory Visit (INDEPENDENT_AMBULATORY_CARE_PROVIDER_SITE_OTHER): Payer: Commercial Managed Care - PPO | Admitting: Nurse Practitioner

## 2023-02-26 ENCOUNTER — Other Ambulatory Visit (HOSPITAL_COMMUNITY): Payer: Self-pay

## 2023-02-26 ENCOUNTER — Encounter: Payer: Self-pay | Admitting: Nurse Practitioner

## 2023-02-26 ENCOUNTER — Other Ambulatory Visit: Payer: Self-pay

## 2023-02-26 VITALS — BP 96/70 | HR 87 | Temp 98.1°F | Ht 61.0 in | Wt 197.0 lb

## 2023-02-26 DIAGNOSIS — E66812 Obesity, class 2: Secondary | ICD-10-CM | POA: Diagnosis not present

## 2023-02-26 DIAGNOSIS — Z6835 Body mass index (BMI) 35.0-35.9, adult: Secondary | ICD-10-CM | POA: Diagnosis not present

## 2023-02-26 DIAGNOSIS — R748 Abnormal levels of other serum enzymes: Secondary | ICD-10-CM

## 2023-02-26 MED ORDER — WEGOVY 0.5 MG/0.5ML ~~LOC~~ SOAJ
0.5000 mg | SUBCUTANEOUS | 0 refills | Status: DC
  Filled 2023-02-26 (×2): qty 2, 28d supply, fill #0

## 2023-02-26 MED ORDER — WEGOVY 0.25 MG/0.5ML ~~LOC~~ SOAJ
0.2500 mg | SUBCUTANEOUS | 0 refills | Status: DC
  Filled 2023-02-26 – 2023-03-01 (×2): qty 2, 28d supply, fill #0

## 2023-02-26 NOTE — Assessment & Plan Note (Signed)
Chronic Starting weight 187 pounds, starting BMI 35.33 Current weight 197 pounds, BMI 37.22 Patient tolerating Wegovy well, she will complete 1 additional 0.25 mg subcutaneous injection then we will increase to 0.5 mg/week subcutaneous injection. Patient continues to not be sexually active but is on oral contraceptive pill.  She was again educated that the [facility] can reduce effectiveness of oral contraceptive pill thus she should use backup contraception option such as condom when sexually active.  She was also again told that if she misses a menstrual period she needs to not take any additional doses of Wegovy until pregnancy is ruled out.  She reports understanding.  Currently on menstrual cycle right now. Patient encouraged to continue following healthy diet. Follow-up in 4 weeks, or sooner as needed.

## 2023-02-26 NOTE — Progress Notes (Signed)
Established Patient Office Visit  Subjective   Patient ID: Anita Allen, female    DOB: April 16, 1991  Age: 32 y.o. MRN: [id number]  Chief Complaint  Patient presents with   Medical Management of Chronic Issues    Increase dosage of Wegovy    Obesity: Started Wegovy 0.25 mg/week subcutaneous injection.  Unfortunately last week, medication was discharged from needle when needle was not in subcutaneous tissue, so patient missed last week's dose.  She has completed 3 doses and has tolerated this well so far.  Had some mild nausea but no vomiting, significant abdominal pain, constipation, diarrhea, etc. she would like to discuss increasing dose that she is actually gained 10 pounds in starting the [facility].  Elevated Alkaline Phosphatase: Identified on last labs.  Per previous metabolic panel alkaline phosphatase is trending down.  Patient is postpartum.         Objective:     BP 96/70   Pulse 87   Temp 98.1 F (36.7 C) (Temporal)   Ht 5\' 1"  (1.549 m)   Wt 197 lb (89.4 kg)   LMP 02/26/2023 (Exact Date)   SpO2 100%   BMI 37.22 kg/m  BP Readings from Last 3 Encounters:  02/26/23 96/70  01/15/23 106/74  10/20/22 126/84   Wt Readings from Last 3 Encounters:  02/26/23 197 lb (89.4 kg)  01/15/23 187 lb (84.8 kg)  10/20/22 194 lb (88 kg)      Physical Exam Vitals reviewed.  Constitutional:      General: She is not in acute distress.    Appearance: Normal appearance.  HENT:     Head: Normocephalic and atraumatic.  Neck:     Thyroid: No thyroid mass or thyromegaly.  Cardiovascular:     Rate and Rhythm: Normal rate and regular rhythm.     Pulses: Normal pulses.     Heart sounds: Normal heart sounds.  Pulmonary:     Effort: Pulmonary effort is normal.     Breath sounds: Normal breath sounds.  Skin:    General: Skin is warm and dry.  Neurological:     General: No focal deficit present.     Mental Status: She is alert and oriented to person, place, and time.   Psychiatric:        Mood and Affect: Mood normal.        Behavior: Behavior normal.        Judgment: Judgment normal.      No results found for any visits on 02/26/23.    The ASCVD Risk score (Arnett DK, et al., 2019) failed to calculate for the following reasons:   The 2019 ASCVD risk score is only valid for ages 18 to 66    Assessment & Plan:   Problem List Items Addressed This Visit       Other   Class 2 obesity without serious comorbidity with body mass index (BMI) of 35.0 to 35.9 in adult    Chronic Starting weight 187 pounds, starting BMI 35.33 Current weight 197 pounds, BMI 37.22 Patient tolerating Wegovy well, she will complete 1 additional 0.25 mg subcutaneous injection then we will increase to 0.5 mg/week subcutaneous injection. Patient continues to not be sexually active but is on oral contraceptive pill.  She was again educated that the [facility] can reduce effectiveness of oral contraceptive pill thus she should use backup contraception option such as condom when sexually active.  She was also again told that if she misses a menstrual period she needs  to not take any additional doses of Wegovy until pregnancy is ruled out.  She reports understanding.  Currently on menstrual cycle right now. Patient encouraged to continue following healthy diet. Follow-up in 4 weeks, or sooner as needed.      Relevant Medications   Semaglutide-Weight Management (WEGOVY) 0.25 MG/0.5ML SOAJ   Semaglutide-Weight Management (WEGOVY) 0.5 MG/0.5ML SOAJ   Elevated alkaline phosphatase level - Primary    Incidental finding, asymptomatic Plan on rechecking within the next few months to make sure this is trending downward, most likely related to recent pregnancy.       Return in about 4 weeks (around 03/26/2023).    Elenore Paddy, NP

## 2023-02-26 NOTE — Progress Notes (Signed)
This encounter was created in error - please disregard.

## 2023-02-26 NOTE — Assessment & Plan Note (Signed)
Incidental finding, asymptomatic Plan on rechecking within the next few months to make sure this is trending downward, most likely related to recent pregnancy.

## 2023-03-01 ENCOUNTER — Other Ambulatory Visit (HOSPITAL_COMMUNITY): Payer: Self-pay

## 2023-03-03 ENCOUNTER — Telehealth: Payer: Commercial Managed Care - PPO | Admitting: Physician Assistant

## 2023-03-03 DIAGNOSIS — M542 Cervicalgia: Secondary | ICD-10-CM | POA: Diagnosis not present

## 2023-03-03 DIAGNOSIS — Z79899 Other long term (current) drug therapy: Secondary | ICD-10-CM | POA: Diagnosis not present

## 2023-03-03 DIAGNOSIS — M546 Pain in thoracic spine: Secondary | ICD-10-CM | POA: Diagnosis not present

## 2023-03-03 DIAGNOSIS — N760 Acute vaginitis: Secondary | ICD-10-CM

## 2023-03-03 DIAGNOSIS — Z6836 Body mass index (BMI) 36.0-36.9, adult: Secondary | ICD-10-CM | POA: Diagnosis not present

## 2023-03-03 DIAGNOSIS — E559 Vitamin D deficiency, unspecified: Secondary | ICD-10-CM | POA: Diagnosis not present

## 2023-03-03 DIAGNOSIS — Z3200 Encounter for pregnancy test, result unknown: Secondary | ICD-10-CM | POA: Diagnosis not present

## 2023-03-03 DIAGNOSIS — M545 Low back pain, unspecified: Secondary | ICD-10-CM | POA: Diagnosis not present

## 2023-03-03 DIAGNOSIS — E6609 Other obesity due to excess calories: Secondary | ICD-10-CM | POA: Diagnosis not present

## 2023-03-03 MED ORDER — METRONIDAZOLE 500 MG PO TABS: 500.0000 mg | ORAL_TABLET | Freq: Two times a day (BID) | ORAL | 0 refills | Status: DC

## 2023-03-03 NOTE — Addendum Note (Signed)
Addended by: Waldon Merl on: 03/03/2023 02:50 PM   Modules accepted: Orders, Level of Service

## 2023-03-03 NOTE — Progress Notes (Addendum)

## 2023-03-03 NOTE — Progress Notes (Signed)
I have spent 5 minutes in review of e-visit questionnaire, review and updating patient chart, medical decision making and response to patient.   Mia Milan Cody Jacklynn Dehaas, PA-C    

## 2023-03-04 ENCOUNTER — Ambulatory Visit (INDEPENDENT_AMBULATORY_CARE_PROVIDER_SITE_OTHER): Payer: Commercial Managed Care - PPO | Admitting: Dermatology

## 2023-03-04 ENCOUNTER — Encounter: Payer: Self-pay | Admitting: Dermatology

## 2023-03-04 VITALS — BP 121/80

## 2023-03-04 DIAGNOSIS — L709 Acne, unspecified: Secondary | ICD-10-CM

## 2023-03-04 MED ORDER — RETIN-A 0.025 % EX CREA: TOPICAL_CREAM | Freq: Every day | CUTANEOUS | 3 refills | Status: DC

## 2023-03-04 MED ORDER — CLINDAMYCIN PHOSPHATE 1 % EX LOTN
TOPICAL_LOTION | Freq: Every day | CUTANEOUS | 3 refills | Status: DC
Start: 2023-03-04 — End: 2023-12-24

## 2023-03-04 NOTE — Patient Instructions (Addendum)
Hello  Anita Allen,  Thank you for visiting my office today. Your dedication to enhancing your skin health is greatly appreciated. Below is a detailed summary of our treatment strategy designed to manage your acne effectively:  - Morning Routine:   - Cleansing: Begin by washing your face, chest, and back with CeraVe containing benzoyl peroxide.   - Hydration: Apply a thin layer of hyaluronic acid, with Vichy Mineral 89 being the recommended product.   - Medication: Follow up with the application of Clindamycin lotion.  - Night Routine:   - Cleansing: Use the CeraVe hydrating cleanser for your face (samples provided).   - Hydration: Apply a thin layer of hyaluronic acid.   - Treatment with Retin-A (tretinoin) 0.025% cream:     - For the face, use a pea-sized amount.     - For the back and chest, mix a pea-sized amount with a quarter-sized amount of moisturizer.     - Schedule the application for three nights a week (Monday, Wednesday, Friday). Should irritation occur, reduce the frequency to two nights a week.  - General Instructions:   - Be cautious to avoid applying Retin-A near the lips and eyes.   - Anticipate noticeable improvements every 4 weeks, aiming for significant progress within approximately 3 months.   - A follow-up appointment is scheduled in 3 months to evaluate your progress and make any necessary adjustments to your treatment plan.  The prescribed medications and products will be covered by your insurance and are being sent to your pharmacy. Additionally, we have provided samples of some products during your visit today.  We eagerly anticipate witnessing the positive transformations in your skin. Should you have any questions or concerns before our next meeting, please feel free to reach out to our office.  Best regards,  Dr. Langston Reusing,  Dermatologist        Important Information   Due to recent changes in healthcare laws, you may see results of your pathology  and/or laboratory studies on MyChart before the doctors have had a chance to review them. We understand that in some cases there may be results that are confusing or concerning to you. Please understand that not all results are received at the same time and often the doctors may need to interpret multiple results in order to provide you with the best plan of care or course of treatment. Therefore, we ask that you please give [place] 2 business days to thoroughly review all your results before contacting the office for clarification. Should we see a critical lab result, you will be contacted sooner.     If You Need Anything After Your Visit   If you have any questions or concerns for your doctor, please call our main line at [phone]. If no one answers, please leave a voicemail as directed and we will return your call as soon as possible. Messages left after 4 pm will be answered the following business day.    You may also send [place] a message via MyChart. We typically respond to MyChart messages within 1-2 business days.  For prescription refills, please ask your pharmacy to contact our office. Our fax number is [phone].  If you have an urgent issue when the clinic is closed that cannot wait until the next business day, you can page your doctor at the number below.     Please note that while we do our best to be available for urgent issues outside of office hours, we are not  available 24/7.    If you have an urgent issue and are unable to reach [place], you may choose to seek medical care at your doctor's office, retail clinic, urgent care center, or emergency room.   If you have a medical emergency, please immediately call 911 or go to the emergency department. In the event of inclement weather, please call our main line at [phone] for an update on the status of any delays or closures.  Dermatology Medication Tips: Please keep the boxes that topical medications come in in order to help keep  track of the instructions about where and how to use these. Pharmacies typically print the medication instructions only on the boxes and not directly on the medication tubes.   If your medication is too expensive, please contact our office at [phone] or send [place] a message through MyChart.    We are unable to tell what your co-pay for medications will be in advance as this is different depending on your insurance coverage. However, we may be able to find a substitute medication at lower cost or fill out paperwork to get insurance to cover a needed medication.    If a prior authorization is required to get your medication covered by your insurance company, please allow [place] 1-2 business days to complete this process.   Drug prices often vary depending on where the prescription is filled and some pharmacies may offer cheaper prices.   The website www.goodrx.com contains coupons for medications through different pharmacies. The prices here do not account for what the cost may be with help from insurance (it may be cheaper with your insurance), but the website can give you the price if you did not use any insurance.  - You can print the associated coupon and take it with your prescription to the pharmacy.  - You may also stop by our office during regular business hours and pick up a GoodRx coupon card.  - If you need your prescription sent electronically to a different pharmacy, notify our office through [facility] or by phone at [phone]

## 2023-03-04 NOTE — Progress Notes (Signed)
   New Patient Visit   Subjective  Anita Allen is a 32 y.o. female who presents for the following: Acne  Patient states she has acne located at the face and back that she would like to have examined. Patient reports the areas have been there for several years. She reports the areas are bothersome. Patient reports the area can be painful. Patient rates irritation 8 out of 10. She states that the areas have spread. Patient reports she has not previously been treated for these areas.   The following portions of the chart were reviewed this encounter and updated as appropriate: medications, allergies, medical history  Review of Systems:  No other skin or systemic complaints except as noted in HPI or Assessment and Plan.  Objective  Well appearing patient in no apparent distress; mood and affect are within normal limits.  A focused examination was performed of the following areas:          Relevant exam findings are noted in the Assessment and Plan.    Assessment & Plan   ACNE VULGARIS Exam: Open comedones and inflammatory papules  Flared  Treatment Plan: - We will prescribe Clindamycin Lotion, followed by  - We will plan to prescribe Retnin A (Brand Name) to apply to the face on M-W- & Fr nights, advised to apply Vichy 89 prior to prevent excessive dryness - Recommended continuing Cerave 4%BPO in the mornings but at night recommended using Cerave Hydrating facial wash      Acne, unspecified acne type  Related Medications RETIN-A 0.025 % cream Apply topically at bedtime. Apply on M-W-F  clindamycin (CLEOCIN-T) 1 % lotion Apply topically daily.    Return in about 3 months (around 06/04/2023).  Documentation: I have reviewed the above documentation for accuracy and completeness, and I agree with the above.  Langston Reusing, DO

## 2023-03-09 ENCOUNTER — Telehealth: Payer: Self-pay | Admitting: Nurse Practitioner

## 2023-03-09 DIAGNOSIS — R110 Nausea: Secondary | ICD-10-CM

## 2023-03-09 NOTE — Telephone Encounter (Signed)
Pt called stating she is been sick all weekend every since she the Dr. Micah Flesher up on up her dosage for the wegovy. Pt is so nauseated that she feels like she is on a boat ride and been sleeping all day. Pt calling asking is there anyway she can get something so she can feel better.

## 2023-03-10 MED ORDER — ONDANSETRON HCL 4 MG PO TABS
4.0000 mg | ORAL_TABLET | Freq: Three times a day (TID) | ORAL | 0 refills | Status: DC | PRN
Start: 2023-03-10 — End: 2023-09-08

## 2023-03-10 NOTE — Telephone Encounter (Signed)
I have sent in ondansetron (Zofran).  She can take 1 tablet by mouth every 8 hours as needed for nausea.  I also recommend that she go back down to the previous dose next time the [facility] is due.  From what I can tell we had just gone up to the 0.5 mg/week so she should return to 0.25 mg/week.

## 2023-03-12 DIAGNOSIS — Z419 Encounter for procedure for purposes other than remedying health state, unspecified: Secondary | ICD-10-CM | POA: Diagnosis not present

## 2023-04-05 ENCOUNTER — Telehealth: Payer: Medicaid Other

## 2023-04-05 DIAGNOSIS — N760 Acute vaginitis: Secondary | ICD-10-CM

## 2023-04-06 ENCOUNTER — Ambulatory Visit (INDEPENDENT_AMBULATORY_CARE_PROVIDER_SITE_OTHER): Payer: Medicaid Other | Admitting: Family Medicine

## 2023-04-06 ENCOUNTER — Other Ambulatory Visit (HOSPITAL_COMMUNITY): Payer: Self-pay

## 2023-04-06 ENCOUNTER — Encounter: Payer: Self-pay | Admitting: Family Medicine

## 2023-04-06 VITALS — BP 102/76 | HR 91 | Temp 97.8°F | Ht 61.0 in | Wt 191.0 lb

## 2023-04-06 DIAGNOSIS — M542 Cervicalgia: Secondary | ICD-10-CM | POA: Diagnosis not present

## 2023-04-06 DIAGNOSIS — K5904 Chronic idiopathic constipation: Secondary | ICD-10-CM

## 2023-04-06 DIAGNOSIS — E282 Polycystic ovarian syndrome: Secondary | ICD-10-CM

## 2023-04-06 DIAGNOSIS — E6609 Other obesity due to excess calories: Secondary | ICD-10-CM | POA: Diagnosis not present

## 2023-04-06 DIAGNOSIS — Z6835 Body mass index (BMI) 35.0-35.9, adult: Secondary | ICD-10-CM

## 2023-04-06 DIAGNOSIS — Z79899 Other long term (current) drug therapy: Secondary | ICD-10-CM | POA: Diagnosis not present

## 2023-04-06 DIAGNOSIS — R748 Abnormal levels of other serum enzymes: Secondary | ICD-10-CM

## 2023-04-06 DIAGNOSIS — E66812 Obesity, class 2: Secondary | ICD-10-CM | POA: Diagnosis not present

## 2023-04-06 DIAGNOSIS — Z6836 Body mass index (BMI) 36.0-36.9, adult: Secondary | ICD-10-CM | POA: Diagnosis not present

## 2023-04-06 DIAGNOSIS — M545 Low back pain, unspecified: Secondary | ICD-10-CM | POA: Diagnosis not present

## 2023-04-06 DIAGNOSIS — M546 Pain in thoracic spine: Secondary | ICD-10-CM | POA: Diagnosis not present

## 2023-04-06 DIAGNOSIS — Z3200 Encounter for pregnancy test, result unknown: Secondary | ICD-10-CM | POA: Diagnosis not present

## 2023-04-06 MED ORDER — SEMAGLUTIDE-WEIGHT MANAGEMENT 1 MG/0.5ML ~~LOC~~ SOAJ
1.0000 mg | SUBCUTANEOUS | 0 refills | Status: DC
Start: 2023-06-03 — End: 2023-05-07
  Filled 2023-04-06: qty 2, 28d supply, fill #0

## 2023-04-06 MED ORDER — SEMAGLUTIDE-WEIGHT MANAGEMENT 1 MG/0.5ML ~~LOC~~ SOAJ
1.0000 mg | SUBCUTANEOUS | 0 refills | Status: DC
Start: 2023-06-03 — End: 2023-04-06

## 2023-04-06 MED ORDER — LINACLOTIDE 145 MCG PO CAPS
145.0000 ug | ORAL_CAPSULE | Freq: Every day | ORAL | 1 refills | Status: DC
Start: 2023-04-06 — End: 2024-03-16

## 2023-04-06 NOTE — Progress Notes (Signed)
Acute Office Visit  Subjective:     Patient ID: Anita Allen, female    DOB: March 25, 1991, 31 y.o.   MRN: [id number]  Chief Complaint  Patient presents with   Weight Loss    Would like to discuss wegovy increase    HPI Patient is in today to discuss increasing Wegovy. She was previously on 0.5 mg once a week.  Reports that she was doing well with this. Reports that she did have some nausea and vomiting, but also was sick with something like the fluid that happened. States that she continued at this dose for the last 3 weeks with no issues. Does have chronic constipation, requesting prescription strength medication to help with this. States that she is drinking 2 bottles of magnesium citrate per week.  States that this is an issue before the [facility], and that it just has not gotten better. Has previously taken Linzess, would like to try that again. Denies abdominal pain, nausea, vomiting, diarrhea, rash, fever, other symptoms. Medical history of below.  ROS Per HPI      Objective:    BP 102/76 (BP Location: Left Arm, Patient Position: Sitting, Cuff Size: Large)   Pulse 91   Temp 97.8 F (36.6 C) (Temporal)   Ht 5\' 1"  (1.549 m)   Wt 191 lb (86.6 kg)   SpO2 98%   BMI 36.09 kg/m    Physical Exam Vitals and nursing note reviewed.  Constitutional:      Appearance: Normal appearance. She is obese.  HENT:     Head: Normocephalic and atraumatic.  Eyes:     Extraocular Movements: Extraocular movements intact.     Pupils: Pupils are equal, round, and reactive to light.  Cardiovascular:     Rate and Rhythm: Normal rate and regular rhythm.     Heart sounds: Normal heart sounds.  Pulmonary:     Effort: Pulmonary effort is normal.     Breath sounds: Normal breath sounds.  Abdominal:     General: Abdomen is flat.     Palpations: Abdomen is soft.  Musculoskeletal:        General: Normal range of motion.     Cervical back: Normal range of motion and neck supple.   Neurological:     Mental Status: She is alert.     No results found for any visits on 04/06/23.      Assessment & Plan:  1. PCOS (polycystic ovarian syndrome) - Semaglutide-Weight Management 1 MG/0.5ML SOAJ; Inject 1 mg into the skin once a week for 28 days.  Dispense: 2 mL; Refill: 0  2. Class 2 obesity without serious comorbidity with body mass index (BMI) of 35.0 to 35.9 in adult, unspecified obesity type - Semaglutide-Weight Management 1 MG/0.5ML SOAJ; Inject 1 mg into the skin once a week for 28 days.  Dispense: 2 mL; Refill: 0  3. Elevated alkaline phosphatase level - Semaglutide-Weight Management 1 MG/0.5ML SOAJ; Inject 1 mg into the skin once a week for 28 days.  Dispense: 2 mL; Refill: 0  4. Chronic idiopathic constipation - linaclotide (LINZESS) 145 MCG CAPS capsule; Take 1 capsule (145 mcg total) by mouth daily before breakfast.  Dispense: 90 capsule; Refill: 1   Meds ordered this encounter  Medications   DISCONTD: Semaglutide-Weight Management 1 MG/0.5ML SOAJ    Sig: Inject 1 mg into the skin once a week for 28 days.    Dispense:  2 mL    Refill:  0   linaclotide (LINZESS) 145  MCG CAPS capsule    Sig: Take 1 capsule (145 mcg total) by mouth daily before breakfast.    Dispense:  90 capsule    Refill:  1   Semaglutide-Weight Management 1 MG/0.5ML SOAJ    Sig: Inject 1 mg into the skin once a week for 28 days.    Dispense:  2 mL    Refill:  0    Return in about 4 weeks (around 05/04/2023) for follow up meds.  Moshe Cipro, FNP

## 2023-04-06 NOTE — Progress Notes (Signed)
Because of frequent vaginitis, most recently in past month at which time we wanted you to be evaluated in person but agreed to treat giving prior medication issue, I feel your condition warrants further evaluation and I recommend that you be seen in a face to face visit.   NOTE: There will be NO CHARGE for this eVisit   If you are having a true medical emergency please call 911.      For an urgent face to face visit, [place] has eight urgent care centers for your convenience:   NEW!! [facility] Center at [facility] Get Driving Directions [phone] [address], [address] [place], [address]    [facility] Health Urgent Care Center at [facility] Get Driving Directions [phone] [address] [place], [place] [address]   [facility] Urgent Care Center [facility]) Get Driving Directions [phone] [address] [place], [place] [address]  [facility] Health Urgent Care Center [facility]) Get Driving Directions [phone] [address] [place],  [place]  [address]  [facility] Health Urgent Care Center [facility] - at [employer]  [phone] [address] [place],  [place] [address]   [facility] Health Urgent Care at [facility] Get Driving Directions [phone] [address] [place], [place] [address]   [facility] Health Urgent Care at [facility] Get Driving Directions  [phone] [address] [place], [place] [address]   [facility] Urgent Care at [facility] Center Directions [phone] [address] [place], [place] [address]  Your MyChart E-visit questionnaire answers were reviewed by a board certified advanced clinical practitioner to complete your personal care plan based on your specific symptoms.  Thank you for using e-Visits.

## 2023-04-09 DIAGNOSIS — Z79899 Other long term (current) drug therapy: Secondary | ICD-10-CM | POA: Diagnosis not present

## 2023-04-11 DIAGNOSIS — Z419 Encounter for procedure for purposes other than remedying health state, unspecified: Secondary | ICD-10-CM | POA: Diagnosis not present

## 2023-04-21 ENCOUNTER — Encounter (HOSPITAL_BASED_OUTPATIENT_CLINIC_OR_DEPARTMENT_OTHER): Payer: Self-pay

## 2023-04-21 ENCOUNTER — Emergency Department (HOSPITAL_BASED_OUTPATIENT_CLINIC_OR_DEPARTMENT_OTHER)
Admission: EM | Admit: 2023-04-21 | Discharge: 2023-04-22 | Disposition: A | Discharge status: Home / Self Care | Payer: Medicaid Other | Attending: Emergency Medicine | Admitting: Emergency Medicine

## 2023-04-21 ENCOUNTER — Other Ambulatory Visit: Payer: Self-pay

## 2023-04-21 DIAGNOSIS — N898 Other specified noninflammatory disorders of vagina: Secondary | ICD-10-CM | POA: Insufficient documentation

## 2023-04-21 DIAGNOSIS — Z91040 Latex allergy status: Secondary | ICD-10-CM | POA: Insufficient documentation

## 2023-04-21 DIAGNOSIS — D72829 Elevated white blood cell count, unspecified: Secondary | ICD-10-CM | POA: Diagnosis not present

## 2023-04-21 LAB — URINALYSIS, ROUTINE W REFLEX MICROSCOPIC
Bacteria, UA: NONE SEEN
Bilirubin Urine: NEGATIVE
Glucose, UA: NEGATIVE mg/dL
Ketones, ur: NEGATIVE mg/dL
Nitrite: NEGATIVE
Specific Gravity, Urine: 1.025 (ref 1.005–1.030)
pH: 5.5 (ref 5.0–8.0)

## 2023-04-21 LAB — PREGNANCY, URINE: Preg Test, Ur: POSITIVE — AB

## 2023-04-21 NOTE — ED Triage Notes (Signed)
Patient presents to ED reporting completing a pregnancy termination at home with medication she ordered online a month ago. Patient states she started having yellow discharge for two days. Patient is concerned that the medication did not work or worried she may have an STD. Patient also reports lower abdominal cramping.

## 2023-04-22 LAB — CBC WITH DIFFERENTIAL/PLATELET
Abs Immature Granulocytes: 0.04 10*3/uL (ref 0.00–0.07)
Basophils Absolute: 0.1 10*3/uL (ref 0.0–0.1)
Basophils Relative: 1 %
Eosinophils Absolute: 0.3 10*3/uL (ref 0.0–0.5)
Eosinophils Relative: 2 %
HCT: 40.9 % (ref 36.0–46.0)
Hemoglobin: 13.3 g/dL (ref 12.0–15.0)
Immature Granulocytes: 0 %
Lymphocytes Relative: 20 %
Lymphs Abs: 2.5 10*3/uL (ref 0.7–4.0)
MCH: 26.4 pg (ref 26.0–34.0)
MCHC: 32.5 g/dL (ref 30.0–36.0)
MCV: 81.3 fL (ref 80.0–100.0)
Monocytes Absolute: 0.7 10*3/uL (ref 0.1–1.0)
Monocytes Relative: 5 %
Neutro Abs: 9.3 10*3/uL — ABNORMAL HIGH (ref 1.7–7.7)
Neutrophils Relative %: 72 %
Platelets: 395 10*3/uL (ref 150–400)
RBC: 5.03 MIL/uL (ref 3.87–5.11)
RDW: 16.6 % — ABNORMAL HIGH (ref 11.5–15.5)
WBC: 12.9 10*3/uL — ABNORMAL HIGH (ref 4.0–10.5)
nRBC: 0 % (ref 0.0–0.2)

## 2023-04-22 LAB — WET PREP, GENITAL
Sperm: NONE SEEN
Trich, Wet Prep: NONE SEEN
WBC, Wet Prep HPF POC: >=10 — AB (ref <10)
Yeast Wet Prep HPF POC: NONE SEEN

## 2023-04-22 LAB — BASIC METABOLIC PANEL
Anion gap: 5 (ref 5–15)
BUN: 9 mg/dL (ref 6–20)
CO2: 24 mmol/L (ref 22–32)
Calcium: 11 mg/dL — ABNORMAL HIGH (ref 8.9–10.3)
Chloride: 107 mmol/L (ref 98–111)
Creatinine, Ser: 0.6 mg/dL (ref 0.44–1.00)
GFR, Estimated: >60 mL/min (ref >60)
Glucose, Bld: 93 mg/dL (ref 70–99)
Potassium: 4.6 mmol/L (ref 3.5–5.1)
Sodium: 136 mmol/L (ref 135–145)

## 2023-04-22 LAB — HCG, QUANTITATIVE, PREGNANCY: hCG, Beta Chain, Quant, S: 179 m[IU]/mL — ABNORMAL HIGH (ref <5)

## 2023-04-22 MED ORDER — AZITHROMYCIN 250 MG PO TABS
1000.0000 mg | ORAL_TABLET | Freq: Once | ORAL | Status: AC
  Administered 2023-04-22: 1000 mg via ORAL
  Filled 2023-04-22: qty 4

## 2023-04-22 MED ORDER — METRONIDAZOLE 500 MG PO TABS: 500.0000 mg | ORAL_TABLET | Freq: Two times a day (BID) | ORAL | 0 refills | Status: DC

## 2023-04-22 MED ORDER — CEFTRIAXONE SODIUM 500 MG IJ SOLR
500.0000 mg | Freq: Once | INTRAMUSCULAR | Status: AC
  Administered 2023-04-22: 500 mg via INTRAMUSCULAR
  Filled 2023-04-22: qty 500

## 2023-04-22 NOTE — Discharge Instructions (Addendum)
Begin taking Flagyl as prescribed.  Return tomorrow at the given time for an ultrasound.  Follow-up with your primary doctor/GYN if symptoms are not improving.

## 2023-04-22 NOTE — ED Provider Notes (Signed)
[place] EMERGENCY DEPARTMENT AT [facility] Provider Note   CSN: [id number] Arrival date & time: 04/21/23  2034     History  Chief Complaint  Patient presents with   Vaginal Discharge    Anita Allen is a 32 y.o. female.  Patient is a 32 year old female with history of polycystic ovaries.  Patient presenting today with complaints of vaginal discharge.  5 weeks ago she reports taking an abortion medication she received online.  She reports having some bleeding in the days afterward along with cramping.  She now presents with brownish-yellow vaginal discharge.  She does describe some cramping in the suprapubic region, but denies constant abdominal pain.  She believes as though her significant other has been unfaithful and is concerned about STDs.  The history is provided by the patient.       Home Medications Prior to Admission medications   Medication Sig Start Date End Date Taking? Authorizing Provider  clindamycin (CLEOCIN-T) 1 % lotion Apply topically daily. 03/04/23 03/03/24  Terri Piedra, DO  linaclotide [facility]) 145 MCG CAPS capsule Take 1 capsule (145 mcg total) by mouth daily before breakfast. 04/06/23 10/03/23  Moshe Cipro, FNP  metroNIDAZOLE (FLAGYL) 500 MG tablet Take 1 tablet (500 mg total) by mouth 2 (two) times daily. 03/03/23   Waldon Merl, PA-C  norgestimate-ethinyl estradiol (ORTHO-CYCLEN) 0.25-35 MG-MCG tablet Take 1 tablet by mouth daily. Patient not taking: Reported on 03/04/2023    [provider]  ondansetron (ZOFRAN) 4 MG tablet Take 1 tablet (4 mg total) by mouth every 8 (eight) hours as needed for nausea or vomiting. 03/10/23   Elenore Paddy, NP  oxyCODONE-acetaminophen (PERCOCET) 10-325 MG tablet Take 1 tablet by mouth 2 (two) times daily as needed. 03/04/23   [provider]  Prenatal MV-Min-Fe Fum-FA-DHA (PRENATAL 1 PO) Take by mouth.    [provider]  RETIN-A 0.025 % cream Apply topically at  bedtime. Apply on M-W-F 03/04/23   Terri Piedra, DO  Semaglutide-Weight Management 1 MG/0.5ML SOAJ Inject 1 mg into the skin once a week for 28 days. 06/03/23 07/01/23  Moshe Cipro, FNP  Vitamin D, Ergocalciferol, (DRISDOL) 1.25 MG (50000 UNIT) CAPS capsule Take 50,000 Units by mouth once a week. 02/01/23   [provider]      Allergies    Augmentin [amoxicillin-pot clavulanate] and Latex    Review of Systems   Review of Systems  All other systems reviewed and are negative.   Physical Exam Updated Vital Signs BP 121/81   Pulse 83   Temp 98.8 F (37.1 C)   Resp 16   Ht 5\' 1"  (1.549 m)   Wt 86.6 kg   LMP  (LMP Unknown)   SpO2 100%   BMI 36.09 kg/m  Physical Exam Vitals and nursing note reviewed.  Constitutional:      General: She is not in acute distress.    Appearance: She is well-developed. She is not diaphoretic.  HENT:     Head: Normocephalic and atraumatic.  Cardiovascular:     Rate and Rhythm: Normal rate and regular rhythm.     Heart sounds: No murmur heard.    No friction rub. No gallop.  Pulmonary:     Effort: Pulmonary effort is normal. No respiratory distress.     Breath sounds: Normal breath sounds. No wheezing.  Abdominal:     General: Bowel sounds are normal. There is no distension.     Palpations: Abdomen is soft.  Tenderness: There is no abdominal tenderness.  Musculoskeletal:        General: Normal range of motion.     Cervical back: Normal range of motion and neck supple.  Skin:    General: Skin is warm and dry.  Neurological:     General: No focal deficit present.     Mental Status: She is alert and oriented to person, place, and time.     ED Results / Procedures / Treatments   Labs (all labs ordered are listed, but only abnormal results are displayed) Labs Reviewed  URINALYSIS, ROUTINE W REFLEX MICROSCOPIC - Abnormal; Notable for the following components:      Result Value   APPearance HAZY (*)    Hgb urine  dipstick LARGE (*)    Protein, ur TRACE (*)    Leukocytes,Ua LARGE (*)    All other components within normal limits  PREGNANCY, URINE - Abnormal; Notable for the following components:   Preg Test, Ur POSITIVE (*)    All other components within normal limits  WET PREP, GENITAL  BASIC METABOLIC PANEL  CBC WITH DIFFERENTIAL/PLATELET  HCG, QUANTITATIVE, PREGNANCY  GC/CHLAMYDIA PROBE AMP ([place]) NOT AT [facility]    EKG None  Radiology No results found.  Procedures Procedures    Medications Ordered in ED Medications - No data to display  ED Course/ Medical Decision Making/ A&P  Patient is a 32 year old female presenting with complaints of vaginal discharge as described in the HPI.  She recently had a chemically induced abortion with the pills she was given after online consultation.  She is now having discharge and concern that her significant other has been unfaithful.  Patient arrives with stable vital signs and is afebrile.  Physical examination basically unremarkable.  Abdomen is benign.  Laboratory studies obtained including CBC, metabolic panel, and quantitative hCG.  She has a mild leukocytosis and hCG is mildly positive at 179.  Urinalysis is clear.  Wet prep shows clue cells and white cells.  Patient has been given Rocephin and Zithromax to treat presumptively for STD.  I will also prescribe Flagyl due to the presence of clue cells.  I will also make arrangements for the patient to have an outpatient ultrasound to rule out retained products or other issues.  Patient to follow-up with her GYN.  Final Clinical Impression(s) / ED Diagnoses Final diagnoses:  None    Rx / DC Orders ED Discharge Orders     None         Geoffery Lyons, MD 04/22/23 [phone]

## 2023-04-23 LAB — GC/CHLAMYDIA PROBE AMP (~~LOC~~) NOT AT ARMC
Chlamydia: NEGATIVE
Comment: NEGATIVE
Comment: NORMAL
Neisseria Gonorrhea: NEGATIVE

## 2023-05-06 DIAGNOSIS — Z6836 Body mass index (BMI) 36.0-36.9, adult: Secondary | ICD-10-CM | POA: Diagnosis not present

## 2023-05-06 DIAGNOSIS — Z79899 Other long term (current) drug therapy: Secondary | ICD-10-CM | POA: Diagnosis not present

## 2023-05-06 DIAGNOSIS — Z3200 Encounter for pregnancy test, result unknown: Secondary | ICD-10-CM | POA: Diagnosis not present

## 2023-05-06 DIAGNOSIS — E6609 Other obesity due to excess calories: Secondary | ICD-10-CM | POA: Diagnosis not present

## 2023-05-06 DIAGNOSIS — M542 Cervicalgia: Secondary | ICD-10-CM | POA: Diagnosis not present

## 2023-05-06 DIAGNOSIS — M546 Pain in thoracic spine: Secondary | ICD-10-CM | POA: Diagnosis not present

## 2023-05-07 ENCOUNTER — Ambulatory Visit (INDEPENDENT_AMBULATORY_CARE_PROVIDER_SITE_OTHER): Payer: Medicaid Other | Admitting: Family Medicine

## 2023-05-07 ENCOUNTER — Other Ambulatory Visit (HOSPITAL_COMMUNITY): Payer: Self-pay

## 2023-05-07 ENCOUNTER — Encounter: Payer: Self-pay | Admitting: Family Medicine

## 2023-05-07 VITALS — BP 120/90 | HR 82 | Temp 98.0°F | Ht 61.0 in | Wt 187.2 lb

## 2023-05-07 DIAGNOSIS — K5904 Chronic idiopathic constipation: Secondary | ICD-10-CM

## 2023-05-07 DIAGNOSIS — E66812 Obesity, class 2: Secondary | ICD-10-CM | POA: Diagnosis not present

## 2023-05-07 DIAGNOSIS — E282 Polycystic ovarian syndrome: Secondary | ICD-10-CM

## 2023-05-07 DIAGNOSIS — Z6835 Body mass index (BMI) 35.0-35.9, adult: Secondary | ICD-10-CM | POA: Diagnosis not present

## 2023-05-07 MED ORDER — SEMAGLUTIDE-WEIGHT MANAGEMENT 1.7 MG/0.75ML ~~LOC~~ SOAJ
1.7000 mg | SUBCUTANEOUS | 0 refills | Status: DC
  Filled 2023-05-07: qty 3, 28d supply, fill #0

## 2023-05-07 MED ORDER — SEMAGLUTIDE-WEIGHT MANAGEMENT 2.4 MG/0.75ML ~~LOC~~ SOAJ
2.4000 mg | SUBCUTANEOUS | 3 refills | Status: DC
  Filled 2023-05-07: qty 3, fill #0
  Filled 2023-06-04: qty 3, 28d supply, fill #0
  Filled 2023-07-02: qty 3, 28d supply, fill #1
  Filled 2023-08-07 – 2023-08-10 (×2): qty 3, 28d supply, fill #2
  Filled 2023-10-11: qty 3, 28d supply, fill #3

## 2023-05-07 NOTE — Progress Notes (Signed)
Established Patient Office Visit  Subjective   Patient ID: Anita Allen, female    DOB: 1991/05/11  Age: 32 y.o. MRN: [id number]  Chief Complaint  Patient presents with   Medical Management of Chronic Issues    4 week follow up, medications (semaglutide and linzess). Notes that Linzess has been working very well for her at the current dosage. Semaglutide seems to be working well for patient also and they are not dealing with any symptoms from it.    HPI 32 year old female presents for medication management today. At her last visit, we started semaglutide 1.0 mg once weekly. She has lost 4 lbs since last visit. Also started Linzess at her last visit for constipation, reports that this is working well for her.  Denies side effects including nausea, vomiting, worsening constipation, diarrhea. Reports that she feels well on these medications and thinks they are doing well for her. Denies other concerns today. Medical history as outlined below.   ROS Per HPI    Objective:     BP (!) 120/90 (BP Location: Left Arm, Patient Position: Sitting)   Pulse 82   Temp 98 F (36.7 C) (Oral)   Ht 5\' 1"  (1.549 m)   Wt 187 lb 3.2 oz (84.9 kg)   LMP  (LMP Unknown)   SpO2 99%   BMI 35.37 kg/m   Physical Exam Vitals and nursing note reviewed.  Constitutional:      General: She is not in acute distress.    Appearance: Normal appearance. She is obese.  HENT:     Head: Normocephalic and atraumatic.     Left Ear: Ear canal normal.     Nose: Nose normal.  Eyes:     Extraocular Movements: Extraocular movements intact.  Cardiovascular:     Rate and Rhythm: Normal rate and regular rhythm.     Heart sounds: Normal heart sounds.  Pulmonary:     Effort: Pulmonary effort is normal.     Breath sounds: Normal breath sounds.  Musculoskeletal:        General: Normal range of motion.     Cervical back: Normal range of motion.  Neurological:     General: No focal deficit present.     Mental  Status: She is alert and oriented to person, place, and time.  Psychiatric:        Mood and Affect: Mood normal.        Thought Content: Thought content normal.     No results found for any visits on 05/07/23.   The ASCVD Risk score (Arnett DK, et al., 2019) failed to calculate for the following reasons:   The 2019 ASCVD risk score is only valid for ages 53 to 23    Assessment & Plan:   Class 2 obesity without serious comorbidity with body mass index (BMI) of 35.0 to 35.9 in adult, unspecified obesity type -     Semaglutide-Weight Management; Inject 1.7 mg into the skin once a week.  Dispense: 3 mL; Refill: 0 -     Semaglutide-Weight Management; Inject 2.4 mg into the skin once a week.  Dispense: 3 mL; Refill: 3  PCOS (polycystic ovarian syndrome) -     Semaglutide-Weight Management; Inject 1.7 mg into the skin once a week.  Dispense: 3 mL; Refill: 0 -     Semaglutide-Weight Management; Inject 2.4 mg into the skin once a week.  Dispense: 3 mL; Refill: 3  Chronic idiopathic constipation  Continue Linzess   Return in about  4 months (around 09/05/2023) for meds recheck.    Moshe Cipro, FNP

## 2023-05-07 NOTE — Patient Instructions (Signed)
I have sent in semaglutide 1.7 mg for you to take once a week for the next month.  I have also sent in the next month's dose of 2.4 mg of semaglutide for you to take once a week.  I did put refills on the 2.4 mg dosage.  Continue Linzess.  Follow-up with me in about 4 months for medication management, sooner if needed

## 2023-05-12 DIAGNOSIS — Z419 Encounter for procedure for purposes other than remedying health state, unspecified: Secondary | ICD-10-CM | POA: Diagnosis not present

## 2023-06-04 ENCOUNTER — Other Ambulatory Visit (HOSPITAL_COMMUNITY): Payer: Self-pay

## 2023-06-07 ENCOUNTER — Ambulatory Visit: Payer: Medicaid Other | Admitting: Dermatology

## 2023-06-12 DIAGNOSIS — Z419 Encounter for procedure for purposes other than remedying health state, unspecified: Secondary | ICD-10-CM | POA: Diagnosis not present

## 2023-07-02 ENCOUNTER — Other Ambulatory Visit (HOSPITAL_COMMUNITY): Payer: Self-pay

## 2023-07-10 DIAGNOSIS — Z419 Encounter for procedure for purposes other than remedying health state, unspecified: Secondary | ICD-10-CM | POA: Diagnosis not present

## 2023-07-21 DIAGNOSIS — G8929 Other chronic pain: Secondary | ICD-10-CM | POA: Diagnosis not present

## 2023-07-21 DIAGNOSIS — M549 Dorsalgia, unspecified: Secondary | ICD-10-CM | POA: Diagnosis not present

## 2023-07-21 DIAGNOSIS — Z79899 Other long term (current) drug therapy: Secondary | ICD-10-CM | POA: Diagnosis not present

## 2023-08-07 ENCOUNTER — Other Ambulatory Visit (HOSPITAL_BASED_OUTPATIENT_CLINIC_OR_DEPARTMENT_OTHER): Payer: Self-pay

## 2023-08-07 ENCOUNTER — Encounter: Payer: Self-pay | Admitting: Nurse Practitioner

## 2023-08-07 ENCOUNTER — Other Ambulatory Visit (HOSPITAL_COMMUNITY): Payer: Self-pay

## 2023-08-09 ENCOUNTER — Other Ambulatory Visit (HOSPITAL_COMMUNITY): Payer: Self-pay

## 2023-08-10 ENCOUNTER — Other Ambulatory Visit (HOSPITAL_COMMUNITY): Payer: Self-pay

## 2023-08-12 ENCOUNTER — Other Ambulatory Visit (HOSPITAL_COMMUNITY): Payer: Self-pay

## 2023-08-12 ENCOUNTER — Telehealth: Payer: Self-pay | Admitting: Pharmacy Technician

## 2023-08-12 NOTE — Telephone Encounter (Signed)
 Copied from CRM [id number]. Topic: General - Call Back - No Documentation >> Aug 12, 2023  1:46 PM Pascal Lux wrote: Reason for CRM: Patient returning call from [place]. Called [facility] and was advised currently busy can take patient weight. Patient stated current weight is 175.9.

## 2023-08-12 NOTE — Telephone Encounter (Signed)
 PA request can not be submitted until we have pertinent information. New Encounter has been or will be created for follow up. For additional info see Pharmacy Prior Auth telephone encounter from 08/12/23.

## 2023-08-12 NOTE — Telephone Encounter (Signed)
 Left voice mail for pt to call back to provide their weight.

## 2023-08-12 NOTE — Telephone Encounter (Signed)
 Prior Authorization form/request asks a question that requires your assistance. Please see the question below and advise accordingly. The PA will not be submitted until the necessary information is received.    Please answer these questions as soon as possible so we can submit her PA.  Thanks!!

## 2023-08-16 ENCOUNTER — Other Ambulatory Visit (HOSPITAL_COMMUNITY): Payer: Self-pay

## 2023-08-16 ENCOUNTER — Telehealth: Payer: Self-pay

## 2023-08-16 NOTE — Telephone Encounter (Signed)
 Pharmacy Patient Advocate Encounter  Received notification from [facility] [employer] that Prior Authorization for [facility] 2.4mg /0.31ml has been APPROVED from 08/16/23 to 08/15/24   PA #/Case ID/Reference #: [id number]

## 2023-08-16 NOTE — Telephone Encounter (Signed)
 Pharmacy Patient Advocate Encounter   Received notification from Pt Calls Messages that prior authorization for [facility] 2.4mg /0.14ml is required/requested.   Insurance verification completed.   The patient is insured through [facility] [place] .   Per test claim: PA required; PA submitted to above mentioned insurance via CoverMyMeds Key/confirmation #/EOC [facility] Status is pending   Resubmitted the PA due to the original PA in [facility] had expired.

## 2023-08-20 ENCOUNTER — Other Ambulatory Visit (HOSPITAL_COMMUNITY): Payer: Self-pay

## 2023-08-21 DIAGNOSIS — Z419 Encounter for procedure for purposes other than remedying health state, unspecified: Secondary | ICD-10-CM | POA: Diagnosis not present

## 2023-08-22 DIAGNOSIS — M419 Scoliosis, unspecified: Secondary | ICD-10-CM | POA: Diagnosis not present

## 2023-08-22 DIAGNOSIS — M549 Dorsalgia, unspecified: Secondary | ICD-10-CM | POA: Diagnosis not present

## 2023-08-22 DIAGNOSIS — Z79899 Other long term (current) drug therapy: Secondary | ICD-10-CM | POA: Diagnosis not present

## 2023-08-22 DIAGNOSIS — Z6831 Body mass index (BMI) 31.0-31.9, adult: Secondary | ICD-10-CM | POA: Diagnosis not present

## 2023-09-01 NOTE — Telephone Encounter (Signed)
 PA is completed in a different encounter

## 2023-09-08 ENCOUNTER — Telehealth: Admitting: Physician Assistant

## 2023-09-08 DIAGNOSIS — B3731 Acute candidiasis of vulva and vagina: Secondary | ICD-10-CM

## 2023-09-08 MED ORDER — FLUCONAZOLE 150 MG PO TABS: ORAL_TABLET | ORAL | 0 refills | Status: DC

## 2023-09-08 NOTE — Progress Notes (Signed)

## 2023-09-08 NOTE — Progress Notes (Signed)
 I have spent 5 minutes in review of e-visit questionnaire, review and updating patient chart, medical decision making and response to patient.   Piedad Climes, PA-C

## 2023-09-09 ENCOUNTER — Ambulatory Visit: Payer: Medicaid Other | Admitting: Nurse Practitioner

## 2023-09-13 ENCOUNTER — Ambulatory Visit: Admission: EM | Admit: 2023-09-13 | Discharge: 2023-09-13 | Disposition: A | Discharge status: Home / Self Care

## 2023-09-13 DIAGNOSIS — J209 Acute bronchitis, unspecified: Secondary | ICD-10-CM

## 2023-09-13 MED ORDER — PROMETHAZINE-DM 6.25-15 MG/5ML PO SYRP: 5.0000 mL | ORAL_SOLUTION | Freq: Four times a day (QID) | ORAL | 0 refills | Status: DC | PRN

## 2023-09-13 MED ORDER — PREDNISONE 20 MG PO TABS: 40.0000 mg | ORAL_TABLET | Freq: Every day | ORAL | 0 refills | Status: AC

## 2023-09-13 NOTE — ED Triage Notes (Signed)
"  This started with congestion about a week ago, lasting a day or two and a cough since, Negative COVID19 test at home". "The cough causes me to not sleep". No fever. No history of RAD.

## 2023-09-13 NOTE — ED Provider Notes (Signed)
 [facility]    CSN: [id number] Arrival date & time: 09/13/23  1837      History   Chief Complaint Chief Complaint  Patient presents with   Cough    HPI Anita Allen is a 33 y.o. female.   Patient here today for evaluation of congestion start about a week ago.  She has continued to have cough that seems to be worse at night.  Cough is occasionally productive.  She has not had any fever.  She has tried multiple over-the-counter medications without resolution.  The history is provided by the patient.    Past Medical History:  Diagnosis Date   BV (bacterial vaginosis)    Gestational diabetes    Gestational diabetes mellitus (GDM), antepartum 07/09/2022   [facility] Guidelines for Antenatal Testing and Sonography                                                         Updated  03/03/2022 with Dr. Cassandria Clever           INDICATION    Growth U/S    BPP weekly    DELIVERY RECOMMENDATION (GA)      Diabetes    A1 - good control        A2 - good control        A2  - poor control or poor compliance     (Macrosomia or polyhydramnios)        A2/B and B-C Prege   Gonorrhea contact, treated    H/O dilation and curettage    Headache    Hx of trichomoniasis    Molar pregnancy 12/2013   Ovarian cyst    PCOS (polycystic ovarian syndrome)    Severe pre-eclampsia 09/01/2022   UTI (urinary tract infection)    Vaginal delivery 2012, 2013   Yeast infection     Patient Active Problem List   Diagnosis Date Noted   Elevated alkaline phosphatase level 02/26/2023   Class 2 obesity without serious comorbidity with body mass index (BMI) of 35.0 to 35.9 in adult 01/15/2023   LGSIL on Pap smear of cervix 06/23/2021   PCOS (polycystic ovarian syndrome) 06/19/2021    Past Surgical History:  Procedure Laterality Date   brazillian butt lift     DILATION AND CURETTAGE OF UTERUS     DILATION AND EVACUATION N/A 12/20/2013   Procedure: DILATATION AND EVACUATION;  Surgeon: Tresia Fruit,  MD;  Location: [facility];  Service: Gynecology;  Laterality: N/A;   LIPOSUCTION TRUNK  2021    OB History     Gravida  7   Para  4   Term  4   Preterm  0   AB  3   Living  4      SAB  1   IAB  2   Ectopic  0   Multiple  0   Live Births  4            Home Medications    Prior to Admission medications   Medication Sig Start Date End Date Taking? Authorizing Provider  Oxycodone  HCl 10 MG TABS Take 10 mg by mouth 4 (four) times daily as needed. 07/21/23  Yes [provider]  predniSONE (DELTASONE) 20 MG tablet Take 2 tablets (40 mg total) by  mouth daily with breakfast for 5 days. 09/13/23 09/18/23 Yes Vernestine Gondola, PA-C  promethazine -dextromethorphan (PROMETHAZINE -DM) 6.25-15 MG/5ML syrup Take 5 mLs by mouth 4 (four) times daily as needed for cough. 09/13/23  Yes Vernestine Gondola, PA-C  clindamycin  (CLEOCIN -T) 1 % lotion Apply topically daily. 03/04/23 03/03/24  Dellar Fenton, DO  fluconazole  (DIFLUCAN ) 150 MG tablet Take 1 tablet PO once. Repeat in 3 days if needed. 09/08/23   Farris Hong, PA-C  linaclotide  (LINZESS ) 145 MCG CAPS capsule Take 1 capsule (145 mcg total) by mouth daily before breakfast. 04/06/23 10/03/23  Wellington Half, FNP  oxyCODONE -acetaminophen  (PERCOCET) 10-325 MG tablet Take 1 tablet by mouth 2 (two) times daily as needed. 03/04/23   [provider]  Prenatal MV-Min-Fe Fum-FA-DHA (PRENATAL 1 PO) Take by mouth.    [provider]  RETIN-A  0.025 % cream Apply topically at bedtime. Apply on M-W-F 03/04/23   Dellar Fenton, DO  Semaglutide -Weight Management 2.4 MG/0.75ML SOAJ Inject 2.4 mg into the skin once a week. 05/31/23   Wellington Half, FNP  Vitamin D, Ergocalciferol, (DRISDOL) 1.25 MG (50000 UNIT) CAPS capsule Take 50,000 Units by mouth once a week. 02/01/23   [provider]    Family History Family History  Problem Relation Age of Onset   Hypertension Mother    Heart attack Mother     Thyroid  disease Mother    Heart disease Father    Hypertension Father    Heart attack Father     Social History Social History   Tobacco Use   Smoking status: Never    Passive exposure: Current   Smokeless tobacco: Never   Tobacco comments:    Boyfriend, In an outside.  Vaping Use   Vaping status: Never Used  Substance Use Topics   Alcohol use: Not Currently    Comment: not often, not since confirmed pregnancy   Drug use: No     Allergies   Augmentin [amoxicillin-pot clavulanate] and Latex   Review of Systems Review of Systems  Constitutional:  Negative for chills and fever.  HENT:  Positive for congestion and sore throat (from cough). Negative for ear pain and sinus pressure.   Eyes:  Negative for discharge and redness.  Respiratory:  Positive for cough and wheezing (mild). Negative for shortness of breath.   Gastrointestinal:  Negative for abdominal pain, diarrhea, nausea and vomiting.     Physical Exam Triage Vital Signs ED Triage Vitals  Encounter Vitals Group     BP      Systolic BP Percentile      Diastolic BP Percentile      Pulse      Resp      Temp      Temp src      SpO2      Weight      Height      Head Circumference      Peak Flow      Pain Score      Pain Loc      Pain Education      Exclude from Growth Chart    No data found.  Updated Vital Signs BP 120/81 (BP Location: Left Arm)   Pulse 80   Temp 98.6 F (37 C) (Oral)   Resp 18   Ht 5\' 1"  (1.549 m)   Wt 174 lb (78.9 kg)   LMP  (LMP Unknown) Comment: Recent termination of pregnancy.  SpO2 97%   Breastfeeding No  BMI 32.88 kg/m   Visual Acuity Right Eye Distance:   Left Eye Distance:   Bilateral Distance:    Right Eye Near:   Left Eye Near:    Bilateral Near:     Physical Exam Vitals and nursing note reviewed.  Constitutional:      General: She is not in acute distress.    Appearance: Normal appearance. She is not ill-appearing.  HENT:     Head: Normocephalic and  atraumatic.     Nose: Congestion present.     Mouth/Throat:     Mouth: Mucous membranes are moist.     Pharynx: No oropharyngeal exudate or posterior oropharyngeal erythema.  Eyes:     Conjunctiva/sclera: Conjunctivae normal.  Cardiovascular:     Rate and Rhythm: Normal rate and regular rhythm.     Heart sounds: Normal heart sounds. No murmur heard. Pulmonary:     Effort: Pulmonary effort is normal. No respiratory distress.     Breath sounds: Normal breath sounds. No wheezing, rhonchi or rales.  Skin:    General: Skin is warm and dry.  Neurological:     Mental Status: She is alert.  Psychiatric:        Mood and Affect: Mood normal.        Thought Content: Thought content normal.      UC Treatments / Results  Labs (all labs ordered are listed, but only abnormal results are displayed) Labs Reviewed - No data to display  EKG   Radiology No results found.  Procedures Procedures (including critical care time)  Medications Ordered in UC Medications - No data to display  Initial Impression / Assessment and Plan / UC Course  I have reviewed the triage vital signs and the nursing notes.  Pertinent labs & imaging results that were available during my care of the patient were reviewed by me and considered in my medical decision making (see chart for details).    Suspect likely bronchitis.  Will treat with steroid burst as well as cough syrup. Advised cough syrup may cause drowsiness. Encouraged follow-up if no gradual improvement or with any further concerns.  Patient expresses understanding.  Final Clinical Impressions(s) / UC Diagnoses   Final diagnoses:  Acute bronchitis, unspecified organism   Discharge Instructions   None    ED Prescriptions     Medication Sig Dispense Auth. Provider   predniSONE (DELTASONE) 20 MG tablet Take 2 tablets (40 mg total) by mouth daily with breakfast for 5 days. 10 tablet Jami Mcclintock F, PA-C   promethazine -dextromethorphan  (PROMETHAZINE -DM) 6.25-15 MG/5ML syrup Take 5 mLs by mouth 4 (four) times daily as needed for cough. 118 mL Vernestine Gondola, PA-C      PDMP not reviewed this encounter.   Vernestine Gondola, PA-C 09/13/23 [phone]

## 2023-09-17 ENCOUNTER — Telehealth: Admitting: Physician Assistant

## 2023-09-17 DIAGNOSIS — B9689 Other specified bacterial agents as the cause of diseases classified elsewhere: Secondary | ICD-10-CM | POA: Diagnosis not present

## 2023-09-17 DIAGNOSIS — N760 Acute vaginitis: Secondary | ICD-10-CM | POA: Diagnosis not present

## 2023-09-17 MED ORDER — METRONIDAZOLE 500 MG PO TABS: 500.0000 mg | ORAL_TABLET | Freq: Two times a day (BID) | ORAL | 0 refills | Status: AC

## 2023-09-17 NOTE — Progress Notes (Signed)

## 2023-09-20 DIAGNOSIS — Z419 Encounter for procedure for purposes other than remedying health state, unspecified: Secondary | ICD-10-CM | POA: Diagnosis not present

## 2023-10-11 ENCOUNTER — Telehealth: Admitting: Physician Assistant

## 2023-10-11 ENCOUNTER — Other Ambulatory Visit (HOSPITAL_COMMUNITY): Payer: Self-pay

## 2023-10-11 DIAGNOSIS — B3731 Acute candidiasis of vulva and vagina: Secondary | ICD-10-CM

## 2023-10-11 MED ORDER — FLUCONAZOLE 150 MG PO TABS
150.0000 mg | ORAL_TABLET | ORAL | 0 refills | Status: DC | PRN
  Filled 2023-10-11: qty 2, 6d supply, fill #0

## 2023-10-11 NOTE — Progress Notes (Signed)

## 2023-10-20 ENCOUNTER — Telehealth: Payer: Self-pay | Admitting: Nurse Practitioner

## 2023-10-20 NOTE — Telephone Encounter (Signed)
 Copied from CRM [id number]. Topic: Clinical - Medication Question >> Oct 20, 2023  1:26 PM Clyde Darling P wrote: Reason for CRM: pt need medication for nausea - been vomitting for last couple of days / has not been able to eat on Semaglutide -Weight Management 2.4 MG/0.75ML SOAJ  [phone]

## 2023-10-21 ENCOUNTER — Other Ambulatory Visit: Payer: Self-pay | Admitting: Family

## 2023-10-21 DIAGNOSIS — Z419 Encounter for procedure for purposes other than remedying health state, unspecified: Secondary | ICD-10-CM | POA: Diagnosis not present

## 2023-10-21 MED ORDER — ONDANSETRON HCL 4 MG PO TABS: 4.0000 mg | ORAL_TABLET | Freq: Three times a day (TID) | ORAL | 0 refills | Status: DC | PRN

## 2023-10-23 DIAGNOSIS — Z79899 Other long term (current) drug therapy: Secondary | ICD-10-CM | POA: Diagnosis not present

## 2023-10-23 DIAGNOSIS — Z6831 Body mass index (BMI) 31.0-31.9, adult: Secondary | ICD-10-CM | POA: Diagnosis not present

## 2023-10-23 DIAGNOSIS — M419 Scoliosis, unspecified: Secondary | ICD-10-CM | POA: Diagnosis not present

## 2023-10-23 DIAGNOSIS — M549 Dorsalgia, unspecified: Secondary | ICD-10-CM | POA: Diagnosis not present

## 2023-10-23 DIAGNOSIS — Z8742 Personal history of other diseases of the female genital tract: Secondary | ICD-10-CM | POA: Diagnosis not present

## 2023-10-23 DIAGNOSIS — R0789 Other chest pain: Secondary | ICD-10-CM | POA: Diagnosis not present

## 2023-10-27 ENCOUNTER — Inpatient Hospital Stay (HOSPITAL_COMMUNITY)

## 2023-10-27 ENCOUNTER — Encounter (HOSPITAL_COMMUNITY): Payer: Self-pay | Admitting: *Deleted

## 2023-10-27 ENCOUNTER — Inpatient Hospital Stay (HOSPITAL_COMMUNITY)
Admission: AD | Admit: 2023-10-27 | Discharge: 2023-10-27 | Disposition: A | Discharge status: Home / Self Care | Attending: Obstetrics & Gynecology | Admitting: Obstetrics & Gynecology

## 2023-10-27 DIAGNOSIS — Z3A09 9 weeks gestation of pregnancy: Secondary | ICD-10-CM

## 2023-10-27 DIAGNOSIS — O208 Other hemorrhage in early pregnancy: Secondary | ICD-10-CM

## 2023-10-27 DIAGNOSIS — Z3201 Encounter for pregnancy test, result positive: Secondary | ICD-10-CM | POA: Diagnosis not present

## 2023-10-27 DIAGNOSIS — Z332 Encounter for elective termination of pregnancy: Secondary | ICD-10-CM | POA: Diagnosis not present

## 2023-10-27 HISTORY — DX: Unspecified abnormal cytological findings in specimens from vagina: R87.629

## 2023-10-27 LAB — CBC
HCT: 40.6 % (ref 36.0–46.0)
Hemoglobin: 13.3 g/dL (ref 12.0–15.0)
MCH: 27.7 pg (ref 26.0–34.0)
MCHC: 32.8 g/dL (ref 30.0–36.0)
MCV: 84.6 fL (ref 80.0–100.0)
Platelets: 373 10*3/uL (ref 150–400)
RBC: 4.8 MIL/uL (ref 3.87–5.11)
RDW: 14.2 % (ref 11.5–15.5)
WBC: 11.2 10*3/uL — ABNORMAL HIGH (ref 4.0–10.5)
nRBC: 0 % (ref 0.0–0.2)

## 2023-10-27 LAB — WET PREP, GENITAL
Clue Cells Wet Prep HPF POC: NONE SEEN
Sperm: NONE SEEN
Trich, Wet Prep: NONE SEEN
WBC, Wet Prep HPF POC: >=10 — AB (ref <10)
Yeast Wet Prep HPF POC: NONE SEEN

## 2023-10-27 LAB — POCT PREGNANCY, URINE: Preg Test, Ur: POSITIVE — AB

## 2023-10-27 LAB — HCG, QUANTITATIVE, PREGNANCY: hCG, Beta Chain, Quant, S: 60892 m[IU]/mL — ABNORMAL HIGH (ref <5)

## 2023-10-27 MED ORDER — PRENATAL PLUS 27-1 MG PO TABS: 1.0000 | ORAL_TABLET | Freq: Every day | ORAL | 0 refills | Status: DC

## 2023-10-27 NOTE — MAU Provider Note (Addendum)
 Symptomatic post medical abortion    [name] is a 33 y.o. [id number] pregnant female at Unknown who presents to MAU today with complaint of positive pregnancy test 6/16 after taking pills to induce medical abortion 5-6 weeks ago.  Pt states [facility] who she could not afford and they directed her to a website who sent her the pills. She states she took them vaginally and bleed for three days and passed what she thought was fetal tissue.  However, pt states she also is still spotting, unchanged since taking medication.  She also is still nauseated and fatigued.  She states she is also taking Wegovy  injections and isn't sure if cramping / bloating is from that but believes the nausea is separate as she has been on unchanged dose for months now and acutely worsened nausea. Denies F/C, foul smelling dc, unprotected sex since taking vaginal medication. She does not recall GA of when she took the pills and cannot remember her LMP.  Pt reports +preg test on 6/16. Pt states she is interested in having BTS as this will be her third medical abortion since delivery of her last child and she's hesitant to start hormonal birth control.  Is not currently established with OBGYN practice but use to see Dr. April Knack at [facility].  Pertinent items noted in HPI and remainder of comprehensive ROS otherwise negative.   O BP 119/71 (BP Location: Right Arm)   Pulse 86   Temp 98.5 F (36.9 C) (Oral)   Resp 16   Ht 5' 2.5 (1.588 m)   Wt 82.8 kg   LMP  (LMP Unknown) Comment: unknown gest,  took abortion pills 5-6 wks ago, spotting since  SpO2 100%   BMI 32.85 kg/m  Physical Exam Vitals and nursing note reviewed.  Constitutional:      General: She is not in acute distress.    Appearance: Normal appearance. She is normal weight. She is not ill-appearing.  HENT:     Head: Normocephalic and atraumatic.     Right Ear: External ear normal.     Left Ear: External ear normal.     Nose: Nose  normal. No congestion.     Mouth/Throat:     Mouth: Mucous membranes are moist.     Pharynx: Oropharynx is clear.   Eyes:     Extraocular Movements: Extraocular movements intact.     Conjunctiva/sclera: Conjunctivae normal.    Cardiovascular:     Rate and Rhythm: Normal rate.  Pulmonary:     Effort: Pulmonary effort is normal. No respiratory distress.  Abdominal:     General: Abdomen is flat. There is no distension.     Palpations: Abdomen is soft.     Tenderness: There is no abdominal tenderness.   Musculoskeletal:        General: No swelling. Normal range of motion.     Cervical back: Normal range of motion.   Skin:    General: Skin is warm and dry.   Neurological:     Mental Status: She is alert and oriented to person, place, and time. Mental status is at baseline.     Motor: No weakness.     Gait: Gait normal.   Psychiatric:        Mood and Affect: Mood normal.        Behavior: Behavior normal.      MDM: MAU Course:  Pt is sure that she took vaginal medication to induce abortion 5  weeks ago.  She was never told IUP.  [facility] at [facility]. She believes she passed fetal tissue in first 3 days of bleeding but has continued having spotting, abdominal cramping and now worsening nausea in s/o +preg test 6/16.  Upreg here positive as well.  Will proceed with pregnancy of unknown location work up, patient agreeable.   GC collected Wet prep negative CBC WBC 11.2, Hgb 13.3 hCG collected  US  = [redacted]w[redacted]d +/- 5 days with fetal cardiac activity, small SCH  Pt desires to terminate pregnancy and understands those services are not offered by [facility] but pt familiar with [facility] and will thusly be given a letter confirming IUP.  Pt requested to send PNV in case she changed her mind or unable to get in with [facility].  Pt d/c in stable condition.   AP #[redacted] weeks gestation [facility] - sent PNV in case pt continues with pregnancy - list  of providers and OTC meds safe in pregnancy #Desired infertility - pt desires BTS and desires to follow up with OBGYN to be counseled about having BTS, sent message to [facility] for scheduling   Discharge from MAU in stable condition with strict/usual precautions Follow up at desired OBGYN as scheduled for ongoing prenatal care  Allergies as of 10/27/2023       Reactions   Augmentin [amoxicillin-pot Clavulanate] Hives, Other (See Comments)   Pt has taken Ceftriaxone  in the past (??)   Latex Rash   Vaginal irritation with condoms        Medication List     TAKE these medications    clindamycin  1 % lotion Commonly known as: Cleocin -T Apply topically daily.   fluconazole  150 MG tablet Commonly known as: DIFLUCAN  Take 1 tablet (150 mg total) by mouth every 3 (three) days as needed.   linaclotide  145 MCG Caps capsule Commonly known as: LINZESS  Take 1 capsule (145 mcg total) by mouth daily before breakfast.   ondansetron  4 MG tablet Commonly known as: Zofran  Take 1 tablet (4 mg total) by mouth every 8 (eight) hours as needed for nausea or vomiting.   Oxycodone  HCl 10 MG Tabs Take 10 mg by mouth 4 (four) times daily as needed.   oxyCODONE -acetaminophen  10-325 MG tablet Commonly known as: PERCOCET Take 1 tablet by mouth 2 (two) times daily as needed.   PRENATAL 1 PO Take by mouth.   prenatal vitamin w/FE, FA 27-1 MG Tabs tablet Take 1 tablet by mouth daily at 12 noon.   Retin-A  0.025 % cream Generic drug: tretinoin  Apply topically at bedtime. Apply on M-W-F   Vitamin D (Ergocalciferol) 1.25 MG (50000 UNIT) Caps capsule Commonly known as: DRISDOL Take 50,000 Units by mouth once a week.   Wegovy  2.4 MG/0.75ML Soaj Generic drug: Semaglutide -Weight Management Inject 2.4 mg into the skin once a week.        Anita Goldstein, MD 10/27/2023 2:53 PM

## 2023-10-27 NOTE — MAU Note (Addendum)
 [name] is a 34 y.o. at Unknown here in MAU reporting: had medical abortion(pills), about 5wks ago.  Still spotting, not  had a period.  Still had positive test on Mon 6/16. No fever no abd pain.  Still really nauseated and fatigued. LMP: unknown, unknown gest when she took the pills. Onset of complaint: ongoing Pain score: none Vitals:   10/27/23 1228  BP: 119/71  Pulse: 86  Resp: 16  Temp: 98.5 F (36.9 C)  SpO2: 100%      Lab orders placed from triage:  UPT

## 2023-10-28 LAB — GC/CHLAMYDIA PROBE AMP (~~LOC~~) NOT AT ARMC
Chlamydia: NEGATIVE
Comment: NEGATIVE
Comment: NORMAL
Neisseria Gonorrhea: NEGATIVE

## 2023-11-09 ENCOUNTER — Other Ambulatory Visit (HOSPITAL_COMMUNITY): Payer: Self-pay

## 2023-11-15 ENCOUNTER — Ambulatory Visit: Admitting: Obstetrics and Gynecology

## 2023-11-20 DIAGNOSIS — Z419 Encounter for procedure for purposes other than remedying health state, unspecified: Secondary | ICD-10-CM | POA: Diagnosis not present

## 2023-12-06 DIAGNOSIS — Z79899 Other long term (current) drug therapy: Secondary | ICD-10-CM | POA: Diagnosis not present

## 2023-12-06 DIAGNOSIS — M129 Arthropathy, unspecified: Secondary | ICD-10-CM | POA: Diagnosis not present

## 2023-12-06 DIAGNOSIS — M549 Dorsalgia, unspecified: Secondary | ICD-10-CM | POA: Diagnosis not present

## 2023-12-06 DIAGNOSIS — Z8742 Personal history of other diseases of the female genital tract: Secondary | ICD-10-CM | POA: Diagnosis not present

## 2023-12-06 DIAGNOSIS — M419 Scoliosis, unspecified: Secondary | ICD-10-CM | POA: Diagnosis not present

## 2023-12-06 DIAGNOSIS — Z6831 Body mass index (BMI) 31.0-31.9, adult: Secondary | ICD-10-CM | POA: Diagnosis not present

## 2023-12-06 DIAGNOSIS — G8929 Other chronic pain: Secondary | ICD-10-CM | POA: Diagnosis not present

## 2023-12-06 DIAGNOSIS — M4836 Traumatic spondylopathy, lumbar region: Secondary | ICD-10-CM | POA: Diagnosis not present

## 2023-12-06 DIAGNOSIS — D539 Nutritional anemia, unspecified: Secondary | ICD-10-CM | POA: Diagnosis not present

## 2023-12-07 ENCOUNTER — Other Ambulatory Visit (HOSPITAL_COMMUNITY): Payer: Self-pay

## 2023-12-07 DIAGNOSIS — Z79899 Other long term (current) drug therapy: Secondary | ICD-10-CM | POA: Diagnosis not present

## 2023-12-13 DIAGNOSIS — E66811 Obesity, class 1: Secondary | ICD-10-CM | POA: Diagnosis not present

## 2023-12-13 DIAGNOSIS — Z713 Dietary counseling and surveillance: Secondary | ICD-10-CM | POA: Diagnosis not present

## 2023-12-13 DIAGNOSIS — Z6830 Body mass index (BMI) 30.0-30.9, adult: Secondary | ICD-10-CM | POA: Diagnosis not present

## 2023-12-13 DIAGNOSIS — Z7182 Exercise counseling: Secondary | ICD-10-CM | POA: Diagnosis not present

## 2023-12-14 ENCOUNTER — Other Ambulatory Visit (HOSPITAL_COMMUNITY): Payer: Self-pay

## 2023-12-14 MED ORDER — TIRZEPATIDE-WEIGHT MANAGEMENT 5 MG/0.5ML ~~LOC~~ SOAJ
5.0000 mg | SUBCUTANEOUS | 0 refills | Status: DC
  Filled 2023-12-14: qty 2, 28d supply, fill #0

## 2023-12-15 ENCOUNTER — Other Ambulatory Visit (HOSPITAL_COMMUNITY): Payer: Self-pay

## 2023-12-15 ENCOUNTER — Telehealth: Admitting: Family Medicine

## 2023-12-15 DIAGNOSIS — N898 Other specified noninflammatory disorders of vagina: Secondary | ICD-10-CM

## 2023-12-15 NOTE — Progress Notes (Signed)
  Because given the recent pregnancy, we need to make sure you cleared the tissue and swab you for proper treatment needs. We feel your condition warrants further evaluation and recommend that you be seen in a face-to-face visit at your PCP, GYN and or local urgent care in person.   NOTE: There will be NO CHARGE for this E-Visit   If you are having a true medical emergency, please call 911.

## 2023-12-21 DIAGNOSIS — Z419 Encounter for procedure for purposes other than remedying health state, unspecified: Secondary | ICD-10-CM | POA: Diagnosis not present

## 2023-12-22 ENCOUNTER — Ambulatory Visit: Payer: Self-pay | Admitting: *Deleted

## 2023-12-22 NOTE — Telephone Encounter (Signed)
 FYI Only or Action Required?: FYI only for provider.  Patient was last seen in primary care on 05/07/2023 by Alvia Corean CROME, FNP.  Called Nurse Triage reporting Vaginal Bleeding.  Symptoms began about a month ago.  Interventions attempted: Nothing.  Symptoms are: unchanged.  Triage Disposition: See PCP When Office is Open (Within 3 Days)  Patient/caregiver understands and will follow disposition?: Yes               Copied from CRM #[id number]. Topic: Clinical - Red Word Triage >> Dec 22, 2023  2:23 PM Deleta RAMAN wrote: Red Word that prompted transfer to Nurse Triage: patient had a pregnancy termination at the end of June aprox 6/28. She is still bleeding as of now. Reason for Disposition  [1] Bleeding or spotting after procedure (e.g., biopsy) or pelvic examination (e.g., pap smear) AND [2] lasts > 7 days  Answer Assessment - Initial Assessment Questions Patient unable to be seen today . No available appt with PCP until Sept. Scheduled appt with other provider 12/24/23 when off of work.     1. BLEEDING SEVERITY: Describe the bleeding that you are having. How much bleeding is there?      Not severe but continues to bleed after termination of pregnancy 11/06/23 2. ONSET: When did the bleeding begin? Is it continuing now?     11/06/23 conti 3. MENSTRUAL PERIOD: When was the last normal menstrual period? How is this different than your period?     Continued bleeding has not stopped since 11/06/23 4. REGULARITY: How regular are your periods?     Na  5. ABDOMEN PAIN: Do you have any pain? How bad is the pain?  (e.g., Scale 0-10; none, mild, moderate, or severe)     No  6. PREGNANCY: Is there any chance you are pregnant? When was your last menstrual period?     Recent abortion approx 11/06/23 7. BREASTFEEDING: Are you breastfeeding?     na 8. HORMONE MEDICINES: Are you taking any hormone medicines, prescription or over-the-counter? (e.g., birth  control pills, estrogen)     No  9. BLOOD THINNER MEDICINES: Do you take any blood thinners? (e.g., Coumadin / warfarin, Pradaxa / dabigatran, aspirin )     Na  10. CAUSE: What do you think is causing the bleeding? (e.g., recent gyn surgery, recent gyn procedure; known bleeding disorder, cervical cancer, polycystic ovarian disease, fibroids)         Termination of pregnancy 11/06/23 11. HEMODYNAMIC STATUS: Are you weak or feeling lightheaded? If Yes, ask: Can you stand and walk normally?        Denies  12. OTHER SYMPTOMS: What other symptoms are you having with the bleeding? (e.g., passed tissue, vaginal discharge, fever, menstrual-type cramps)       Passed tissue 11/06/23. Vaginal bleeding continues not severe and no clotting. Just has not stopped  Protocols used: Vaginal Bleeding - Abnormal-A-AH

## 2023-12-22 NOTE — Telephone Encounter (Signed)
 Called patient to reschedule appt with different provider. Scheduled appt with Anita Ku, NP for 12/24/23. Patient verbalized understanding.

## 2023-12-24 ENCOUNTER — Ambulatory Visit: Payer: Self-pay | Admitting: Family Medicine

## 2023-12-24 ENCOUNTER — Ambulatory Visit: Admitting: Internal Medicine

## 2023-12-24 ENCOUNTER — Encounter: Payer: Self-pay | Admitting: Family Medicine

## 2023-12-24 ENCOUNTER — Ambulatory Visit (INDEPENDENT_AMBULATORY_CARE_PROVIDER_SITE_OTHER): Admitting: Family Medicine

## 2023-12-24 ENCOUNTER — Other Ambulatory Visit (HOSPITAL_COMMUNITY)
Admission: RE | Admit: 2023-12-24 | Discharge: 2023-12-24 | Disposition: A | Discharge status: Home / Self Care | Source: Ambulatory Visit | Attending: Family Medicine | Admitting: Family Medicine

## 2023-12-24 VITALS — BP 100/72 | HR 67 | Temp 97.6°F | Ht 62.5 in | Wt 170.0 lb

## 2023-12-24 DIAGNOSIS — N83201 Unspecified ovarian cyst, right side: Secondary | ICD-10-CM

## 2023-12-24 DIAGNOSIS — Z6830 Body mass index (BMI) 30.0-30.9, adult: Secondary | ICD-10-CM | POA: Diagnosis not present

## 2023-12-24 DIAGNOSIS — Z113 Encounter for screening for infections with a predominantly sexual mode of transmission: Secondary | ICD-10-CM | POA: Diagnosis not present

## 2023-12-24 DIAGNOSIS — N939 Abnormal uterine and vaginal bleeding, unspecified: Secondary | ICD-10-CM | POA: Insufficient documentation

## 2023-12-24 DIAGNOSIS — Z9889 Other specified postprocedural states: Secondary | ICD-10-CM | POA: Diagnosis not present

## 2023-12-24 LAB — CBC WITH DIFFERENTIAL/PLATELET
Basophils Absolute: 0.1 K/uL (ref 0.0–0.1)
Basophils Relative: 1.7 % (ref 0.0–3.0)
Eosinophils Absolute: 0.1 K/uL (ref 0.0–0.7)
Eosinophils Relative: 1.5 % (ref 0.0–5.0)
HCT: 42.4 % (ref 36.0–46.0)
Hemoglobin: 13.7 g/dL (ref 12.0–15.0)
Lymphocytes Relative: 36.6 % (ref 12.0–46.0)
Lymphs Abs: 1.9 K/uL (ref 0.7–4.0)
MCHC: 32.3 g/dL (ref 30.0–36.0)
MCV: 83.9 fl (ref 78.0–100.0)
Monocytes Absolute: 0.5 K/uL (ref 0.1–1.0)
Monocytes Relative: 9 % (ref 3.0–12.0)
Neutro Abs: 2.7 K/uL (ref 1.4–7.7)
Neutrophils Relative %: 51.2 % (ref 43.0–77.0)
Platelets: 378 K/uL (ref 150.0–400.0)
RBC: 5.05 Mil/uL (ref 3.87–5.11)
RDW: 14.7 % (ref 11.5–15.5)
WBC: 5.2 K/uL (ref 4.0–10.5)

## 2023-12-24 LAB — COMPREHENSIVE METABOLIC PANEL WITH GFR
ALT: 8 U/L (ref 0–35)
AST: 13 U/L (ref 0–37)
Albumin: 4 g/dL (ref 3.5–5.2)
Alkaline Phosphatase: 97 U/L (ref 39–117)
BUN: 9 mg/dL (ref 6–23)
CO2: 25 meq/L (ref 19–32)
Calcium: 9.6 mg/dL (ref 8.4–10.5)
Chloride: 106 meq/L (ref 96–112)
Creatinine, Ser: 0.68 mg/dL (ref 0.40–1.20)
GFR: 114.45 mL/min (ref >60.00)
Glucose, Bld: 77 mg/dL (ref 70–99)
Potassium: 3.9 meq/L (ref 3.5–5.1)
Sodium: 136 meq/L (ref 135–145)
Total Bilirubin: 0.8 mg/dL (ref 0.2–1.2)
Total Protein: 7.4 g/dL (ref 6.0–8.3)

## 2023-12-24 LAB — TSH: TSH: 0.47 u[IU]/mL (ref 0.35–5.50)

## 2023-12-24 LAB — HCG, SERUM, QUALITATIVE: Preg, Serum: NEGATIVE

## 2023-12-24 NOTE — Progress Notes (Signed)
 Acute Office Visit  Subjective:     Patient ID: Anita Allen, female    DOB: 17-Apr-1991, 32 y.o.   MRN: [id number]  Chief Complaint  Patient presents with   Vaginal Bleeding    Vaginal bleeding for 3 months, has not seen any provider for it. It a consistent bleeding with heavy and light flow    Vaginal Bleeding    Discussed the use of AI scribe software for clinical note transcription with the patient, who gave verbal consent to proceed.  History of Present Illness Anita Allen is a 33 year old female who presents with prolonged bleeding following a medication abortion.  Abnormal uterine bleeding - Prolonged vaginal bleeding since December 06, 2023, following medication abortion - Bleeding is continuous but not heavy enough to soak pads consecutively - No associated pain or cramping - No increased fatigue or dizziness  Post-medication abortion status - Medication abortion performed at home on December 06, 2023, after positive pregnancy test in late June or early July - Fetal passage occurred on December 06, 2023 - No follow-up pregnancy test performed since abortion - Confident that pregnancy was terminated  Gynecologic follow-up - No gynecologic evaluation since postpartum visit one year ago - Unable to schedule follow-up with gynecologist or nurse practitioner due to unavailability     Review of Systems  Genitourinary:  Positive for vaginal bleeding.   Per HPI      Objective:    BP 100/72   Pulse 67   Temp 97.6 F (36.4 C) (Temporal)   Ht 5' 2.5 (1.588 m)   Wt 170 lb (77.1 kg)   LMP  (LMP Unknown) Comment: unknown gest,  took abortion pills 5-6 wks ago, spotting since  SpO2 97%   BMI 30.60 kg/m    Physical Exam Vitals and nursing note reviewed.  Constitutional:      General: She is not in acute distress.    Appearance: Normal appearance.  HENT:     Head: Normocephalic and atraumatic.     Right Ear: External ear normal.     Left Ear: External ear  normal.     Nose: Nose normal.     Mouth/Throat:     Mouth: Mucous membranes are moist.     Pharynx: Oropharynx is clear.  Eyes:     Extraocular Movements: Extraocular movements intact.     Pupils: Pupils are equal, round, and reactive to light.  Cardiovascular:     Rate and Rhythm: Normal rate and regular rhythm.     Pulses: Normal pulses.     Heart sounds: Normal heart sounds.  Pulmonary:     Effort: Pulmonary effort is normal. No respiratory distress.     Breath sounds: Normal breath sounds. No wheezing, rhonchi or rales.  Abdominal:     General: There is no distension.     Palpations: Abdomen is soft. There is no mass.     Tenderness: There is no abdominal tenderness. There is no guarding or rebound.     Hernia: No hernia is present.  Musculoskeletal:        General: Normal range of motion.     Cervical back: Normal range of motion.     Right lower leg: No edema.     Left lower leg: No edema.  Lymphadenopathy:     Cervical: No cervical adenopathy.  Neurological:     General: No focal deficit present.     Mental Status: She is alert and oriented to person, place,  and time.  Psychiatric:        Mood and Affect: Mood normal.        Thought Content: Thought content normal.     No results found for any visits on 12/24/23.      Assessment & Plan:   Assessment and Plan Assessment & Plan Abnormal uterine bleeding after medication abortion Persistent bleeding post-medication abortion without heavy flow, pain, or systemic symptoms. Follow-up pregnancy test not performed. - Order outpatient ultrasound to assess for retained products of conception or abnormalities. - Perform STD testing as requested. - Provide referral to gynecology for further evaluation and management.     Orders Placed This Encounter  Procedures   US  PELVIC COMPLETE WITH TRANSVAGINAL    Standing Status:   Future    Expiration Date:   12/23/2024    Reason for Exam (SYMPTOM  OR DIAGNOSIS REQUIRED):    abnormal bleeding    Preferred imaging location?:   [facility] Ul[facility]ound   CBC with Differential/Platelet    Release to patient:   Immediate [1]   Comprehensive metabolic panel with GFR    Release to patient:   Immediate [1]   hCG, serum, qualitative    Standing Status:   Future    Expiration Date:   12/23/2024   TSH   Ambulatory referral to Obstetrics / Gynecology    Referral Priority:   Routine    Referral Type:   Consultation    Referral Reason:   Specialty Services Required    Requested Specialty:   Obstetrics and Gynecology    Number of Visits Requested:   1     No orders of the defined types were placed in this encounter.   Return if symptoms worsen or fail to improve.  Corean LITTIE Ku, FNP

## 2023-12-24 NOTE — Patient Instructions (Signed)
 We are checking labs today, will be in contact with any results that require further attention  I have placed a referral to gynecology for you for follow up. Someone should be reaching out to get you scheduled.   Follow-up with me for new or worsening symptoms.

## 2023-12-27 LAB — CERVICOVAGINAL ANCILLARY ONLY
Bacterial Vaginitis (gardnerella): POSITIVE — AB
Candida Glabrata: NEGATIVE
Candida Vaginitis: NEGATIVE
Chlamydia: NEGATIVE
Comment: NEGATIVE
Comment: NEGATIVE
Comment: NEGATIVE
Comment: NEGATIVE
Comment: NEGATIVE
Comment: NORMAL
Neisseria Gonorrhea: NEGATIVE
Trichomonas: NEGATIVE

## 2023-12-29 ENCOUNTER — Ambulatory Visit (HOSPITAL_COMMUNITY)
Admission: RE | Admit: 2023-12-29 | Discharge: 2023-12-29 | Disposition: A | Discharge status: Home / Self Care | Source: Ambulatory Visit | Attending: Family Medicine | Admitting: Family Medicine

## 2023-12-29 DIAGNOSIS — N939 Abnormal uterine and vaginal bleeding, unspecified: Secondary | ICD-10-CM | POA: Diagnosis not present

## 2023-12-29 DIAGNOSIS — N838 Other noninflammatory disorders of ovary, fallopian tube and broad ligament: Secondary | ICD-10-CM | POA: Diagnosis not present

## 2024-01-03 ENCOUNTER — Telehealth: Payer: Self-pay

## 2024-01-03 NOTE — Telephone Encounter (Signed)
 Copied from CRM #[id number]. Topic: Clinical - Lab/Test Results >> Jan 03, 2024  4:18 PM Sasha M wrote: Reason for CRM: pt called in for U/S results. She is requesting an update once results are in at [phone]

## 2024-01-04 ENCOUNTER — Ambulatory Visit: Payer: Self-pay

## 2024-01-04 NOTE — Telephone Encounter (Signed)
 Made patient aware that we are awaiting imaging results

## 2024-01-04 NOTE — Telephone Encounter (Signed)
 Spoke with [facility] radiology, will work on getting imaging read today

## 2024-01-04 NOTE — Telephone Encounter (Signed)
 Pt. Calling for U/Allen results.   Copied from CRM #[id number]. Topic: Clinical - Red Word Triage >> Jan 04, 2024 12:09 PM Anita Allen wrote: Red Word that prompted transfer to Nurse Triage: Pt called in wanting to see if there was an update from the ultrasound she had done. Per pt chart there is no new updates. Pt stated she is having pain and bleeding from her vagina area since June. Warm transferred to nurse triage. Reason for Disposition  [1] Caller requests to speak ONLY to PCP AND [2] URGENT question  Protocols used: PCP Call - No Triage-A-AH

## 2024-01-04 NOTE — Telephone Encounter (Signed)
 Imaging still not read yet

## 2024-01-04 NOTE — Telephone Encounter (Signed)
 Seen Anita Allen 8/15

## 2024-01-06 ENCOUNTER — Ambulatory Visit
Admission: RE | Admit: 2024-01-06 | Discharge: 2024-01-06 | Disposition: A | Discharge status: Home / Self Care | Source: Ambulatory Visit | Attending: Family Medicine

## 2024-01-06 DIAGNOSIS — M4836 Traumatic spondylopathy, lumbar region: Secondary | ICD-10-CM | POA: Diagnosis not present

## 2024-01-06 DIAGNOSIS — N83201 Unspecified ovarian cyst, right side: Secondary | ICD-10-CM | POA: Insufficient documentation

## 2024-01-06 DIAGNOSIS — Z79899 Other long term (current) drug therapy: Secondary | ICD-10-CM | POA: Diagnosis not present

## 2024-01-06 DIAGNOSIS — M419 Scoliosis, unspecified: Secondary | ICD-10-CM | POA: Diagnosis not present

## 2024-01-06 DIAGNOSIS — N83291 Other ovarian cyst, right side: Secondary | ICD-10-CM | POA: Diagnosis not present

## 2024-01-06 DIAGNOSIS — Z6831 Body mass index (BMI) 31.0-31.9, adult: Secondary | ICD-10-CM | POA: Diagnosis not present

## 2024-01-06 DIAGNOSIS — Z8742 Personal history of other diseases of the female genital tract: Secondary | ICD-10-CM | POA: Diagnosis not present

## 2024-01-06 DIAGNOSIS — M549 Dorsalgia, unspecified: Secondary | ICD-10-CM | POA: Diagnosis not present

## 2024-01-06 NOTE — Telephone Encounter (Signed)
 Copied from CRM #[id number]. Topic: General - Other >> Jan 06, 2024 11:28 AM Tinnie C wrote: Reason for CRM: Pt calling asking why she has to do another ultrasound after having one on 8/20 and wants her results explained to her from the previous ultrasound. Ph: [phone]

## 2024-01-07 ENCOUNTER — Telehealth: Payer: Self-pay

## 2024-01-07 NOTE — Telephone Encounter (Signed)
 Called pt, pt stated she wanted to know what is casing her  Ovary to be enlarged. Also pt state had another ultrasound done again this week, 2 within one week.

## 2024-01-07 NOTE — Telephone Encounter (Signed)
 Patient saw Anita Allen on 8/15.  Copied from CRM [id number]. Topic: Clinical - Medication Question >> Jan 07, 2024  3:53 PM Anita Allen wrote: Reason for CRM: patient called in because she tested positive BV and was advised that medication was sent to the pharmacy. Medication was send 2 weeks ago and needs to be resent to the pharmacy because she didn't pick it up in time. Advised patient that as per office physician that called in script is not in today and will be back in Tuesday and she will have to resend on that day. Patient would also like a call back

## 2024-01-07 NOTE — Telephone Encounter (Signed)
 Called pt and left her voice mail to call us  back in regards to ultrasound

## 2024-01-10 ENCOUNTER — Encounter: Payer: Self-pay | Admitting: Physician Assistant

## 2024-01-10 ENCOUNTER — Telehealth: Admitting: Physician Assistant

## 2024-01-10 DIAGNOSIS — B9689 Other specified bacterial agents as the cause of diseases classified elsewhere: Secondary | ICD-10-CM

## 2024-01-10 DIAGNOSIS — N760 Acute vaginitis: Secondary | ICD-10-CM | POA: Diagnosis not present

## 2024-01-10 MED ORDER — METRONIDAZOLE 500 MG PO TABS: 500.0000 mg | ORAL_TABLET | Freq: Two times a day (BID) | ORAL | 0 refills | Status: AC

## 2024-01-10 NOTE — Progress Notes (Signed)
 E-Visit for Vaginal Symptoms  We are sorry that you are not feeling well. Here is how we plan to help! Based on what you shared with me it looks like you: May have a vaginosis due to bacteria  Vaginosis is an inflammation of the vagina that can result in discharge, itching and pain. The cause is usually a change in the normal balance of vaginal bacteria or an infection. Vaginosis can also result from reduced estrogen levels after menopause.  The most common causes of vaginosis are:   Bacterial vaginosis which results from an overgrowth of one on several organisms that are normally present in your vagina.   Yeast infections which are caused by a naturally occurring fungus called candida.   Vaginal atrophy (atrophic vaginosis) which results from the thinning of the vagina from reduced estrogen levels after menopause.   Trichomoniasis which is caused by a parasite and is commonly transmitted by sexual intercourse.  Factors that increase your risk of developing vaginosis include: Medications, such as antibiotics and steroids Uncontrolled diabetes Use of hygiene products such as bubble bath, vaginal spray or vaginal deodorant Douching Wearing damp or tight-fitting clothing Using an intrauterine device (IUD) for birth control Hormonal changes, such as those associated with pregnancy, birth control pills or menopause Sexual activity Having a sexually transmitted infection  Your treatment plan is Metronidazole or Flagyl 500mg  twice a day for 7 days.  I have electronically sent this prescription into the pharmacy that you have chosen.  Be sure to take all of the medication as directed. Stop taking any medication if you develop a rash, tongue swelling or shortness of breath. Mothers who are breast feeding should consider pumping and discarding their breast milk while on these antibiotics. However, there is no consensus that infant exposure at these doses would be harmful.  Remember that  medication creams can weaken latex condoms. SABRA   HOME CARE:  Good hygiene may prevent some types of vaginosis from recurring and may relieve some symptoms:  Avoid baths, hot tubs and whirlpool spas. Rinse soap from your outer genital area after a shower, and dry the area well to prevent irritation. Don't use scented or harsh soaps, such as those with deodorant or antibacterial action. Avoid irritants. These include scented tampons and pads. Wipe from front to back after using the toilet. Doing so avoids spreading fecal bacteria to your vagina.  Other things that may help prevent vaginosis include:  Don't douche. Your vagina doesn't require cleansing other than normal bathing. Repetitive douching disrupts the normal organisms that reside in the vagina and can actually increase your risk of vaginal infection. Douching won't clear up a vaginal infection. Use a latex condom. Both female and female latex condoms may help you avoid infections spread by sexual contact. Wear cotton underwear. Also wear pantyhose with a cotton crotch. If you feel comfortable without it, skip wearing underwear to bed. Yeast thrives in [employer] Your symptoms should improve in the next day or two.  GET HELP RIGHT AWAY IF:  You have pain in your lower abdomen ( pelvic area or over your ovaries) You develop nausea or vomiting You develop a fever Your discharge changes or worsens You have persistent pain with intercourse You develop shortness of breath, a rapid pulse, or you faint.  These symptoms could be signs of problems or infections that need to be evaluated by a medical provider now.  MAKE SURE YOU   Understand these instructions. Will watch your condition. Will get help right  away if you are not doing well or get worse.  Thank you for choosing an e-visit.  Your e-visit answers were reviewed by a board certified advanced clinical practitioner to complete your personal care plan. Depending upon the  condition, your plan could have included both over the counter or prescription medications.  Please review your pharmacy choice. Make sure the pharmacy is open so you can pick up prescription now. If there is a problem, you may contact your provider through [employer] and have the prescription routed to another pharmacy.  Your safety is important to us . If you have drug allergies check your prescription carefully.   For the next 24 hours you can use MyChart to ask questions about today's visit, request a non-urgent call back, or ask for a work or school excuse. You will get an email in the next two days asking about your experience. I hope that your e-visit has been valuable and will speed your recovery.  I have spent 5 minutes in review of e-visit questionnaire, review and updating patient chart, medical decision making and response to patient.   Delon CHRISTELLA Dickinson, PA-C

## 2024-01-11 ENCOUNTER — Other Ambulatory Visit (HOSPITAL_COMMUNITY): Payer: Self-pay

## 2024-01-11 DIAGNOSIS — Z7182 Exercise counseling: Secondary | ICD-10-CM | POA: Diagnosis not present

## 2024-01-11 DIAGNOSIS — Z713 Dietary counseling and surveillance: Secondary | ICD-10-CM | POA: Diagnosis not present

## 2024-01-11 DIAGNOSIS — Z6830 Body mass index (BMI) 30.0-30.9, adult: Secondary | ICD-10-CM | POA: Diagnosis not present

## 2024-01-11 DIAGNOSIS — E66811 Obesity, class 1: Secondary | ICD-10-CM | POA: Diagnosis not present

## 2024-01-11 DIAGNOSIS — E282 Polycystic ovarian syndrome: Secondary | ICD-10-CM | POA: Diagnosis not present

## 2024-01-11 NOTE — Telephone Encounter (Signed)
 Patient did an E-Visit on 09/01, medication was prescribed again

## 2024-01-12 ENCOUNTER — Other Ambulatory Visit: Payer: Self-pay

## 2024-01-12 ENCOUNTER — Other Ambulatory Visit (HOSPITAL_COMMUNITY): Payer: Self-pay

## 2024-01-12 MED ORDER — TIRZEPATIDE-WEIGHT MANAGEMENT 7.5 MG/0.5ML ~~LOC~~ SOAJ
7.5000 mg | SUBCUTANEOUS | 0 refills | Status: DC
  Filled 2024-01-12: qty 2, 28d supply, fill #0

## 2024-01-20 ENCOUNTER — Ambulatory Visit: Payer: Self-pay | Admitting: Family Medicine

## 2024-01-21 DIAGNOSIS — Z419 Encounter for procedure for purposes other than remedying health state, unspecified: Secondary | ICD-10-CM | POA: Diagnosis not present

## 2024-02-03 DIAGNOSIS — Z79899 Other long term (current) drug therapy: Secondary | ICD-10-CM | POA: Diagnosis not present

## 2024-02-03 DIAGNOSIS — Z3200 Encounter for pregnancy test, result unknown: Secondary | ICD-10-CM | POA: Diagnosis not present

## 2024-02-03 DIAGNOSIS — Z6831 Body mass index (BMI) 31.0-31.9, adult: Secondary | ICD-10-CM | POA: Diagnosis not present

## 2024-02-03 DIAGNOSIS — Z8742 Personal history of other diseases of the female genital tract: Secondary | ICD-10-CM | POA: Diagnosis not present

## 2024-02-03 DIAGNOSIS — M549 Dorsalgia, unspecified: Secondary | ICD-10-CM | POA: Diagnosis not present

## 2024-02-03 DIAGNOSIS — M4836 Traumatic spondylopathy, lumbar region: Secondary | ICD-10-CM | POA: Diagnosis not present

## 2024-02-03 DIAGNOSIS — M419 Scoliosis, unspecified: Secondary | ICD-10-CM | POA: Diagnosis not present

## 2024-02-07 ENCOUNTER — Other Ambulatory Visit (HOSPITAL_COMMUNITY): Payer: Self-pay

## 2024-02-07 DIAGNOSIS — E7800 Pure hypercholesterolemia, unspecified: Secondary | ICD-10-CM | POA: Diagnosis not present

## 2024-02-07 DIAGNOSIS — Z713 Dietary counseling and surveillance: Secondary | ICD-10-CM | POA: Diagnosis not present

## 2024-02-07 DIAGNOSIS — Z7182 Exercise counseling: Secondary | ICD-10-CM | POA: Diagnosis not present

## 2024-02-07 DIAGNOSIS — Z6829 Body mass index (BMI) 29.0-29.9, adult: Secondary | ICD-10-CM | POA: Diagnosis not present

## 2024-02-07 DIAGNOSIS — E282 Polycystic ovarian syndrome: Secondary | ICD-10-CM | POA: Diagnosis not present

## 2024-02-07 DIAGNOSIS — Z8639 Personal history of other endocrine, nutritional and metabolic disease: Secondary | ICD-10-CM | POA: Diagnosis not present

## 2024-02-07 DIAGNOSIS — E663 Overweight: Secondary | ICD-10-CM | POA: Diagnosis not present

## 2024-02-07 MED ORDER — ZEPBOUND 10 MG/0.5ML ~~LOC~~ SOAJ
0.5000 mL | SUBCUTANEOUS | 0 refills | Status: AC
  Filled 2024-02-07: qty 2, 28d supply, fill #0

## 2024-02-08 ENCOUNTER — Other Ambulatory Visit (HOSPITAL_COMMUNITY): Payer: Self-pay

## 2024-02-17 ENCOUNTER — Other Ambulatory Visit (HOSPITAL_COMMUNITY): Payer: Self-pay

## 2024-03-07 DIAGNOSIS — M4836 Traumatic spondylopathy, lumbar region: Secondary | ICD-10-CM | POA: Diagnosis not present

## 2024-03-07 DIAGNOSIS — Z79899 Other long term (current) drug therapy: Secondary | ICD-10-CM | POA: Diagnosis not present

## 2024-03-07 DIAGNOSIS — Z6831 Body mass index (BMI) 31.0-31.9, adult: Secondary | ICD-10-CM | POA: Diagnosis not present

## 2024-03-07 DIAGNOSIS — Z8742 Personal history of other diseases of the female genital tract: Secondary | ICD-10-CM | POA: Diagnosis not present

## 2024-03-07 DIAGNOSIS — M419 Scoliosis, unspecified: Secondary | ICD-10-CM | POA: Diagnosis not present

## 2024-03-07 DIAGNOSIS — M549 Dorsalgia, unspecified: Secondary | ICD-10-CM | POA: Diagnosis not present

## 2024-03-16 ENCOUNTER — Telehealth: Admitting: Physician Assistant

## 2024-03-16 DIAGNOSIS — N760 Acute vaginitis: Secondary | ICD-10-CM | POA: Diagnosis not present

## 2024-03-16 DIAGNOSIS — B3731 Acute candidiasis of vulva and vagina: Secondary | ICD-10-CM | POA: Diagnosis not present

## 2024-03-16 DIAGNOSIS — B9689 Other specified bacterial agents as the cause of diseases classified elsewhere: Secondary | ICD-10-CM | POA: Diagnosis not present

## 2024-03-16 MED ORDER — FLUCONAZOLE 150 MG PO TABS: 150.0000 mg | ORAL_TABLET | ORAL | 0 refills | Status: AC | PRN

## 2024-03-16 MED ORDER — METRONIDAZOLE 500 MG PO TABS: 500.0000 mg | ORAL_TABLET | Freq: Two times a day (BID) | ORAL | 0 refills | Status: AC

## 2024-03-16 NOTE — Progress Notes (Signed)
 We are sorry that you are not feeling well. Here is how we plan to help! Based on what you shared with me it looks like you: May have a vaginosis due to bacteria  Vaginosis is an inflammation of the vagina that can result in discharge, itching and pain. The cause is usually a change in the normal balance of vaginal bacteria or an infection. Vaginosis can also result from reduced estrogen levels after menopause.  The most common causes of vaginosis are:   Bacterial vaginosis which results from an overgrowth of one on several organisms that are normally present in your vagina.   Yeast infections which are caused by a naturally occurring fungus called candida.   Vaginal atrophy (atrophic vaginosis) which results from the thinning of the vagina from reduced estrogen levels after menopause.   Trichomoniasis which is caused by a parasite and is commonly transmitted by sexual intercourse.  Factors that increase your risk of developing vaginosis include: Medications, such as antibiotics and steroids Uncontrolled diabetes Use of hygiene products such as bubble bath, vaginal spray or vaginal deodorant Douching Wearing damp or tight-fitting clothing Using an intrauterine device (IUD) for birth control Hormonal changes, such as those associated with pregnancy, birth control pills or menopause Sexual activity Having a sexually transmitted infection  Your treatment plan is Metronidazole  or Flagyl  500mg  twice a day for 7 days.  I have electronically sent this prescription into the pharmacy that you have chosen. I have also prescribed Fluconazole  150mg  for possible yeast.   Be sure to take all of the medication as directed. Stop taking any medication if you develop a rash, tongue swelling or shortness of breath. Mothers who are breast feeding should consider pumping and discarding their breast milk while on these antibiotics. However, there is no consensus that infant exposure at these doses would be  harmful.  Remember that medication creams can weaken latex condoms.   HOME CARE:  Good hygiene may prevent some types of vaginosis from recurring and may relieve some symptoms:  Avoid baths, hot tubs and whirlpool spas. Rinse soap from your outer genital area after a shower, and dry the area well to prevent irritation. Don't use scented or harsh soaps, such as those with deodorant or antibacterial action. Avoid irritants. These include scented tampons and pads. Wipe from front to back after using the toilet. Doing so avoids spreading fecal bacteria to your vagina.  Other things that may help prevent vaginosis include:  Don't douche. Your vagina doesn't require cleansing other than normal bathing. Repetitive douching disrupts the normal organisms that reside in the vagina and can actually increase your risk of vaginal infection. Douching won't clear up a vaginal infection. Use a latex condom. Both female and female latex condoms may help you avoid infections spread by sexual contact. Wear cotton underwear. Also wear pantyhose with a cotton crotch. If you feel comfortable without it, skip wearing underwear to bed. Yeast thrives in [place] [facility] Your symptoms should improve in the next day or two.  GET HELP RIGHT AWAY IF:  You have pain in your lower abdomen ( pelvic area or over your ovaries) You develop nausea or vomiting You develop a fever Your discharge changes or worsens You have persistent pain with intercourse You develop shortness of breath, a rapid pulse, or you faint.  These symptoms could be signs of problems or infections that need to be evaluated by a medical provider now.  MAKE SURE YOU   Understand these instructions. Will watch your condition.  Will get help right away if you are not doing well or get worse.  Your e-visit answers were reviewed by a board certified advanced clinical practitioner to complete your personal care plan. Depending upon the condition, your  plan could have included both over the counter or prescription medications. Please review your pharmacy choice to make sure that you have choses a pharmacy that is open for you to pick up any needed prescription, Your safety is important to us . If you have drug allergies check your prescription carefully.   You can use MyChart to ask questions about today's visit, request a non-urgent call back, or ask for a work or school excuse for 24 hours related to this e-Visit. If it has been greater than 24 hours you will need to follow up with your provider, or enter a new e-Visit to address those concerns. You will get a MyChart message within the next two days asking about your experience. I hope that your e-visit has been valuable and will speed your recovery.  I have spent 5 minutes in review of e-visit questionnaire, review and updating patient chart, medical decision making and response to patient.   Delon CHRISTELLA Dickinson, PA-C

## 2024-04-07 DIAGNOSIS — Z8742 Personal history of other diseases of the female genital tract: Secondary | ICD-10-CM | POA: Diagnosis not present

## 2024-04-07 DIAGNOSIS — Z79899 Other long term (current) drug therapy: Secondary | ICD-10-CM | POA: Diagnosis not present

## 2024-04-07 DIAGNOSIS — M4836 Traumatic spondylopathy, lumbar region: Secondary | ICD-10-CM | POA: Diagnosis not present

## 2024-04-07 DIAGNOSIS — M549 Dorsalgia, unspecified: Secondary | ICD-10-CM | POA: Diagnosis not present

## 2024-04-07 DIAGNOSIS — E66811 Obesity, class 1: Secondary | ICD-10-CM | POA: Diagnosis not present

## 2024-04-07 DIAGNOSIS — M419 Scoliosis, unspecified: Secondary | ICD-10-CM | POA: Diagnosis not present

## 2024-04-09 ENCOUNTER — Telehealth: Admitting: Family

## 2024-04-09 DIAGNOSIS — N760 Acute vaginitis: Secondary | ICD-10-CM

## 2024-04-09 DIAGNOSIS — B9689 Other specified bacterial agents as the cause of diseases classified elsewhere: Secondary | ICD-10-CM

## 2024-04-09 DIAGNOSIS — N898 Other specified noninflammatory disorders of vagina: Secondary | ICD-10-CM

## 2024-04-09 MED ORDER — METRONIDAZOLE 500 MG PO TABS: 500.0000 mg | ORAL_TABLET | Freq: Two times a day (BID) | ORAL | 0 refills | Status: AC

## 2024-04-09 NOTE — Addendum Note (Signed)
 Addended by: LAVELL LYE A on: 04/09/2024 09:16 AM   Modules accepted: Orders, Level of Service

## 2024-04-09 NOTE — Progress Notes (Signed)
 I will treat today, but any recurrent symptoms need to be seen in person.   We are sorry that you are not feeling well. Here is how we plan to help! Based on what you shared with me it looks like you: May have a vaginosis due to bacteria  Vaginosis is an inflammation of the vagina that can result in discharge, itching and pain. The cause is usually a change in the normal balance of vaginal bacteria or an infection. Vaginosis can also result from reduced estrogen levels after menopause.  The most common causes of vaginosis are:   Bacterial vaginosis which results from an overgrowth of one on several organisms that are normally present in your vagina.   Yeast infections which are caused by a naturally occurring fungus called candida.   Vaginal atrophy (atrophic vaginosis) which results from the thinning of the vagina from reduced estrogen levels after menopause.   Trichomoniasis which is caused by a parasite and is commonly transmitted by sexual intercourse.  Factors that increase your risk of developing vaginosis include: Medications, such as antibiotics and steroids Uncontrolled diabetes Use of hygiene products such as bubble bath, vaginal spray or vaginal deodorant Douching Wearing damp or tight-fitting clothing Using an intrauterine device (IUD) for birth control Hormonal changes, such as those associated with pregnancy, birth control pills or menopause Sexual activity Having a sexually transmitted infection  Your treatment plan is Metronidazole  or Flagyl  500mg  twice a day for 7 days.  I have electronically sent this prescription into the pharmacy that you have chosen.  Be sure to take all of the medication as directed. Stop taking any medication if you develop a rash, tongue swelling or shortness of breath. Mothers who are breast feeding should consider pumping and discarding their breast milk while on these antibiotics. However, there is no consensus that infant exposure at these  doses would be harmful.  Remember that medication creams can weaken latex condoms.   HOME CARE:  Good hygiene may prevent some types of vaginosis from recurring and may relieve some symptoms:  Avoid baths, hot tubs and whirlpool spas. Rinse soap from your outer genital area after a shower, and dry the area well to prevent irritation. Don't use scented or harsh soaps, such as those with deodorant or antibacterial action. Avoid irritants. These include scented tampons and pads. Wipe from front to back after using the toilet. Doing so avoids spreading fecal bacteria to your vagina.  Other things that may help prevent vaginosis include:  Don't douche. Your vagina doesn't require cleansing other than normal bathing. Repetitive douching disrupts the normal organisms that reside in the vagina and can actually increase your risk of vaginal infection. Douching won't clear up a vaginal infection. Use a latex condom. Both female and female latex condoms may help you avoid infections spread by sexual contact. Wear cotton underwear. Also wear pantyhose with a cotton crotch. If you feel comfortable without it, skip wearing underwear to bed. Yeast thrives in [place] Your symptoms should improve in the next day or two.  GET HELP RIGHT AWAY IF:  You have pain in your lower abdomen ( pelvic area or over your ovaries) You develop nausea or vomiting You develop a fever Your discharge changes or worsens You have persistent pain with intercourse You develop shortness of breath, a rapid pulse, or you faint.  These symptoms could be signs of problems or infections that need to be evaluated by a medical provider now.  MAKE SURE YOU   Understand  these instructions. Will watch your condition. Will get help right away if you are not doing well or get worse.  Your e-visit answers were reviewed by a board certified advanced clinical practitioner to complete your personal care plan. Depending upon the  condition, your plan could have included both over the counter or prescription medications. Please review your pharmacy choice to make sure that you have choses a pharmacy that is open for you to pick up any needed prescription, Your safety is important to us . If you have drug allergies check your prescription carefully.   You can use MyChart to ask questions about today's visit, request a non-urgent call back, or ask for a work or school excuse for 24 hours related to this e-Visit. If it has been greater than 24 hours you will need to follow up with your provider, or enter a new e-Visit to address those concerns. You will get a MyChart message within the next two days asking about your experience. I hope that your e-visit has been valuable and will speed your recovery.  I have spent 5 minutes in review of e-visit questionnaire, review and updating patient chart, medical decision making and response to patient.   Bari Learn, FNP

## 2024-04-09 NOTE — Progress Notes (Signed)
  Because you have recently been treated for vaginal discharge on 03/16/24, I feel your condition warrants further evaluation and I recommend that you be seen in a face-to-face visit.   NOTE: There will be NO CHARGE for this E-Visit   If you are having a true medical emergency, please call 911.     For an urgent face to face visit, [place] has multiple urgent care centers for your convenience.  Click the link below for the full list of locations and hours, walk-in wait times, appointment scheduling options and driving directions:  Urgent Care - [place], [place], [place], [place], [place], [place]  [place]     Your MyChart E-visit questionnaire answers were reviewed by a board certified advanced clinical practitioner to complete your personal care plan based on your specific symptoms.    Thank you for using e-Visits.

## 2024-04-21 DIAGNOSIS — Z419 Encounter for procedure for purposes other than remedying health state, unspecified: Secondary | ICD-10-CM | POA: Diagnosis not present

## 2024-05-08 DIAGNOSIS — M419 Scoliosis, unspecified: Secondary | ICD-10-CM | POA: Diagnosis not present

## 2024-05-08 DIAGNOSIS — Z79899 Other long term (current) drug therapy: Secondary | ICD-10-CM | POA: Diagnosis not present

## 2024-05-08 DIAGNOSIS — E66811 Obesity, class 1: Secondary | ICD-10-CM | POA: Diagnosis not present

## 2024-05-08 DIAGNOSIS — M549 Dorsalgia, unspecified: Secondary | ICD-10-CM | POA: Diagnosis not present

## 2024-05-08 DIAGNOSIS — Z8742 Personal history of other diseases of the female genital tract: Secondary | ICD-10-CM | POA: Diagnosis not present

## 2024-05-08 DIAGNOSIS — M4836 Traumatic spondylopathy, lumbar region: Secondary | ICD-10-CM | POA: Diagnosis not present

## 2024-05-17 ENCOUNTER — Ambulatory Visit (INDEPENDENT_AMBULATORY_CARE_PROVIDER_SITE_OTHER)

## 2024-05-17 ENCOUNTER — Ambulatory Visit: Admission: EM | Admit: 2024-05-17 | Discharge: 2024-05-17 | Disposition: A | Discharge status: Home / Self Care

## 2024-05-17 ENCOUNTER — Encounter: Payer: Self-pay | Admitting: Emergency Medicine

## 2024-05-17 DIAGNOSIS — B349 Viral infection, unspecified: Secondary | ICD-10-CM

## 2024-05-17 DIAGNOSIS — R0989 Other specified symptoms and signs involving the circulatory and respiratory systems: Secondary | ICD-10-CM | POA: Diagnosis not present

## 2024-05-17 LAB — POCT RAPID STREP A (OFFICE): Rapid Strep A Screen: NEGATIVE

## 2024-05-17 MED ORDER — AZELASTINE HCL 0.1 % NA SOLN: 1.0000 | Freq: Two times a day (BID) | NASAL | 1 refills | Status: AC

## 2024-05-17 MED ORDER — PROMETHAZINE-DM 6.25-15 MG/5ML PO SYRP: 10.0000 mL | ORAL_SOLUTION | Freq: Three times a day (TID) | ORAL | 0 refills | Status: AC | PRN

## 2024-05-17 NOTE — ED Provider Notes (Signed)
 " [facility]  Note:  This document was prepared using Dragon voice recognition software and may include unintentional dictation errors.  MRN: [id number] DOB: 01/18/1991  Subjective:   Anita Allen is a 34 y.o. female presenting for cough, nasal congestion, chest congestion x 6 days.  Patient also reports sore throat headache, body aches.  Patient reports she has been taking over-the-counter medication with minimal improvement.  Denies any known sick contacts.  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  Patient reports mild production with cough occasionally with green sputum.  Denies any vomiting, diarrhea, chest pain, shortness of breath, weakness, dizziness.  Current Medications[1]   Allergies[2]  Past Medical History:  Diagnosis Date   BV (bacterial vaginosis)    Gestational diabetes    Gestational diabetes mellitus (GDM), antepartum 07/09/2022   [facility] Guidelines for Antenatal Testing and Sonography                                                         Updated  03/03/2022 with Dr. Fredia Fresh           INDICATION    Growth U/S    BPP weekly    DELIVERY RECOMMENDATION (GA)      Diabetes    A1 - good control        A2 - good control        A2  - poor control or poor compliance     (Macrosomia or polyhydramnios)        A2/B and B-C Prege   Gonorrhea contact, treated    H/O dilation and curettage    Headache    Hx of trichomoniasis    Molar pregnancy 12/2013   Ovarian cyst    PCOS (polycystic ovarian syndrome)    Severe pre-eclampsia 09/01/2022   UTI (urinary tract infection)    Vaginal delivery 2012, 2013   Vaginal Pap smear, abnormal    Yeast infection      Past Surgical History:  Procedure Laterality Date   brazillian butt lift     DILATION AND CURETTAGE OF UTERUS     DILATION AND EVACUATION N/A 12/20/2013   Procedure: DILATATION AND EVACUATION;  Surgeon: Lynwood KANDICE Solomons, MD;  Location: [facility];  Service: Gynecology;   Laterality: N/A;   LIPOSUCTION TRUNK  2021    Family History  Problem Relation Age of Onset   Hypertension Mother    Heart attack Mother    Thyroid  disease Mother    Heart disease Father    Hypertension Father    Heart attack Father     Social History[3]  ROS Refer to HPI for ROS details.  Objective:   Vitals: BP 117/79 (BP Location: Left Arm)   Pulse 82   Temp 99.4 F (37.4 C) (Oral)   Resp 18   Wt 169 lb 15.6 oz (77.1 kg)   LMP 05/06/2024 (Exact Date)   SpO2 97%   Breastfeeding No   BMI 30.59 kg/m   Physical Exam Vitals and nursing note reviewed.  Constitutional:      General: She is not in acute distress.    Appearance: Normal appearance. She is well-developed. She is not ill-appearing or toxic-appearing.  HENT:     Head: Normocephalic and atraumatic.  Nose: Congestion present.     Mouth/Throat:     Mouth: Mucous membranes are moist.     Pharynx: Oropharynx is clear. No oropharyngeal exudate or posterior oropharyngeal erythema.  Cardiovascular:     Rate and Rhythm: Normal rate.  Pulmonary:     Effort: Pulmonary effort is normal. No respiratory distress.     Breath sounds: No stridor. No wheezing.  Chest:     Chest wall: No tenderness.  Abdominal:     Palpations: Abdomen is soft.     Tenderness: There is no abdominal tenderness. There is no right CVA tenderness or left CVA tenderness.  Skin:    General: Skin is warm and dry.  Neurological:     General: No focal deficit present.     Mental Status: She is alert and oriented to person, place, and time.  Psychiatric:        Mood and Affect: Mood normal.        Behavior: Behavior normal.     Procedures  Results for orders placed or performed during the hospital encounter of 05/17/24 (from the past 24 hours)  POCT rapid strep A     Status: Normal   Collection Time: 05/17/24  3:03 PM  Result Value Ref Range   Rapid Strep A Screen Negative Negative    DG Chest 2 View Result Date:  05/17/2024 CLINICAL DATA:  Chest congestion. EXAM: CHEST - 2 VIEW COMPARISON:  04/10/2021 FINDINGS: Lungs are clear on the frontal view. Slightly increased densities in the anterior chest on the lateral view but this could be related to overlying lung markings. No pleural effusions. No acute bone abnormality. Heart size is normal and stable. Trachea is midline. IMPRESSION: 1. No acute cardiopulmonary disease. 2. Slightly increased densities in the anterior lung on the lateral view but this could be related to overlying lung structures and atelectasis. Electronically Signed   By: Juliene Balder M.D.   On: 05/17/2024 15:14     Assessment and Plan :     Discharge Instructions       1. Acute viral syndrome (Primary) - POCT rapid strep A completed today in UC is negative for strep pharyngitis - DG Chest 2 View completed today in UC shows no acute cardiopulmonary processes, no sign of consolidation or pneumonia. - azelastine  (ASTELIN ) 0.1 % nasal spray; Place 1 spray into both nostrils 2 (two) times daily. Use in each nostril as directed  Dispense: 30 mL; Refill: 1 - promethazine -dextromethorphan (PROMETHAZINE -DM) 6.25-15 MG/5ML syrup; Take 10 mLs by mouth 3 (three) times daily as needed.  Dispense: 240 mL; Refill: 0  -Continue to monitor symptoms for any change in severity if there is any escalation of current symptoms or development of new symptoms follow-up in ER for further evaluation and management.       Armarion Greek B Meri Pelot    [1] No current facility-administered medications for this encounter.  Current Outpatient Medications:    azelastine  (ASTELIN ) 0.1 % nasal spray, Place 1 spray into both nostrils 2 (two) times daily. Use in each nostril as directed, Disp: 30 mL, Rfl: 1   linaclotide  (LINZESS ) 145 MCG CAPS capsule, Take 145 mcg by mouth., Disp: , Rfl:    naloxone (NARCAN) nasal spray 4 mg/0.1 mL, , Disp: , Rfl:    promethazine -dextromethorphan (PROMETHAZINE -DM) 6.25-15 MG/5ML syrup,  Take 10 mLs by mouth 3 (three) times daily as needed., Disp: 240 mL, Rfl: 0   Vitamin D, Ergocalciferol, (DRISDOL) 1.25 MG (50000 UNIT) CAPS capsule, Take 50,000  Units by mouth., Disp: , Rfl:    fluconazole  (DIFLUCAN ) 150 MG tablet, Take 1 tablet (150 mg total) by mouth every 3 (three) days as needed., Disp: 2 tablet, Rfl: 0   Oxycodone  HCl 10 MG TABS, Take 10 mg by mouth 4 (four) times daily as needed., Disp: , Rfl:    oxyCODONE -acetaminophen  (PERCOCET) 10-325 MG tablet, Take 1 tablet by mouth 4 (four) times daily as needed., Disp: , Rfl:    tirzepatide  (ZEPBOUND ) 10 MG/0.5ML Pen, Inject 10 mg into the skin once a week., Disp: 2 mL, Rfl: 0 [2]  Allergies Allergen Reactions   Augmentin [Amoxicillin-Pot Clavulanate] Hives and Other (See Comments)    Pt has taken Ceftriaxone  in the past (??)   Latex Rash    Vaginal irritation with condoms  [3]  Social History Tobacco Use   Smoking status: Never    Passive exposure: Current   Smokeless tobacco: Never   Tobacco comments:    Boyfriend, In an outside.  Vaping Use   Vaping status: Never Used  Substance Use Topics   Alcohol use: Not Currently    Comment: not often, not since confirmed pregnancy   Drug use: No     Aurea Goodell B, NP 05/17/24 1550  "

## 2024-05-17 NOTE — Discharge Instructions (Addendum)
" °  1. Acute viral syndrome (Primary) - POCT rapid strep A completed today in UC is negative for strep pharyngitis - DG Chest 2 View completed today in UC shows no acute cardiopulmonary processes, no sign of consolidation or pneumonia. - azelastine  (ASTELIN ) 0.1 % nasal spray; Place 1 spray into both nostrils 2 (two) times daily. Use in each nostril as directed  Dispense: 30 mL; Refill: 1 - promethazine -dextromethorphan (PROMETHAZINE -DM) 6.25-15 MG/5ML syrup; Take 10 mLs by mouth 3 (three) times daily as needed.  Dispense: 240 mL; Refill: 0  -Continue to monitor symptoms for any change in severity if there is any escalation of current symptoms or development of new symptoms follow-up in ER for further evaluation and management. "

## 2024-05-17 NOTE — ED Triage Notes (Signed)
 Pt presents c/o cough and chest congestion x 6 days. Pt states,  I have a cough and a sore throat. I had more sxs earlier this week but I treated it at home so the sxs improved. The cough is productive and the mucus is like a neon green color. The cough gets worse over night and comes with a little chest pain.  Pt denies emesis and diarrhea.

## 2024-05-24 ENCOUNTER — Other Ambulatory Visit (HOSPITAL_COMMUNITY): Payer: Self-pay

## 2024-05-24 ENCOUNTER — Other Ambulatory Visit: Payer: Self-pay

## 2024-05-24 MED ORDER — ZEPBOUND 2.5 MG/0.5ML ~~LOC~~ SOAJ
2.5000 mg | SUBCUTANEOUS | 0 refills | Status: AC
  Filled 2024-05-24 – 2024-05-25 (×2): qty 2, 28d supply, fill #0

## 2024-05-24 MED ORDER — ZEPBOUND 5 MG/0.5ML ~~LOC~~ SOAJ: 5.0000 mg | SUBCUTANEOUS | 0 refills | Status: AC

## 2024-05-25 ENCOUNTER — Other Ambulatory Visit: Payer: Self-pay

## 2024-05-25 ENCOUNTER — Encounter (HOSPITAL_COMMUNITY): Payer: Self-pay

## 2024-05-25 ENCOUNTER — Other Ambulatory Visit (HOSPITAL_COMMUNITY): Payer: Self-pay

## 2024-05-25 MED ORDER — RELUGOLIX 120 MG PO TABS: ORAL_TABLET | ORAL | 4 refills | Status: DC

## 2024-05-26 ENCOUNTER — Other Ambulatory Visit (HOSPITAL_COMMUNITY): Payer: Self-pay

## 2024-05-29 ENCOUNTER — Other Ambulatory Visit (HOSPITAL_COMMUNITY): Payer: Self-pay

## 2024-06-02 ENCOUNTER — Ambulatory Visit
